# Patient Record
Sex: Female | Born: 1935
Health system: Southern US, Community
[De-identification: ages and names within clinical notes are randomized; demographics above are authoritative.]

## PROBLEM LIST (undated history)

## (undated) DIAGNOSIS — C801 Malignant (primary) neoplasm, unspecified: Secondary | ICD-10-CM

## (undated) DIAGNOSIS — E079 Disorder of thyroid, unspecified: Secondary | ICD-10-CM

## (undated) DIAGNOSIS — C50919 Malignant neoplasm of unspecified site of unspecified female breast: Secondary | ICD-10-CM

## (undated) DIAGNOSIS — H9190 Unspecified hearing loss, unspecified ear: Secondary | ICD-10-CM

## (undated) DIAGNOSIS — IMO0001 Reserved for inherently not codable concepts without codable children: Secondary | ICD-10-CM

## (undated) DIAGNOSIS — H409 Unspecified glaucoma: Secondary | ICD-10-CM

## (undated) DIAGNOSIS — I10 Essential (primary) hypertension: Secondary | ICD-10-CM

## (undated) DIAGNOSIS — I4891 Unspecified atrial fibrillation: Secondary | ICD-10-CM

## (undated) DIAGNOSIS — M8580 Other specified disorders of bone density and structure, unspecified site: Secondary | ICD-10-CM

## (undated) HISTORY — DX: Other specified disorders of bone density and structure, unspecified site: M85.80

## (undated) HISTORY — PX: TONSILLECTOMY: SUR1361

## (undated) HISTORY — PX: BREAST LUMPECTOMY: SHX2

## (undated) HISTORY — DX: Essential (primary) hypertension: I10

## (undated) HISTORY — DX: Disorder of thyroid, unspecified: E07.9

## (undated) HISTORY — DX: Reserved for inherently not codable concepts without codable children: IMO0001

## (undated) HISTORY — DX: Unspecified glaucoma: H40.9

## (undated) HISTORY — DX: Unspecified hearing loss, unspecified ear: H91.90

## (undated) HISTORY — DX: Malignant (primary) neoplasm, unspecified: C80.1

## (undated) HISTORY — PX: BREAST EXCISIONAL BIOPSY: SUR124

## (undated) HISTORY — PX: OTHER SURGICAL HISTORY: SHX169

## (undated) HISTORY — DX: Unspecified atrial fibrillation: I48.91

## (undated) HISTORY — PX: THYROID SURGERY: SHX805

## (undated) HISTORY — PX: APPENDECTOMY: SHX54

---

## 1987-05-26 HISTORY — PX: BREAST SURGERY: SHX581

## 1998-01-30 ENCOUNTER — Other Ambulatory Visit: Admission: RE | Admit: 1998-01-30 | Discharge: 1998-01-30 | Payer: Self-pay | Admitting: Gynecology

## 1999-01-02 ENCOUNTER — Other Ambulatory Visit: Admission: RE | Admit: 1999-01-02 | Discharge: 1999-01-02 | Payer: Self-pay | Admitting: Gynecology

## 1999-07-25 ENCOUNTER — Encounter: Payer: Self-pay | Admitting: Geriatric Medicine

## 1999-07-25 ENCOUNTER — Encounter: Admission: RE | Admit: 1999-07-25 | Discharge: 1999-07-25 | Payer: Self-pay | Admitting: Geriatric Medicine

## 1999-10-06 ENCOUNTER — Encounter: Payer: Self-pay | Admitting: Geriatric Medicine

## 1999-10-06 ENCOUNTER — Encounter: Admission: RE | Admit: 1999-10-06 | Discharge: 1999-10-06 | Payer: Self-pay | Admitting: Geriatric Medicine

## 1999-12-31 ENCOUNTER — Other Ambulatory Visit: Admission: RE | Admit: 1999-12-31 | Discharge: 1999-12-31 | Payer: Self-pay | Admitting: Gynecology

## 2000-01-05 ENCOUNTER — Encounter: Admission: RE | Admit: 2000-01-05 | Discharge: 2000-01-05 | Payer: Self-pay | Admitting: Gynecology

## 2000-01-05 ENCOUNTER — Encounter: Payer: Self-pay | Admitting: Gynecology

## 2000-10-13 ENCOUNTER — Encounter: Payer: Self-pay | Admitting: Geriatric Medicine

## 2000-10-13 ENCOUNTER — Encounter: Admission: RE | Admit: 2000-10-13 | Discharge: 2000-10-13 | Payer: Self-pay | Admitting: Geriatric Medicine

## 2001-01-04 ENCOUNTER — Other Ambulatory Visit: Admission: RE | Admit: 2001-01-04 | Discharge: 2001-01-04 | Payer: Self-pay | Admitting: Gynecology

## 2001-09-28 ENCOUNTER — Encounter: Admission: RE | Admit: 2001-09-28 | Discharge: 2001-09-28 | Payer: Self-pay | Admitting: Geriatric Medicine

## 2001-09-28 ENCOUNTER — Encounter: Payer: Self-pay | Admitting: Geriatric Medicine

## 2002-02-08 ENCOUNTER — Other Ambulatory Visit: Admission: RE | Admit: 2002-02-08 | Discharge: 2002-02-08 | Payer: Self-pay | Admitting: Gynecology

## 2002-10-02 ENCOUNTER — Encounter: Payer: Self-pay | Admitting: Geriatric Medicine

## 2002-10-02 ENCOUNTER — Encounter: Admission: RE | Admit: 2002-10-02 | Discharge: 2002-10-02 | Payer: Self-pay | Admitting: Geriatric Medicine

## 2003-10-03 ENCOUNTER — Ambulatory Visit (HOSPITAL_COMMUNITY): Admission: RE | Admit: 2003-10-03 | Discharge: 2003-10-03 | Payer: Self-pay | Admitting: Geriatric Medicine

## 2004-02-25 ENCOUNTER — Other Ambulatory Visit: Admission: RE | Admit: 2004-02-25 | Discharge: 2004-02-25 | Payer: Self-pay | Admitting: Gynecology

## 2004-10-03 ENCOUNTER — Ambulatory Visit (HOSPITAL_COMMUNITY): Admission: RE | Admit: 2004-10-03 | Discharge: 2004-10-03 | Payer: Self-pay | Admitting: Gynecology

## 2005-10-07 ENCOUNTER — Ambulatory Visit (HOSPITAL_COMMUNITY): Admission: RE | Admit: 2005-10-07 | Discharge: 2005-10-07 | Payer: Self-pay | Admitting: Gynecology

## 2006-03-10 ENCOUNTER — Other Ambulatory Visit: Admission: RE | Admit: 2006-03-10 | Discharge: 2006-03-10 | Payer: Self-pay | Admitting: Gynecology

## 2006-10-11 ENCOUNTER — Ambulatory Visit (HOSPITAL_COMMUNITY): Admission: RE | Admit: 2006-10-11 | Discharge: 2006-10-11 | Payer: Self-pay | Admitting: Geriatric Medicine

## 2007-10-12 ENCOUNTER — Ambulatory Visit (HOSPITAL_COMMUNITY): Admission: RE | Admit: 2007-10-12 | Discharge: 2007-10-12 | Payer: Self-pay | Admitting: Geriatric Medicine

## 2008-10-12 ENCOUNTER — Ambulatory Visit (HOSPITAL_COMMUNITY): Admission: RE | Admit: 2008-10-12 | Discharge: 2008-10-12 | Payer: Self-pay | Admitting: Geriatric Medicine

## 2009-10-14 ENCOUNTER — Ambulatory Visit (HOSPITAL_COMMUNITY): Admission: RE | Admit: 2009-10-14 | Discharge: 2009-10-14 | Payer: Self-pay | Admitting: Gynecology

## 2010-09-09 ENCOUNTER — Other Ambulatory Visit (HOSPITAL_COMMUNITY): Payer: Self-pay | Admitting: Geriatric Medicine

## 2010-09-09 DIAGNOSIS — Z1231 Encounter for screening mammogram for malignant neoplasm of breast: Secondary | ICD-10-CM

## 2010-10-17 ENCOUNTER — Ambulatory Visit (HOSPITAL_COMMUNITY)
Admission: RE | Admit: 2010-10-17 | Discharge: 2010-10-17 | Disposition: A | Discharge status: Home / Self Care | Payer: Medicare Other | Source: Ambulatory Visit | Attending: Geriatric Medicine | Admitting: Geriatric Medicine

## 2010-10-17 DIAGNOSIS — Z1231 Encounter for screening mammogram for malignant neoplasm of breast: Secondary | ICD-10-CM | POA: Insufficient documentation

## 2011-07-08 DIAGNOSIS — Z79899 Other long term (current) drug therapy: Secondary | ICD-10-CM | POA: Diagnosis not present

## 2011-07-08 DIAGNOSIS — I10 Essential (primary) hypertension: Secondary | ICD-10-CM | POA: Diagnosis not present

## 2011-07-08 DIAGNOSIS — E039 Hypothyroidism, unspecified: Secondary | ICD-10-CM | POA: Diagnosis not present

## 2011-09-16 ENCOUNTER — Other Ambulatory Visit (HOSPITAL_COMMUNITY): Payer: Self-pay | Admitting: Geriatric Medicine

## 2011-09-16 DIAGNOSIS — Z1231 Encounter for screening mammogram for malignant neoplasm of breast: Secondary | ICD-10-CM

## 2011-10-21 ENCOUNTER — Ambulatory Visit (HOSPITAL_COMMUNITY)
Admission: RE | Admit: 2011-10-21 | Discharge: 2011-10-21 | Disposition: A | Discharge status: Home / Self Care | Payer: Medicare Other | Source: Ambulatory Visit | Attending: Geriatric Medicine | Admitting: Geriatric Medicine

## 2011-10-21 DIAGNOSIS — Z1231 Encounter for screening mammogram for malignant neoplasm of breast: Secondary | ICD-10-CM | POA: Insufficient documentation

## 2012-01-08 DIAGNOSIS — H903 Sensorineural hearing loss, bilateral: Secondary | ICD-10-CM | POA: Diagnosis not present

## 2012-01-13 DIAGNOSIS — Z1331 Encounter for screening for depression: Secondary | ICD-10-CM | POA: Diagnosis not present

## 2012-01-13 DIAGNOSIS — G479 Sleep disorder, unspecified: Secondary | ICD-10-CM | POA: Diagnosis not present

## 2012-01-13 DIAGNOSIS — E039 Hypothyroidism, unspecified: Secondary | ICD-10-CM | POA: Diagnosis not present

## 2012-01-13 DIAGNOSIS — Z0000 Encounter for general adult medical examination without abnormal findings: Secondary | ICD-10-CM | POA: Diagnosis not present

## 2012-01-13 DIAGNOSIS — M79609 Pain in unspecified limb: Secondary | ICD-10-CM | POA: Diagnosis not present

## 2012-01-13 DIAGNOSIS — Z79899 Other long term (current) drug therapy: Secondary | ICD-10-CM | POA: Diagnosis not present

## 2012-03-23 DIAGNOSIS — Z01419 Encounter for gynecological examination (general) (routine) without abnormal findings: Secondary | ICD-10-CM | POA: Diagnosis not present

## 2012-03-23 DIAGNOSIS — N8184 Pelvic muscle wasting: Secondary | ICD-10-CM | POA: Diagnosis not present

## 2012-03-23 DIAGNOSIS — Z23 Encounter for immunization: Secondary | ICD-10-CM | POA: Diagnosis not present

## 2012-07-13 DIAGNOSIS — L989 Disorder of the skin and subcutaneous tissue, unspecified: Secondary | ICD-10-CM | POA: Diagnosis not present

## 2012-07-13 DIAGNOSIS — I10 Essential (primary) hypertension: Secondary | ICD-10-CM | POA: Diagnosis not present

## 2012-07-13 DIAGNOSIS — Z79899 Other long term (current) drug therapy: Secondary | ICD-10-CM | POA: Diagnosis not present

## 2012-09-14 ENCOUNTER — Other Ambulatory Visit (HOSPITAL_COMMUNITY): Payer: Self-pay | Admitting: Geriatric Medicine

## 2012-09-14 DIAGNOSIS — Z1231 Encounter for screening mammogram for malignant neoplasm of breast: Secondary | ICD-10-CM

## 2012-10-21 ENCOUNTER — Ambulatory Visit (HOSPITAL_COMMUNITY)
Admission: RE | Admit: 2012-10-21 | Discharge: 2012-10-21 | Disposition: A | Discharge status: Home / Self Care | Payer: Medicare Other | Source: Ambulatory Visit | Attending: Geriatric Medicine | Admitting: Geriatric Medicine

## 2012-10-21 DIAGNOSIS — Z1231 Encounter for screening mammogram for malignant neoplasm of breast: Secondary | ICD-10-CM | POA: Diagnosis not present

## 2012-12-22 DIAGNOSIS — H3530 Unspecified macular degeneration: Secondary | ICD-10-CM | POA: Diagnosis not present

## 2013-01-13 DIAGNOSIS — Z0000 Encounter for general adult medical examination without abnormal findings: Secondary | ICD-10-CM | POA: Diagnosis not present

## 2013-01-13 DIAGNOSIS — I10 Essential (primary) hypertension: Secondary | ICD-10-CM | POA: Diagnosis not present

## 2013-01-13 DIAGNOSIS — Z1331 Encounter for screening for depression: Secondary | ICD-10-CM | POA: Diagnosis not present

## 2013-01-13 DIAGNOSIS — E039 Hypothyroidism, unspecified: Secondary | ICD-10-CM | POA: Diagnosis not present

## 2013-01-13 DIAGNOSIS — L821 Other seborrheic keratosis: Secondary | ICD-10-CM | POA: Diagnosis not present

## 2013-01-13 DIAGNOSIS — M6530 Trigger finger, unspecified finger: Secondary | ICD-10-CM | POA: Diagnosis not present

## 2013-01-13 DIAGNOSIS — Z79899 Other long term (current) drug therapy: Secondary | ICD-10-CM | POA: Diagnosis not present

## 2013-01-13 DIAGNOSIS — E781 Pure hyperglyceridemia: Secondary | ICD-10-CM | POA: Diagnosis not present

## 2013-03-24 ENCOUNTER — Telehealth: Payer: Self-pay | Admitting: *Deleted

## 2013-03-24 ENCOUNTER — Encounter: Payer: Self-pay | Admitting: Gynecology

## 2013-03-24 ENCOUNTER — Ambulatory Visit (INDEPENDENT_AMBULATORY_CARE_PROVIDER_SITE_OTHER): Payer: Medicare Other | Admitting: Gynecology

## 2013-03-24 VITALS — BP 124/78 | Ht 66.0 in | Wt 161.0 lb

## 2013-03-24 DIAGNOSIS — E039 Hypothyroidism, unspecified: Secondary | ICD-10-CM

## 2013-03-24 DIAGNOSIS — I10 Essential (primary) hypertension: Secondary | ICD-10-CM

## 2013-03-24 DIAGNOSIS — C50919 Malignant neoplasm of unspecified site of unspecified female breast: Secondary | ICD-10-CM | POA: Diagnosis not present

## 2013-03-24 DIAGNOSIS — N952 Postmenopausal atrophic vaginitis: Secondary | ICD-10-CM | POA: Diagnosis not present

## 2013-03-24 DIAGNOSIS — Z23 Encounter for immunization: Secondary | ICD-10-CM

## 2013-03-24 DIAGNOSIS — C50911 Malignant neoplasm of unspecified site of right female breast: Secondary | ICD-10-CM

## 2013-03-24 NOTE — Patient Instructions (Signed)
Follow up in one year for annual exam 

## 2013-03-24 NOTE — Telephone Encounter (Signed)
error 

## 2013-03-24 NOTE — Progress Notes (Signed)
Laura Lee 1931/07/13 [id number]        79 y.o.  [id number] new patient for followup exam.  Former patient of Dr. Nicholas Lose. Several issues noted below.  Past medical history,surgical history, problem list, medications, allergies, family history and social history were all reviewed and documented in the EPIC chart.  ROS:  Performed and pertinent positives and negatives are included in the history, assessment and plan .  Exam: Kim assistant Filed Vitals:   03/24/13 0959  BP: 124/78  Height: 5\' 6"  (1.676 m)  Weight: 161 lb (73.029 kg)   General appearance  Normal Skin grossly normal Head/Neck normal with no cervical or supraclavicular adenopathy thyroid normal Lungs  clear Cardiac RR, without RMG Abdominal  soft, nontender, without masses, organomegaly or hernia Breasts  examined lying and sitting without masses, retractions, discharge or axillary adenopathy. Pelvic  Ext/BUS/vagina  atrophic changes   Cervix  normal with atrophic changes  Uterus  anteverted, normal size, shape and contour, midline and mobile nontender   Adnexa  Without masses or tenderness    Anus and perineum  normal   Rectovaginal  normal sphincter tone without palpated masses or tenderness.    Assessment/Plan:  79 y.o. [id number] new patient for followup exam.   1. Postmenopausal/atrophic genital changes.  Had significant vaginal dryness and irritation. Was started on Vagifem several years ago by Dr. Nicholas Lose with good response. I reviewed her history of breast cancer status post right lumpectomy and radiation 1989. The issues of estrogen and breast cancer survivors and possible stimulation of metastatic disease or denovo new breast cancer with hormone use reviewed. The issues of vaginal estrogen with limited absorption also discussed.  The women's health initiative with risks of stroke heart attack DVT and breast cancer discussed. Disclaimer that I cannot guarantee it she continues on Vagifem this will not lead to a higher  risk of recurrent or new breast cancer reviewed. Patient notes it is a quality of life issue and she feels much better on the Vagifem and wants to continue. She accepts the above risks and I provided her with samples and she is going to call when they run out for a refill. 2. Mammography 09/2012. Continue with annual mammography. SBE monthly reviewed. 3. Pap smear reported 2013. No Pap smear done today. No history of abnormal Pap smears previously. Discussed current screening guidelines we both agree unstop screening. 4. DEXA reported several years ago. Was told that it was "normal". Will obtain a copy of this. Increase calcium vitamin D reviewed. 5. Colonoscopy 2012. Repeat at their recommended interval. 6. Health maintenance. No lab work done as it was all recently done through Dr. Laverle Hobby office. Followup one year, sooner as needed.  Note: This document was prepared with digital dictation and possible smart phrase technology. Any transcriptional errors that result from this process are unintentional.   Dara Lords MD, 10:52 AM 03/24/2013

## 2013-03-29 ENCOUNTER — Encounter: Payer: Self-pay | Admitting: Gynecology

## 2013-03-29 ENCOUNTER — Telehealth: Payer: Self-pay | Admitting: Gynecology

## 2013-03-29 DIAGNOSIS — M8580 Other specified disorders of bone density and structure, unspecified site: Secondary | ICD-10-CM

## 2013-03-29 NOTE — Telephone Encounter (Signed)
Pt informed with the below note, order placed, front desk will schedule.

## 2013-03-29 NOTE — Telephone Encounter (Signed)
Tell patient I received a copy of her bone density from Dr. Nicholas Lose office. It does show osteopenia. Was done 2011. Recommend repeat a DEXA now at our office and schedule this for her.

## 2013-05-25 DIAGNOSIS — M8580 Other specified disorders of bone density and structure, unspecified site: Secondary | ICD-10-CM

## 2013-05-25 HISTORY — DX: Other specified disorders of bone density and structure, unspecified site: M85.80

## 2013-06-06 ENCOUNTER — Ambulatory Visit (INDEPENDENT_AMBULATORY_CARE_PROVIDER_SITE_OTHER): Payer: Medicare Other

## 2013-06-06 DIAGNOSIS — M8580 Other specified disorders of bone density and structure, unspecified site: Secondary | ICD-10-CM

## 2013-06-06 DIAGNOSIS — M899 Disorder of bone, unspecified: Secondary | ICD-10-CM | POA: Diagnosis not present

## 2013-06-06 DIAGNOSIS — M949 Disorder of cartilage, unspecified: Secondary | ICD-10-CM

## 2013-06-07 ENCOUNTER — Telehealth: Payer: Self-pay | Admitting: Gynecology

## 2013-06-07 ENCOUNTER — Encounter: Payer: Self-pay | Admitting: Gynecology

## 2013-06-07 NOTE — Telephone Encounter (Signed)
Tell patient that her bone density appears to be stable from prior study but when we do the fracture risk assessment calculation she does appear to be at a higher risk of fracture at the hip that would warrant consideration for treatment. Recommend office visit to discuss treatment options.

## 2013-06-07 NOTE — Telephone Encounter (Signed)
Pt informed with the below note, transferred to front desk to schedule. 

## 2013-06-08 ENCOUNTER — Encounter: Payer: Self-pay | Admitting: Gynecology

## 2013-06-08 ENCOUNTER — Ambulatory Visit (INDEPENDENT_AMBULATORY_CARE_PROVIDER_SITE_OTHER): Payer: Medicare Other | Admitting: Gynecology

## 2013-06-08 DIAGNOSIS — M949 Disorder of cartilage, unspecified: Secondary | ICD-10-CM

## 2013-06-08 DIAGNOSIS — M899 Disorder of bone, unspecified: Secondary | ICD-10-CM | POA: Diagnosis not present

## 2013-06-08 DIAGNOSIS — M8580 Other specified disorders of bone density and structure, unspecified site: Secondary | ICD-10-CM

## 2013-06-08 NOTE — Patient Instructions (Signed)
Followup for a vitamin D level, otherwise annually

## 2013-06-08 NOTE — Progress Notes (Signed)
Patient presents to discuss her most recent DEXA results. T score of -1.9 FRAX 15%/4.3%. Reviewed increased risk of fracture at the hip exceeding 3% threshold for treatment. Options for treatment reviewed to include bisphosphonates with issues discussed to include GERD, osteonecrosis of the jaw and atypical fractures. How to take the medication and side effect profile reviewed. Risks of fracture with devastating sequelae discussed. After a lengthy discussion the patient declines treatment understanding the issues and risks and prefers to monitor with repeat DEXA at 2 years. Modifiable factors to include increasing weightbearing exercise reviewed. We'll go ahead and check baseline vitamin D level today also as supplementation may be warranted. Followup if she has any further questions otherwise when she is due for her annual exam the end of this coming year. I did give her a copy of her DEXA report.

## 2013-06-09 LAB — VITAMIN D 25 HYDROXY (VIT D DEFICIENCY, FRACTURES): Vit D, 25-Hydroxy: 76 ng/mL (ref 30–89)

## 2013-06-13 ENCOUNTER — Telehealth: Payer: Self-pay | Admitting: *Deleted

## 2013-06-13 MED ORDER — ALENDRONATE SODIUM 70 MG PO TABS: 70.0000 mg | ORAL_TABLET | ORAL | Status: DC

## 2013-06-13 NOTE — Telephone Encounter (Signed)
Pt informed with the below note, rx sent. 

## 2013-06-13 NOTE — Telephone Encounter (Signed)
Pt said at [facility] on 06/08/13 you spoke with her about starting Fosamax and she declined, pt had decided she would like to start medication now. Please advise

## 2013-06-13 NOTE — Telephone Encounter (Signed)
Fosamax 70 mg weekly #4 refill x11

## 2013-07-25 DIAGNOSIS — M949 Disorder of cartilage, unspecified: Secondary | ICD-10-CM | POA: Diagnosis not present

## 2013-07-25 DIAGNOSIS — L659 Nonscarring hair loss, unspecified: Secondary | ICD-10-CM | POA: Diagnosis not present

## 2013-07-25 DIAGNOSIS — Z79899 Other long term (current) drug therapy: Secondary | ICD-10-CM | POA: Diagnosis not present

## 2013-07-25 DIAGNOSIS — M899 Disorder of bone, unspecified: Secondary | ICD-10-CM | POA: Diagnosis not present

## 2013-07-25 DIAGNOSIS — I10 Essential (primary) hypertension: Secondary | ICD-10-CM | POA: Diagnosis not present

## 2013-09-25 ENCOUNTER — Other Ambulatory Visit (HOSPITAL_COMMUNITY): Payer: Self-pay | Admitting: Geriatric Medicine

## 2013-09-25 DIAGNOSIS — Z1231 Encounter for screening mammogram for malignant neoplasm of breast: Secondary | ICD-10-CM

## 2013-10-23 ENCOUNTER — Ambulatory Visit (HOSPITAL_COMMUNITY)
Admission: RE | Admit: 2013-10-23 | Discharge: 2013-10-23 | Disposition: A | Discharge status: Home / Self Care | Payer: Medicare Other | Source: Ambulatory Visit | Attending: Geriatric Medicine | Admitting: Geriatric Medicine

## 2013-10-23 DIAGNOSIS — Z1231 Encounter for screening mammogram for malignant neoplasm of breast: Secondary | ICD-10-CM | POA: Insufficient documentation

## 2013-11-01 DIAGNOSIS — E039 Hypothyroidism, unspecified: Secondary | ICD-10-CM | POA: Diagnosis not present

## 2013-11-01 DIAGNOSIS — I10 Essential (primary) hypertension: Secondary | ICD-10-CM | POA: Diagnosis not present

## 2013-12-28 DIAGNOSIS — H3530 Unspecified macular degeneration: Secondary | ICD-10-CM | POA: Diagnosis not present

## 2014-01-10 DIAGNOSIS — H409 Unspecified glaucoma: Secondary | ICD-10-CM | POA: Diagnosis not present

## 2014-01-17 DIAGNOSIS — Z79899 Other long term (current) drug therapy: Secondary | ICD-10-CM | POA: Diagnosis not present

## 2014-01-17 DIAGNOSIS — E039 Hypothyroidism, unspecified: Secondary | ICD-10-CM | POA: Diagnosis not present

## 2014-01-17 DIAGNOSIS — I10 Essential (primary) hypertension: Secondary | ICD-10-CM | POA: Diagnosis not present

## 2014-01-17 DIAGNOSIS — L989 Disorder of the skin and subcutaneous tissue, unspecified: Secondary | ICD-10-CM | POA: Diagnosis not present

## 2014-01-17 DIAGNOSIS — I8390 Asymptomatic varicose veins of unspecified lower extremity: Secondary | ICD-10-CM | POA: Diagnosis not present

## 2014-01-17 DIAGNOSIS — Z0000 Encounter for general adult medical examination without abnormal findings: Secondary | ICD-10-CM | POA: Diagnosis not present

## 2014-01-17 DIAGNOSIS — Z1331 Encounter for screening for depression: Secondary | ICD-10-CM | POA: Diagnosis not present

## 2014-01-25 DIAGNOSIS — L820 Inflamed seborrheic keratosis: Secondary | ICD-10-CM | POA: Diagnosis not present

## 2014-01-25 DIAGNOSIS — L821 Other seborrheic keratosis: Secondary | ICD-10-CM | POA: Diagnosis not present

## 2014-01-31 DIAGNOSIS — H401290 Low-tension glaucoma, unspecified eye, stage unspecified: Secondary | ICD-10-CM | POA: Diagnosis not present

## 2014-02-28 DIAGNOSIS — H401224 Low-tension glaucoma, left eye, indeterminate stage: Secondary | ICD-10-CM | POA: Diagnosis not present

## 2014-03-26 ENCOUNTER — Encounter: Payer: Self-pay | Admitting: Gynecology

## 2014-03-27 ENCOUNTER — Ambulatory Visit (INDEPENDENT_AMBULATORY_CARE_PROVIDER_SITE_OTHER): Payer: Medicare Other | Admitting: Gynecology

## 2014-03-27 ENCOUNTER — Encounter: Payer: Self-pay | Admitting: Gynecology

## 2014-03-27 VITALS — BP 130/80 | Ht 66.0 in | Wt 158.0 lb

## 2014-03-27 DIAGNOSIS — M8580 Other specified disorders of bone density and structure, unspecified site: Secondary | ICD-10-CM

## 2014-03-27 DIAGNOSIS — R8290 Unspecified abnormal findings in urine: Secondary | ICD-10-CM | POA: Diagnosis not present

## 2014-03-27 DIAGNOSIS — N952 Postmenopausal atrophic vaginitis: Secondary | ICD-10-CM

## 2014-03-27 DIAGNOSIS — N898 Other specified noninflammatory disorders of vagina: Secondary | ICD-10-CM | POA: Diagnosis not present

## 2014-03-27 NOTE — Patient Instructions (Signed)
Follow up for vaginal cyst biopsy as scheduled.  You may obtain a copy of any labs that were done today by logging onto MyChart as outlined in the instructions provided with your AVS (after visit summary). The office will not call with normal lab results but certainly if there are any significant abnormalities then we will contact you.   Health Maintenance, Female A healthy lifestyle and preventative care can promote health and wellness.  Maintain regular health, dental, and eye exams.  Eat a healthy diet. Foods like vegetables, fruits, whole grains, low-fat dairy products, and lean protein foods contain the nutrients you need without too many calories. Decrease your intake of foods high in solid fats, added sugars, and salt. Get information about a proper diet from your caregiver, if necessary.  Regular physical exercise is one of the most important things you can do for your health. Most adults should get at least 150 minutes of moderate-intensity exercise (any activity that increases your heart rate and causes you to sweat) each week. In addition, most adults need muscle-strengthening exercises on 2 or more days a week.   Maintain a healthy weight. The body mass index (BMI) is a screening tool to identify possible weight problems. It provides an estimate of body fat based on height and weight. Your caregiver can help determine your BMI, and can help you achieve or maintain a healthy weight. For adults 20 years and older:  A BMI below 18.5 is considered underweight.  A BMI of 18.5 to 24.9 is normal.  A BMI of 25 to 29.9 is considered overweight.  A BMI of 30 and above is considered obese.  Maintain normal blood lipids and cholesterol by exercising and minimizing your intake of saturated fat. Eat a balanced diet with plenty of fruits and vegetables. Blood tests for lipids and cholesterol should begin at age 4 and be repeated every 5 years. If your lipid or cholesterol levels are high, you  are over 50, or you are a high risk for heart disease, you may need your cholesterol levels checked more frequently.Ongoing high lipid and cholesterol levels should be treated with medicines if diet and exercise are not effective.  If you smoke, find out from your caregiver how to quit. If you do not use tobacco, do not start.  Lung cancer screening is recommended for adults aged 62 80 years who are at high risk for developing lung cancer because of a history of smoking. Yearly low-dose computed tomography (CT) is recommended for people who have at least a 30-pack-year history of smoking and are a current smoker or have quit within the past 15 years. A pack year of smoking is smoking an average of 1 pack of cigarettes a day for 1 year (for example: 1 pack a day for 30 years or 2 packs a day for 15 years). Yearly screening should continue until the smoker has stopped smoking for at least 15 years. Yearly screening should also be stopped for people who develop a health problem that would prevent them from having lung cancer treatment.  If you are pregnant, do not drink alcohol. If you are breastfeeding, be very cautious about drinking alcohol. If you are not pregnant and choose to drink alcohol, do not exceed 1 drink per day. One drink is considered to be 12 ounces (355 mL) of beer, 5 ounces (148 mL) of wine, or 1.5 ounces (44 mL) of liquor.  Avoid use of street drugs. Do not share needles with anyone. Ask for  help if you need support or instructions about stopping the use of drugs.  High blood pressure causes heart disease and increases the risk of stroke. Blood pressure should be checked at least every 1 to 2 years. Ongoing high blood pressure should be treated with medicines, if weight loss and exercise are not effective.  If you are 94 to 78 years old, ask your caregiver if you should take aspirin to prevent strokes.  Diabetes screening involves taking a blood sample to check your fasting blood  sugar level. This should be done once every 3 years, after age 56, if you are within normal weight and without risk factors for diabetes. Testing should be considered at a younger age or be carried out more frequently if you are overweight and have at least 1 risk factor for diabetes.  Breast cancer screening is essential preventative care for women. You should practice "breast self-awareness." This means understanding the normal appearance and feel of your breasts and may include breast self-examination. Any changes detected, no matter how small, should be reported to a caregiver. Women in their 40s and 30s should have a clinical breast exam (CBE) by a caregiver as part of a regular health exam every 1 to 3 years. After age 59, women should have a CBE every year. Starting at age 18, women should consider having a mammogram (breast X-ray) every year. Women who have a family history of breast cancer should talk to their caregiver about genetic screening. Women at a high risk of breast cancer should talk to their caregiver about having an MRI and a mammogram every year.  Breast cancer gene (BRCA)-related cancer risk assessment is recommended for women who have family members with BRCA-related cancers. BRCA-related cancers include breast, ovarian, tubal, and peritoneal cancers. Having family members with these cancers may be associated with an increased risk for harmful changes (mutations) in the breast cancer genes BRCA1 and BRCA2. Results of the assessment will determine the need for genetic counseling and BRCA1 and BRCA2 testing.  The Pap test is a screening test for cervical cancer. Women should have a Pap test starting at age 33. Between ages 59 and 29, Pap tests should be repeated every 2 years. Beginning at age 11, you should have a Pap test every 3 years as long as the past 3 Pap tests have been normal. If you had a hysterectomy for a problem that was not cancer or a condition that could lead to cancer,  then you no longer need Pap tests. If you are between ages 67 and 28, and you have had normal Pap tests going back 10 years, you no longer need Pap tests. If you have had past treatment for cervical cancer or a condition that could lead to cancer, you need Pap tests and screening for cancer for at least 20 years after your treatment. If Pap tests have been discontinued, risk factors (such as a new sexual partner) need to be reassessed to determine if screening should be resumed. Some women have medical problems that increase the chance of getting cervical cancer. In these cases, your caregiver may recommend more frequent screening and Pap tests.  The human papillomavirus (HPV) test is an additional test that may be used for cervical cancer screening. The HPV test looks for the virus that can cause the cell changes on the cervix. The cells collected during the Pap test can be tested for HPV. The HPV test could be used to screen women aged 17 years and older,  and should be used in women of any age who have unclear Pap test results. After the age of 71, women should have HPV testing at the same frequency as a Pap test.  Colorectal cancer can be detected and often prevented. Most routine colorectal cancer screening begins at the age of 45 and continues through age 32. However, your caregiver may recommend screening at an earlier age if you have risk factors for colon cancer. On a yearly basis, your caregiver may provide home test kits to check for hidden blood in the stool. Use of a small camera at the end of a tube, to directly examine the colon (sigmoidoscopy or colonoscopy), can detect the earliest forms of colorectal cancer. Talk to your caregiver about this at age 13, when routine screening begins. Direct examination of the colon should be repeated every 5 to 10 years through age 30, unless early forms of pre-cancerous polyps or small growths are found.  Hepatitis C blood testing is recommended for all people  born from [birth year] through 1965 and any individual with known risks for hepatitis C.  Practice safe sex. Use condoms and avoid high-risk sexual practices to reduce the spread of sexually transmitted infections (STIs). Sexually active women aged 17 and younger should be checked for Chlamydia, which is a common sexually transmitted infection. Older women with new or multiple partners should also be tested for Chlamydia. Testing for other STIs is recommended if you are sexually active and at increased risk.  Osteoporosis is a disease in which the bones lose minerals and strength with aging. This can result in serious bone fractures. The risk of osteoporosis can be identified using a bone density scan. Women ages 19 and over and women at risk for fractures or osteoporosis should discuss screening with their caregivers. Ask your caregiver whether you should be taking a calcium supplement or vitamin D to reduce the rate of osteoporosis.  Menopause can be associated with physical symptoms and risks. Hormone replacement therapy is available to decrease symptoms and risks. You should talk to your caregiver about whether hormone replacement therapy is right for you.  Use sunscreen. Apply sunscreen liberally and repeatedly throughout the day. You should seek shade when your shadow is shorter than you. Protect yourself by wearing long sleeves, pants, a wide-brimmed hat, and sunglasses year round, whenever you are outdoors.  Notify your caregiver of new moles or changes in moles, especially if there is a change in shape or color. Also notify your caregiver if a mole is larger than the size of a pencil eraser.  Stay current with your immunizations. Document Released: 11/24/2010 Document Revised: 09/05/2012 Document Reviewed: 11/24/2010 [facility] Patient Information 2014 [place].

## 2014-03-27 NOTE — Progress Notes (Signed)
Laura Lee 1932/02/24 [id number]        80 y.o.  [id number] for follow up exam. Several issues noted below  Past medical history,surgical history, problem list, medications, allergies, family history and social history were all reviewed and documented as reviewed in the EPIC chart.  ROS:  12 system ROS performed with pertinent positives and negatives included in the history, assessment and plan.   Additional significant findings :  none   Exam: Kim Counsellor Vitals:   03/27/14 0858  BP: 130/80  Height: 5\' 6"  (1.676 m)  Weight: 158 lb (71.668 kg)   General appearance:  Normal affect, orientation and appearance. Skin: Grossly normal HEENT: Without gross lesions.  No cervical or supraclavicular adenopathy. Thyroid normal.  Lungs:  Clear without wheezing, rales or rhonchi Cardiac: RR, without RMG Abdominal:  Soft, nontender, without masses, guarding, rebound, organomegaly or hernia Breasts:  Examined lying and sitting without masses, retractions, discharge or axillary adenopathy.  Well-healed right lumpectomy scar Pelvic:  Ext/BUS/vagina with generalized atrophic changes. Cyst like mass generous 1 cm size, 6:00 position at the junction of vagina and cervix. Difficult to see if it is pedunculated. Physical Exam  Genitourinary:       Cervix atrophic in appearance  Uterus anteverted, normal size, shape and contour, midline and mobile nontender   Adnexa  Without masses or tenderness    Anus and perineum  Normal   Rectovaginal  Normal sphincter tone without palpated masses or tenderness.    Assessment/Plan:  80 y.o. [id number] female for follow up exam.   1. Vaginal/cervical cyst as described above. New finding on exam. Recommend colposcopy appointment with biopsy/excision. Patient agrees to schedule. 2. Postmenopausal/atrophic genital changes. Patient without significant symptoms of hot flashes, night sweats or any vaginal bleeding. Has used Vagifem for vaginal dryness. History  of breast cancer. We've reviewed the issues of absorption and stimulation of metastatic disease potential and the potential controversial issues of using estrogen in any form in a patient who is a breast cancer survivor. She clearly understands the issues and has always been comfortable using. No prescription provided at this time. Will call if she needs more. 3. Osteopenia.  DEXA 05/2013 T score -1.9 FRAX 15%/4.3%. Reviewed with patient 06/08/2013 office note and she declined treatment at that time.  Check vitamin D level today. Calcium/vitamin D recommendations reviewed. 4. Pap smear 2013. No Pap smear done today. No history of abnormal Pap smears previously.  Reviewed options to stop screening altogether and she is over the age of 55 versus less frequent screening intervals. Will readdress on an annual basis. 5. History of breast cancer status post lumpectomy. Exam an NED. Mammography 10/2013. Continue with annual mammography. SBE monthly reviewed. 6. Colonoscopy 2012. Repeat at their recommended interval. 7. Health maintenance. No routine blood work done that she has this done at Dr. Carlyle Lipa office. Follow up for her vaginal cyst biopsy/colposcopy appointment.     Anastasio Auerbach MD, 9:21 AM 03/27/2014

## 2014-03-28 LAB — URINALYSIS W MICROSCOPIC + REFLEX CULTURE
Bilirubin Urine: NEGATIVE
Casts: NONE SEEN
Crystals: NONE SEEN
Glucose, UA: NEGATIVE mg/dL
Hgb urine dipstick: NEGATIVE
Ketones, ur: NEGATIVE mg/dL
Nitrite: NEGATIVE
Protein, ur: NEGATIVE mg/dL
Specific Gravity, Urine: 1.008 (ref 1.005–1.030)
Squamous Epithelial / LPF: NONE SEEN
Urobilinogen, UA: 0.2 mg/dL (ref 0.0–1.0)
pH: 8 (ref 5.0–8.0)

## 2014-03-28 LAB — VITAMIN D 25 HYDROXY (VIT D DEFICIENCY, FRACTURES): Vit D, 25-Hydroxy: 79 ng/mL (ref 30–89)

## 2014-03-29 DIAGNOSIS — Z23 Encounter for immunization: Secondary | ICD-10-CM | POA: Diagnosis not present

## 2014-03-29 LAB — URINE CULTURE: Colony Count: >=100000

## 2014-06-01 ENCOUNTER — Encounter: Payer: Self-pay | Admitting: Gynecology

## 2014-06-01 ENCOUNTER — Ambulatory Visit (INDEPENDENT_AMBULATORY_CARE_PROVIDER_SITE_OTHER): Payer: Medicare Other | Admitting: Gynecology

## 2014-06-01 DIAGNOSIS — N898 Other specified noninflammatory disorders of vagina: Secondary | ICD-10-CM | POA: Diagnosis not present

## 2014-06-01 NOTE — Progress Notes (Signed)
Laura Lee Apr 10, 1932 [id number]        81 y.o.  [id number] Resents for colposcopy with recent exam showing a cervical/vaginal cystic mass.  Patient is asymptomatic without bleeding or pain. Never noticed before.  Past medical history,surgical history, problem list, medications, allergies, family history and social history were all reviewed and documented in the EPIC chart.  Directed ROS with pertinent positives and negatives documented in the history of present illness/assessment and plan.  Exam: Kim assistant General appearance:  Normal External BUS vagina with atrophic changes. Cystic mass upper left cervicovaginal junction. Uterus grossly normal size midline mobile nontender. Adnexa without masses  Colposcopy after acetic acid cleanse is inadequate. Cervical os is stenotic without transformation zone visualized. No abnormalities seen. Cystic 1 cm mass upper left vagina at the junction with the cervix. Mucosal covering smooth.  Procedure: The skin overlying the dome of the mass was infiltrated with 1% lidocaine and 2 biopsies were taken which subsequently revealed the cyst to be filled with mucoid material. The cyst totally deflated with no further drainage. Silver nitrate hemostasis applied to the edge of the biopsy site. Both biopsy specimens sent to pathology.  Assessment/Plan:  81 y.o. [id number] with mucus vaginal cyst upper left vagina biopsied and deflated. Patient will follow up for biopsy results. Assuming benign then follow expectantly.     Anastasio Auerbach MD, 9:41 AM 06/01/2014

## 2014-06-01 NOTE — Patient Instructions (Signed)
Office will call you with biopsy results 

## 2014-07-18 DIAGNOSIS — I10 Essential (primary) hypertension: Secondary | ICD-10-CM | POA: Diagnosis not present

## 2014-08-29 DIAGNOSIS — H401224 Low-tension glaucoma, left eye, indeterminate stage: Secondary | ICD-10-CM | POA: Diagnosis not present

## 2014-09-18 ENCOUNTER — Other Ambulatory Visit (HOSPITAL_COMMUNITY): Payer: Self-pay | Admitting: Geriatric Medicine

## 2014-09-18 DIAGNOSIS — Z1231 Encounter for screening mammogram for malignant neoplasm of breast: Secondary | ICD-10-CM

## 2014-09-26 DIAGNOSIS — H401224 Low-tension glaucoma, left eye, indeterminate stage: Secondary | ICD-10-CM | POA: Diagnosis not present

## 2014-10-25 ENCOUNTER — Ambulatory Visit (HOSPITAL_COMMUNITY)
Admission: RE | Admit: 2014-10-25 | Discharge: 2014-10-25 | Disposition: A | Discharge status: Home / Self Care | Payer: Medicare Other | Source: Ambulatory Visit | Attending: Geriatric Medicine | Admitting: Geriatric Medicine

## 2014-10-25 DIAGNOSIS — Z1231 Encounter for screening mammogram for malignant neoplasm of breast: Secondary | ICD-10-CM | POA: Diagnosis not present

## 2015-01-04 DIAGNOSIS — H3532 Exudative age-related macular degeneration: Secondary | ICD-10-CM | POA: Diagnosis not present

## 2015-01-21 DIAGNOSIS — Z1389 Encounter for screening for other disorder: Secondary | ICD-10-CM | POA: Diagnosis not present

## 2015-01-21 DIAGNOSIS — I10 Essential (primary) hypertension: Secondary | ICD-10-CM | POA: Diagnosis not present

## 2015-01-21 DIAGNOSIS — E039 Hypothyroidism, unspecified: Secondary | ICD-10-CM | POA: Diagnosis not present

## 2015-01-21 DIAGNOSIS — M1711 Unilateral primary osteoarthritis, right knee: Secondary | ICD-10-CM | POA: Diagnosis not present

## 2015-01-21 DIAGNOSIS — Z79899 Other long term (current) drug therapy: Secondary | ICD-10-CM | POA: Diagnosis not present

## 2015-01-21 DIAGNOSIS — R079 Chest pain, unspecified: Secondary | ICD-10-CM | POA: Diagnosis not present

## 2015-01-21 DIAGNOSIS — M25561 Pain in right knee: Secondary | ICD-10-CM | POA: Diagnosis not present

## 2015-01-21 DIAGNOSIS — Z0000 Encounter for general adult medical examination without abnormal findings: Secondary | ICD-10-CM | POA: Diagnosis not present

## 2015-01-25 ENCOUNTER — Other Ambulatory Visit (HOSPITAL_COMMUNITY): Payer: Self-pay | Admitting: Geriatric Medicine

## 2015-01-25 DIAGNOSIS — R079 Chest pain, unspecified: Secondary | ICD-10-CM

## 2015-01-29 ENCOUNTER — Telehealth (HOSPITAL_COMMUNITY): Payer: Self-pay

## 2015-01-29 NOTE — Telephone Encounter (Signed)
Patient given detailed instructions per Myocardial Perfusion Study Information Sheet for test on 01-31-2015 at 1145. Patient notified to arrive 15 minutes early and that it is imperative to arrive on time for appointment to keep from having the test rescheduled.  If you need to cancel or reschedule your appointment, please call the office within 24 hours of your appointment. Failure to do so may result in a cancellation of your appointment, and a $50 no show fee. Patient verbalized understanding. Oletta Lamas, Daziah Hesler A

## 2015-01-31 ENCOUNTER — Ambulatory Visit (HOSPITAL_COMMUNITY): Payer: Medicare Other | Attending: Cardiovascular Disease

## 2015-01-31 DIAGNOSIS — R079 Chest pain, unspecified: Secondary | ICD-10-CM | POA: Insufficient documentation

## 2015-01-31 DIAGNOSIS — I10 Essential (primary) hypertension: Secondary | ICD-10-CM | POA: Diagnosis not present

## 2015-01-31 DIAGNOSIS — R002 Palpitations: Secondary | ICD-10-CM | POA: Insufficient documentation

## 2015-01-31 LAB — MYOCARDIAL PERFUSION IMAGING
LV dias vol: 96 mL
LV sys vol: 38 mL
Peak HR: 114 {beats}/min
RATE: 0.31
Rest HR: 79 {beats}/min
SDS: 1
SRS: 6
SSS: 5
TID: 1.01

## 2015-01-31 MED ORDER — TECHNETIUM TC 99M SESTAMIBI GENERIC - CARDIOLITE
33.0000 | Freq: Once | INTRAVENOUS | Status: AC | PRN
  Administered 2015-01-31: 33 via INTRAVENOUS

## 2015-01-31 MED ORDER — TECHNETIUM TC 99M SESTAMIBI GENERIC - CARDIOLITE
10.8000 | Freq: Once | INTRAVENOUS | Status: AC | PRN
  Administered 2015-01-31: 11 via INTRAVENOUS

## 2015-01-31 MED ORDER — REGADENOSON 0.4 MG/5ML IV SOLN
0.4000 mg | Freq: Once | INTRAVENOUS | Status: AC
  Administered 2015-01-31: 0.4 mg via INTRAVENOUS

## 2015-02-01 ENCOUNTER — Encounter (HOSPITAL_COMMUNITY): Payer: Self-pay | Admitting: *Deleted

## 2015-02-18 DIAGNOSIS — M1711 Unilateral primary osteoarthritis, right knee: Secondary | ICD-10-CM | POA: Diagnosis not present

## 2015-03-25 DIAGNOSIS — Z23 Encounter for immunization: Secondary | ICD-10-CM | POA: Diagnosis not present

## 2015-03-27 DIAGNOSIS — H401224 Low-tension glaucoma, left eye, indeterminate stage: Secondary | ICD-10-CM | POA: Diagnosis not present

## 2015-04-01 DIAGNOSIS — M1711 Unilateral primary osteoarthritis, right knee: Secondary | ICD-10-CM | POA: Diagnosis not present

## 2015-04-23 ENCOUNTER — Ambulatory Visit (INDEPENDENT_AMBULATORY_CARE_PROVIDER_SITE_OTHER): Payer: Medicare Other | Admitting: Gynecology

## 2015-04-23 ENCOUNTER — Encounter: Payer: Self-pay | Admitting: Gynecology

## 2015-04-23 VITALS — BP 132/78 | Ht 66.0 in | Wt 156.0 lb

## 2015-04-23 DIAGNOSIS — N952 Postmenopausal atrophic vaginitis: Secondary | ICD-10-CM | POA: Diagnosis not present

## 2015-04-23 DIAGNOSIS — M8580 Other specified disorders of bone density and structure, unspecified site: Secondary | ICD-10-CM

## 2015-04-23 DIAGNOSIS — Z01419 Encounter for gynecological examination (general) (routine) without abnormal findings: Secondary | ICD-10-CM

## 2015-04-23 NOTE — Patient Instructions (Signed)

## 2015-04-23 NOTE — Progress Notes (Signed)
Laura Lee 06-28-1931 [id number]        81 y.o.  [id number]  For breast and pelvic exam.  Past medical history,surgical history, problem list, medications, allergies, family history and social history were all reviewed and documented as reviewed in the EPIC chart.  ROS:  Performed with pertinent positives and negatives included in the history, assessment and plan.   Additional significant findings :  none   Exam: Laura Lee Vitals:   04/23/15 1410  BP: 132/78  Height: 5\' 6"  (1.676 m)  Weight: 156 lb (70.761 kg)   General appearance:  Normal affect, orientation and appearance. Skin: Grossly normal HEENT: Without gross lesions.  No cervical or supraclavicular adenopathy. Thyroid normal.  Lungs:  Clear without wheezing, rales or rhonchi Cardiac: RR, without RMG Abdominal:  Soft, nontender, without masses, guarding, rebound, organomegaly or hernia Breasts:  Examined lying and sitting without masses, retractions, discharge or axillary adenopathy. Pelvic:  Ext/BUS/vagina with atrophic changes. No evidence of vaginal cyst as before  Cervix with atrophic changes  Uterus anteverted, normal size, shape and contour, midline and mobile nontender   Adnexa  Without masses or tenderness    Anus and perineum  Normal   Rectovaginal  Normal sphincter tone without palpated masses or tenderness.    Assessment/Plan:  81 y.o. [id number] female for breast and pelvic exam  1. Postmenopausal. Doing well without significant hot flushes, night sweats, vaginal dryness or any vaginal bleeding. Continue to monitor report any issues or vaginal bleeding. 2. Osteopenia. DEXA 2015 T score -1.9 FRAX 15%/4%. I discussed treatment options given increased FRAX and patient declined treatment. Plan repeat DEXA next year a two-year interval. 3. Pap smear 2013. No Pap smear done today. No history of abnormal Pap smears.  We both agreed to stop screening per current screening guidelines based on  age. 4. Colonoscopy 2012. Repeat at their recommended interval. 5. Mammography 2016. Repeat with annual mammography when due. SBE monthly reviewed. 6. Health maintenance. No routine lab work done as patient reports is done at her primary physician's office. Follow up in one year, sooner as needed.   Laura Auerbach MD, 2:38 PM 04/23/2015

## 2015-07-23 DIAGNOSIS — I10 Essential (primary) hypertension: Secondary | ICD-10-CM | POA: Diagnosis not present

## 2015-07-23 DIAGNOSIS — L821 Other seborrheic keratosis: Secondary | ICD-10-CM | POA: Diagnosis not present

## 2015-07-23 DIAGNOSIS — M85861 Other specified disorders of bone density and structure, right lower leg: Secondary | ICD-10-CM | POA: Diagnosis not present

## 2015-07-23 DIAGNOSIS — Z79899 Other long term (current) drug therapy: Secondary | ICD-10-CM | POA: Diagnosis not present

## 2015-08-15 DIAGNOSIS — M859 Disorder of bone density and structure, unspecified: Secondary | ICD-10-CM | POA: Diagnosis not present

## 2015-08-15 DIAGNOSIS — M8589 Other specified disorders of bone density and structure, multiple sites: Secondary | ICD-10-CM | POA: Diagnosis not present

## 2015-09-17 ENCOUNTER — Other Ambulatory Visit: Payer: Self-pay

## 2015-09-17 DIAGNOSIS — Z1231 Encounter for screening mammogram for malignant neoplasm of breast: Secondary | ICD-10-CM

## 2015-10-29 ENCOUNTER — Ambulatory Visit
Admission: RE | Admit: 2015-10-29 | Discharge: 2015-10-29 | Disposition: A | Discharge status: Home / Self Care | Payer: Medicare Other | Source: Ambulatory Visit

## 2015-10-29 DIAGNOSIS — Z1231 Encounter for screening mammogram for malignant neoplasm of breast: Secondary | ICD-10-CM | POA: Diagnosis not present

## 2015-11-01 DIAGNOSIS — H401211 Low-tension glaucoma, right eye, mild stage: Secondary | ICD-10-CM | POA: Diagnosis not present

## 2015-11-01 DIAGNOSIS — H25813 Combined forms of age-related cataract, bilateral: Secondary | ICD-10-CM | POA: Diagnosis not present

## 2015-11-01 DIAGNOSIS — H401223 Low-tension glaucoma, left eye, severe stage: Secondary | ICD-10-CM | POA: Diagnosis not present

## 2015-12-06 DIAGNOSIS — I499 Cardiac arrhythmia, unspecified: Secondary | ICD-10-CM | POA: Diagnosis not present

## 2015-12-25 DIAGNOSIS — H903 Sensorineural hearing loss, bilateral: Secondary | ICD-10-CM | POA: Diagnosis not present

## 2016-01-21 ENCOUNTER — Other Ambulatory Visit: Payer: Self-pay | Admitting: Geriatric Medicine

## 2016-01-21 DIAGNOSIS — I498 Other specified cardiac arrhythmias: Secondary | ICD-10-CM | POA: Diagnosis not present

## 2016-01-21 DIAGNOSIS — I480 Paroxysmal atrial fibrillation: Secondary | ICD-10-CM

## 2016-01-21 DIAGNOSIS — E039 Hypothyroidism, unspecified: Secondary | ICD-10-CM | POA: Diagnosis not present

## 2016-01-21 DIAGNOSIS — Z7901 Long term (current) use of anticoagulants: Secondary | ICD-10-CM | POA: Diagnosis not present

## 2016-01-21 DIAGNOSIS — Z0000 Encounter for general adult medical examination without abnormal findings: Secondary | ICD-10-CM | POA: Diagnosis not present

## 2016-01-21 DIAGNOSIS — Z1389 Encounter for screening for other disorder: Secondary | ICD-10-CM | POA: Diagnosis not present

## 2016-01-21 DIAGNOSIS — I10 Essential (primary) hypertension: Secondary | ICD-10-CM | POA: Diagnosis not present

## 2016-01-21 DIAGNOSIS — Z79899 Other long term (current) drug therapy: Secondary | ICD-10-CM | POA: Diagnosis not present

## 2016-01-28 DIAGNOSIS — I480 Paroxysmal atrial fibrillation: Secondary | ICD-10-CM | POA: Diagnosis not present

## 2016-01-28 DIAGNOSIS — F5101 Primary insomnia: Secondary | ICD-10-CM | POA: Diagnosis not present

## 2016-01-28 DIAGNOSIS — I10 Essential (primary) hypertension: Secondary | ICD-10-CM | POA: Diagnosis not present

## 2016-01-30 ENCOUNTER — Encounter: Payer: Self-pay | Admitting: Cardiology

## 2016-02-03 ENCOUNTER — Ambulatory Visit (HOSPITAL_COMMUNITY): Payer: Medicare Other | Attending: Internal Medicine

## 2016-02-03 DIAGNOSIS — I340 Nonrheumatic mitral (valve) insufficiency: Secondary | ICD-10-CM | POA: Insufficient documentation

## 2016-02-03 DIAGNOSIS — I480 Paroxysmal atrial fibrillation: Secondary | ICD-10-CM | POA: Diagnosis not present

## 2016-02-03 DIAGNOSIS — I4891 Unspecified atrial fibrillation: Secondary | ICD-10-CM | POA: Diagnosis present

## 2016-02-03 DIAGNOSIS — R600 Localized edema: Secondary | ICD-10-CM | POA: Insufficient documentation

## 2016-02-03 DIAGNOSIS — R0789 Other chest pain: Secondary | ICD-10-CM | POA: Diagnosis not present

## 2016-02-04 ENCOUNTER — Encounter: Payer: Self-pay | Admitting: Cardiology

## 2016-02-04 ENCOUNTER — Encounter (INDEPENDENT_AMBULATORY_CARE_PROVIDER_SITE_OTHER): Payer: Self-pay

## 2016-02-04 ENCOUNTER — Ambulatory Visit (INDEPENDENT_AMBULATORY_CARE_PROVIDER_SITE_OTHER): Payer: Medicare Other | Admitting: Cardiology

## 2016-02-04 VITALS — BP 126/84 | HR 114 | Ht 65.5 in | Wt 150.4 lb

## 2016-02-04 DIAGNOSIS — I5020 Unspecified systolic (congestive) heart failure: Secondary | ICD-10-CM

## 2016-02-04 DIAGNOSIS — I509 Heart failure, unspecified: Secondary | ICD-10-CM | POA: Insufficient documentation

## 2016-02-04 DIAGNOSIS — I481 Persistent atrial fibrillation: Secondary | ICD-10-CM | POA: Diagnosis not present

## 2016-02-04 DIAGNOSIS — I4819 Other persistent atrial fibrillation: Secondary | ICD-10-CM

## 2016-02-04 DIAGNOSIS — Z01812 Encounter for preprocedural laboratory examination: Secondary | ICD-10-CM

## 2016-02-04 DIAGNOSIS — I480 Paroxysmal atrial fibrillation: Secondary | ICD-10-CM | POA: Diagnosis not present

## 2016-02-04 MED ORDER — LISINOPRIL 20 MG PO TABS: 20.0000 mg | ORAL_TABLET | Freq: Every day | ORAL | 3 refills | Status: DC

## 2016-02-04 MED ORDER — AMIODARONE HCL 200 MG PO TABS: 200.0000 mg | ORAL_TABLET | Freq: Every day | ORAL | 3 refills | Status: DC

## 2016-02-04 MED ORDER — AMIODARONE HCL 200 MG PO TABS: 400.0000 mg | ORAL_TABLET | Freq: Two times a day (BID) | ORAL | 0 refills | Status: DC

## 2016-02-04 MED ORDER — METOPROLOL SUCCINATE ER 50 MG PO TB24: 50.0000 mg | ORAL_TABLET | Freq: Every day | ORAL | 3 refills | Status: DC

## 2016-02-04 NOTE — Patient Instructions (Addendum)
Medication Instructions:   Your physician has recommended you make the following change in your medication:   1) STOP Dilitazem  2) START Toprol 50 mg daily  3) STOP Maxzide  4) START Lisinopril 20 mg daily  5) START Amiodarone   - take 400 mg twice daily for 7 days, then take 200 mg once daily  --- If you need a refill on your cardiac medications before your next appointment, please call your pharmacy. ---  Labwork:  None ordered  Testing/Procedures: Your physician has recommended that you have a Cardioversion (DCCV). Electrical Cardioversion uses a jolt of electricity to your heart either through paddles or wired patches attached to your chest. This is a controlled, usually prescheduled, procedure. Defibrillation is done under light anesthesia in the hospital, and you usually go home the day of the procedure. This is done to get your heart back into a normal rhythm. You are not awake for the procedure.   Trinidad Curet, RN will call you to arrange this procedure for next week  Follow-Up:  Your physician recommends that you schedule a follow-up appointment in: 3 months with Dr. Curt Bears.  Thank you for choosing [facility]!!   Trinidad Curet, RN [phone]   Any Other Special Instructions Will Be Listed Below (If Applicable).  Metoprolol extended-release tablets What is this medicine? METOPROLOL (me TOE proe lole) is a beta-blocker. Beta-blockers reduce the workload on the heart and help it to beat more regularly. This medicine is used to treat high blood pressure and to prevent chest pain. It is also used to after a heart attack and to prevent an additional heart attack from occurring. This medicine may be used for other purposes; ask your health care provider or pharmacist if you have questions. What should I tell my health care provider before I take this medicine? They need to know if you have any of these conditions: -diabetes -heart or vessel disease like slow heart  rate, worsening heart failure, heart block, sick sinus syndrome or Raynaud's disease -kidney disease -liver disease -lung or breathing disease, like asthma or emphysema -pheochromocytoma -thyroid disease -an unusual or allergic reaction to metoprolol, other beta-blockers, medicines, foods, dyes, or preservatives -pregnant or trying to get pregnant -breast-feeding How should I use this medicine? Take this medicine by mouth with a glass of water. Follow the directions on the prescription label. Do not crush or chew. Take this medicine with or immediately after meals. Take your doses at regular intervals. Do not take more medicine than directed. Do not stop taking this medicine suddenly. This could lead to serious heart-related effects. Talk to your pediatrician regarding the use of this medicine in children. While this drug may be prescribed for children as young as 6 years for selected conditions, precautions do apply. Overdosage: If you think you have taken too much of this medicine contact a poison control center or emergency room at once. NOTE: This medicine is only for you. Do not share this medicine with others. What if I miss a dose? If you miss a dose, take it as soon as you can. If it is almost time for your next dose, take only that dose. Do not take double or extra doses. What may interact with this medicine? This medicine may interact with the following medications: -certain medicines for blood pressure, heart disease, irregular heart beat -certain medicines for depression, like monoamine oxidase (MAO) inhibitors, fluoxetine, or paroxetine -clonidine -dobutamine -epinephrine -isoproterenol -reserpine This list may not describe all possible interactions.  Give your health care provider a list of all the medicines, herbs, non-prescription drugs, or dietary supplements you use. Also tell them if you smoke, drink alcohol, or use illegal drugs. Some items may interact with your  medicine. What should I watch for while using this medicine? Visit your doctor or health care professional for regular check ups. Contact your doctor right away if your symptoms worsen. Check your blood pressure and pulse rate regularly. Ask your health care professional what your blood pressure and pulse rate should be, and when you should contact them. You may get drowsy or dizzy. Do not drive, use machinery, or do anything that needs mental alertness until you know how this medicine affects you. Do not sit or stand up quickly, especially if you are an older patient. This reduces the risk of dizzy or fainting spells. Contact your doctor if these symptoms continue. Alcohol may interfere with the effect of this medicine. Avoid alcoholic drinks. What side effects may I notice from receiving this medicine? Side effects that you should report to your doctor or health care professional as soon as possible: -allergic reactions like skin rash, itching or hives -cold or numb hands or feet -depression -difficulty breathing -faint -fever with sore throat -irregular heartbeat, chest pain -rapid weight gain -swollen legs or ankles Side effects that usually do not require medical attention (report to your doctor or health care professional if they continue or are bothersome): -anxiety or nervousness -change in sex drive or performance -dry skin -headache -nightmares or trouble sleeping -short term memory loss -stomach upset or diarrhea -unusually tired This list may not describe all possible side effects. Call your doctor for medical advice about side effects. You may report side effects to FDA at 1-800-FDA-1088. Where should I keep my medicine? Keep out of the reach of children. Store at room temperature between 15 and 30 degrees C (59 and 86 degrees F). Throw away any unused medicine after the expiration date. NOTE: This sheet is a summary. It may not cover all possible information. If you have  questions about this medicine, talk to your doctor, pharmacist, or health care provider.    2016, Elsevier/Gold Standard. (2013-01-13 14:41:37)   Lisinopril tablets What is this medicine? LISINOPRIL (lyse IN oh pril) is an ACE inhibitor. This medicine is used to treat high blood pressure and heart failure. It is also used to protect the heart immediately after a heart attack. This medicine may be used for other purposes; ask your health care provider or pharmacist if you have questions. What should I tell my health care provider before I take this medicine? They need to know if you have any of these conditions: -diabetes -heart or blood vessel disease -kidney disease -low blood pressure -previous swelling of the tongue, face, or lips with difficulty breathing, difficulty swallowing, hoarseness, or tightening of the throat -an unusual or allergic reaction to lisinopril, other ACE inhibitors, insect venom, foods, dyes, or preservatives -pregnant or trying to get pregnant -breast-feeding How should I use this medicine? Take this medicine by mouth with a glass of water. Follow the directions on your prescription label. You may take this medicine with or without food. If it upsets your stomach, take it with food. Take your medicine at regular intervals. Do not take it more often than directed. Do not stop taking except on your doctor's advice. Talk to your pediatrician regarding the use of this medicine in children. Special care may be needed. While this drug may be prescribed  for children as young as 33 years of age for selected conditions, precautions do apply. Overdosage: If you think you have taken too much of this medicine contact a poison control center or emergency room at once. NOTE: This medicine is only for you. Do not share this medicine with others. What if I miss a dose? If you miss a dose, take it as soon as you can. If it is almost time for your next dose, take only that dose. Do  not take double or extra doses. What may interact with this medicine? Do not take this medicine with any of the following medications: -hymenoptera venomThis medicines may also interact with the following medications: -aliskiren -angiotensin receptor blockers, like losartan or valsartan -certain medicines for diabetes -diuretics -everolimus -gold compounds -lithium -NSAIDs, medicines for pain and inflammation, like ibuprofen or naproxen -potassium salts or supplements -salt substitutes -sirolimus -temsirolimus This list may not describe all possible interactions. Give your health care provider a list of all the medicines, herbs, non-prescription drugs, or dietary supplements you use. Also tell them if you smoke, drink alcohol, or use illegal drugs. Some items may interact with your medicine. What should I watch for while using this medicine? Visit your doctor or health care professional for regular check ups. Check your blood pressure as directed. Ask your doctor what your blood pressure should be, and when you should contact him or her. Do not treat yourself for coughs, colds, or pain while you are using this medicine without asking your doctor or health care professional for advice. Some ingredients may increase your blood pressure. Women should inform their doctor if they wish to become pregnant or think they might be pregnant. There is a potential for serious side effects to an unborn child. Talk to your health care professional or pharmacist for more information. Check with your doctor or health care professional if you get an attack of severe diarrhea, nausea and vomiting, or if you sweat a lot. The loss of too much body fluid can make it dangerous for you to take this medicine. You may get drowsy or dizzy. Do not drive, use machinery, or do anything that needs mental alertness until you know how this drug affects you. Do not stand or sit up quickly, especially if you are an older  patient. This reduces the risk of dizzy or fainting spells. Alcohol can make you more drowsy and dizzy. Avoid alcoholic drinks. Avoid salt substitutes unless you are told otherwise by your doctor or health care professional. What side effects may I notice from receiving this medicine? Side effects that you should report to your doctor or health care professional as soon as possible: -allergic reactions like skin rash, itching or hives, swelling of the hands, feet, face, lips, throat, or tongue -breathing problems -signs and symptoms of kidney injury like trouble passing urine or change in the amount of urine -signs and symptoms of increased potassium like muscle weakness; chest pain; or fast, irregular heartbeat -signs and symptoms of liver injury like dark yellow or brown urine; general ill feeling or flu-like symptoms; light-colored stools; loss of appetite; nausea; right upper belly pain; unusually weak or tired; yellowing of the eyes or skin -signs and symptoms of low blood pressure like dizziness; feeling faint or lightheaded, falls; unusually weak or tired -stomach pain with or without nausea and vomiting Side effects that usually do not require medical attention (report to your doctor or health care professional if they continue or are bothersome): -changes in taste -  cough -dizziness -fever -headache -sensitivity to light This list may not describe all possible side effects. Call your doctor for medical advice about side effects. You may report side effects to FDA at 1-800-FDA-1088. Where should I keep my medicine? Keep out of the reach of children. Store at room temperature between 15 and 30 degrees C (59 and 86 degrees F). Protect from moisture. Keep container tightly closed. Throw away any unused medicine after the expiration date. NOTE: This sheet is a summary. It may not cover all possible information. If you have questions about this medicine, talk to your doctor, pharmacist, or  health care provider.    2016, Elsevier/Gold Standard. (2015-01-03 20:38:20)   Amiodarone tablets What is this medicine? AMIODARONE (a MEE oh da rone) is an antiarrhythmic drug. It helps make your heart beat regularly. Because of the side effects caused by this medicine, it is only used when other medicines have not worked. It is usually used for heartbeat problems that may be life threatening. This medicine may be used for other purposes; ask your health care provider or pharmacist if you have questions. What should I tell my health care provider before I take this medicine? They need to know if you have any of these conditions: -liver disease -lung disease -other heart problems -thyroid disease -an unusual or allergic reaction to amiodarone, iodine, other medicines, foods, dyes, or preservatives -pregnant or trying to get pregnant -breast-feeding How should I use this medicine? Take this medicine by mouth with a glass of water. Follow the directions on the prescription label. You can take this medicine with or without food. However, you should always take it the same way each time. Take your doses at regular intervals. Do not take your medicine more often than directed. Do not stop taking except on the advice of your doctor or health care professional. A special MedGuide will be given to you by the pharmacist with each prescription and refill. Be sure to read this information carefully each time. Talk to your pediatrician regarding the use of this medicine in children. Special care may be needed. Overdosage: If you think you have taken too much of this medicine contact a poison control center or emergency room at once. NOTE: This medicine is only for you. Do not share this medicine with others. What if I miss a dose? If you miss a dose, take it as soon as you can. If it is almost time for your next dose, take only that dose. Do not take double or extra doses. What may interact with this  medicine? Do not take this medicine with any of the following medications: -abarelix -apomorphine -arsenic trioxide -certain antibiotics like erythromycin, gemifloxacin, levofloxacin, pentamidine -certain medicines for depression like amoxapine, tricyclic antidepressants -certain medicines for fungal infections like fluconazole, itraconazole, ketoconazole, posaconazole, voriconazole -certain medicines for irregular heart beat like disopyramide, dofetilide, dronedarone, ibutilide, propafenone, sotalol -certain medicines for malaria like chloroquine, halofantrine -cisapride -droperidol -haloperidol -hawthorn -maprotiline -methadone -phenothiazines like chlorpromazine, mesoridazine, thioridazine -pimozide -ranolazine -red yeast rice -vardenafil -ziprasidone This medicine may also interact with the following medications: -antiviral medicines for HIV or AIDS -certain medicines for blood pressure, heart disease, irregular heart beat -certain medicines for cholesterol like atorvastatin, cerivastatin, lovastatin, simvastatin -certain medicines for hepatitis C like sofosbuvir and ledipasvir; sofosbuvir -certain medicines for seizures like phenytoin -certain medicines for thyroid problems -certain medicines that treat or prevent blood clots like warfarin -cholestyramine -cimetidine -clopidogrel -cyclosporine -dextromethorphan -diuretics -fentanyl -general anesthetics -grapefruit juice -lidocaine -loratadine -methotrexate -other  medicines that prolong the QT interval (cause an abnormal heart rhythm) -procainamide -quinidine -rifabutin, rifampin, or rifapentine -St. John's Wort -trazodone This list may not describe all possible interactions. Give your health care provider a list of all the medicines, herbs, non-prescription drugs, or dietary supplements you use. Also tell them if you smoke, drink alcohol, or use illegal drugs. Some items may interact with your medicine. What  should I watch for while using this medicine? Your condition will be monitored closely when you first begin therapy. Often, this drug is first started in a hospital or other monitored health care setting. Once you are on maintenance therapy, visit your doctor or health care professional for regular checks on your progress. Because your condition and use of this medicine carry some risk, it is a good idea to carry an identification card, necklace or bracelet with details of your condition, medications, and doctor or health care professional. Dennis Bast may get drowsy or dizzy. Do not drive, use machinery, or do anything that needs mental alertness until you know how this medicine affects you. Do not stand or sit up quickly, especially if you are an older patient. This reduces the risk of dizzy or fainting spells. This medicine can make you more sensitive to the sun. Keep out of the sun. If you cannot avoid being in the sun, wear protective clothing and use sunscreen. Do not use sun lamps or tanning beds/booths. You should have regular eye exams before and during treatment. Call your doctor if you have blurred vision, see halos, or your eyes become sensitive to light. Your eyes may get dry. It may be helpful to use a lubricating eye solution or artificial tears solution. If you are going to have surgery or a procedure that requires contrast dyes, tell your doctor or health care professional that you are taking this medicine. What side effects may I notice from receiving this medicine? Side effects that you should report to your doctor or health care professional as soon as possible: -allergic reactions like skin rash, itching or hives, swelling of the face, lips, or tongue -blue-gray coloring of the skin -blurred vision, seeing blue green halos, increased sensitivity of the eyes to light -breathing problems -chest pain -dark urine -fast, irregular heartbeat -feeling faint or light-headed -intolerance to heat  or cold -nausea or vomiting -pain and swelling of the scrotum -pain, tingling, numbness in feet, hands -redness, blistering, peeling or loosening of the skin, including inside the mouth -spitting up blood -stomach pain -sweating -unusual or uncontrolled movements of body -unusually weak or tired -weight gain or loss -yellowing of the eyes or skin Side effects that usually do not require medical attention (report to your doctor or health care professional if they continue or are bothersome): -change in sex drive or performance -constipation -dizziness -headache -loss of appetite -trouble sleeping This list may not describe all possible side effects. Call your doctor for medical advice about side effects. You may report side effects to FDA at 1-800-FDA-1088. Where should I keep my medicine? Keep out of the reach of children. Store at room temperature between 20 and 25 degrees C (68 and 77 degrees F). Protect from light. Keep container tightly closed. Throw away any unused medicine after the expiration date. NOTE: This sheet is a summary. It may not cover all possible information. If you have questions about this medicine, talk to your doctor, pharmacist, or health care provider.    2016, Elsevier/Gold Standard. (2013-08-14 19:48:11)    Electrical Cardioversion Electrical  cardioversion is the delivery of a jolt of electricity to change the rhythm of the heart. Sticky patches or metal paddles are placed on the chest to deliver the electricity from a device. This is done to restore a normal rhythm. A rhythm that is too fast or not regular keeps the heart from pumping well. Electrical cardioversion is done in an emergency if:   There is low or no blood pressure as a result of the heart rhythm.   Normal rhythm must be restored as fast as possible to protect the brain and heart from further damage.   It may save a life. Cardioversion may be done for heart rhythms that are not  immediately life threatening, such as atrial fibrillation or flutter, in which:   The heart is beating too fast or is not regular.   Medicine to change the rhythm has not worked.   It is safe to wait in order to allow time for preparation.  Symptoms of the abnormal rhythm are bothersome.  The risk of stroke and other serious problems can be reduced. LET [facility] CARE PROVIDER KNOW ABOUT:   Any allergies you have.  All medicines you are taking, including vitamins, herbs, eye drops, creams, and over-the-counter medicines.  Previous problems you or members of your family have had with the use of anesthetics.   Any blood disorders you have.   Previous surgeries you have had.   Medical conditions you have. RISKS AND COMPLICATIONS  Generally, this is a safe procedure. However, problems can occur and include:   Breathing problems related to the anesthetic used.  A blood clot that breaks free and travels to other parts of your body. This could cause a stroke or other problems. The risk of this is lowered by use of blood-thinning medicine (anticoagulant) prior to the procedure.  Cardiac arrest (rare). BEFORE THE PROCEDURE   You may have tests to detect blood clots in your heart and to evaluate heart function.  You may start taking anticoagulants so your blood does not clot as easily.   Medicines may be given to help stabilize your heart rate and rhythm. PROCEDURE  You will be given medicine through an IV tube to reduce discomfort and make you sleepy (sedative).   An electrical shock will be delivered. AFTER THE PROCEDURE Your heart rhythm will be watched to make sure it does not change.    This information is not intended to replace advice given to you by your health care provider. Make sure you discuss any questions you have with your health care provider.   Document Released: 05/01/2002 Document Revised: 06/01/2014 Document Reviewed: 11/23/2012 Elsevier  Interactive Patient Education [employer].

## 2016-02-04 NOTE — Progress Notes (Signed)
Electrophysiology Office Note   Date:  02/04/2016   ID:  Laura Lee, DOB 1931-11-26, MRN [id number]  PCP:  Mathews Argyle, MD  Primary Electrophysiologist:  Nhia Heaphy Meredith Leeds, MD    Chief Complaint  Patient presents with  . Advice Only    Afib     History of Present Illness: Laura Lee is a 82 y.o. female who presents today for electrophysiology evaluation.   She was found to be in new onset atrial fibrillation with rapid ventricular response. She takes Eliquis for anticoagulation. She has symptoms of palpitations due to her atrial fibrillation. She says she has been having palpitations the last few months. She has not noted issues of shortness of breath or very much fatigue.   Today, she denies symptoms of palpitations, chest pain, shortness of breath, orthopnea, PND, lower extremity edema, claudication, dizziness, presyncope, syncope, bleeding, or neurologic sequela. The patient is tolerating medications without difficulties and is otherwise without complaint today.    Past Medical History:  Diagnosis Date  . Cancer (HCC)    Breast and thyroid cancer  . Glaucoma   . Hearing impaired    Hearing aids  . Hypertension   . Osteopenia 05/2013   T score -1.9 FRAX 15%/4.3%  . Thyroid disease    Cancer   Past Surgical History:  Procedure Laterality Date  . APPENDECTOMY    . BREAST SURGERY  1989   Lumpectomy right breast  . Mole excised     Benign  . THYROID SURGERY     Removed 1 lobe  . TONSILLECTOMY       Current Outpatient Prescriptions  Medication Sig Dispense Refill  . calcium citrate-vitamin D (CITRACAL+D) 315-200 MG-UNIT per tablet Take 1 tablet by mouth 2 (two) times daily.    . Cholecalciferol (VITAMIN D PO) Take by mouth.    Arne Cleveland 5 MG TABS tablet Take 5 mg by mouth 2 (two) times daily.    Marland Kitchen levothyroxine (SYNTHROID, LEVOTHROID) 88 MCG tablet Take 88 mcg by mouth daily before breakfast.    . LUMIGAN 0.01 % SOLN as needed.    Marland Kitchen amiodarone  (PACERONE) 200 MG tablet Take 2 tablets (400 mg total) by mouth 2 (two) times daily. For seven days 28 tablet 0  . amiodarone (PACERONE) 200 MG tablet Take 1 tablet (200 mg total) by mouth daily. 30 tablet 3  . lisinopril (PRINIVIL,ZESTRIL) 20 MG tablet Take 1 tablet (20 mg total) by mouth daily. 30 tablet 3  . metoprolol succinate (TOPROL-XL) 50 MG 24 hr tablet Take 1 tablet (50 mg total) by mouth daily. Take with or immediately following a meal. 30 tablet 3   No current facility-administered medications for this visit.     Allergies:   Combigan [brimonidine tartrate-timolol] and Pneumococcal vaccines   Social History:  The patient  reports that she has never smoked. She has never used smokeless tobacco. She reports that she drinks alcohol. She reports that she does not use drugs.   Family History:  The patient's family history includes Breast cancer (age of onset: 3) in her daughter; Breast cancer (age of onset: 63) in her mother; Cancer in her sister; Heart attack in her brother.    ROS:  Please see the history of present illness.   Otherwise, review of systems is positive for fatigue, chest pressure, palpitations, joint swelling.   All other systems are reviewed and negative.    PHYSICAL EXAM: VS:  BP 126/84   Pulse (!) 114  Ht 5' 5.5" (1.664 m)   Wt 150 lb 6.4 oz (68.2 kg)   BMI 24.65 kg/m  , BMI Body mass index is 24.65 kg/m. GEN: Well nourished, well developed, in no acute distress  HEENT: normal  Neck: no JVD, carotid bruits, or masses Cardiac: tachycardic, irregular; no murmurs, rubs, or gallops,no edema  Respiratory:  clear to auscultation bilaterally, normal work of breathing GI: soft, nontender, nondistended, + BS MS: no deformity or atrophy  Skin: warm and dry Neuro:  Strength and sensation are intact Psych: euthymic mood, full affect  EKG:  EKG is ordered today. Personal review of the ekg ordered shows atrial fibrillation, rate 114, LAFB  Recent Labs: No  results found for requested labs within last 8760 hours.    Lipid Panel  No results found for: CHOL, TRIG, HDL, CHOLHDL, VLDL, LDLCALC, LDLDIRECT   Wt Readings from Last 3 Encounters:  02/04/16 150 lb 6.4 oz (68.2 kg)  04/23/15 156 lb (70.8 kg)  01/31/15 158 lb (71.7 kg)      Other studies Reviewed: Additional studies/ records that were reviewed today include: TTE 02/03/16  Review of the above records today demonstrates:  - Left ventricle: LVEF is approximately 15 to 20% with diffuse   hypokinesis. The cavity size was normal. Wall thickness was   normal. - Aortic valve: AV appears to be bicuspid. - Mitral valve: There was mild regurgitation. - Left atrium: The atrium was mildly to moderately dilated. - Right ventricle: Systolic function was mildly to moderately   reduced.   ASSESSMENT AND PLAN:  1.  Persistent atrial fibrillation: currently on Eliquis. Her rate is not well controlled today, and with her cardiomyopathy, which could be due to uncontrolled rates in atrial fibrillation, she would benefit from a rhythm control strategy. Laura Lee therefore start her on amiodarone today with a plan for cardioversion in 1 week.hopefully this Laura Lee help to improve her ejection fraction.  This patients CHA2DS2-VASc Score and unadjusted Ischemic Stroke Rate (% per year) is equal to 7.2 % stroke rate/year from a score of 5  Above score calculated as 1 point each if present [CHF, HTN, DM, Vascular=MI/PAD/Aortic Plaque, Age if 65-74, or Female] Above score calculated as 2 points each if present [Age > 75, or Stroke/TIA/TE]   2. Likely nonischemic cardiomyopathy: possibly due to her atrial fibrillation with rapid rates, which have potentially been going on for the last few months.her left atrium is mild to moderately dilated, is a good sign for getting her back into normal rhythm. Due to her cardiomyopathy, Laura Lee plan to start her on lisinopril and Toprol-XL. We'll stop her diltiazem and  HCTZ.    Current medicines are reviewed at length with the patient today.   The patient does not have concerns regarding her medicines.  The following changes were made today:  Start amiodarone, toprol XL, lisinopril, stop diltiazem, HCTZ  Labs/ tests ordered today include:  Orders Placed This Encounter  Procedures  . Basic metabolic panel  . CBC w/Diff  . EKG 12-Lead     Disposition:   FU with Laura Lee 3 months  Signed, Laura Damron Meredith Leeds, MD  02/04/2016 4:47 PM     [place] [address] [phone] (office) [phone] (fax)

## 2016-02-07 ENCOUNTER — Encounter: Payer: Self-pay | Admitting: *Deleted

## 2016-02-07 ENCOUNTER — Telehealth: Payer: Self-pay | Admitting: *Deleted

## 2016-02-07 NOTE — Telephone Encounter (Signed)
DCCV instructions reviewed with the patient. Pre procedure labs on 9/19. Letter of instructions left at front desk for her to pick up. Patient verbalized understanding and agreeable to plan.

## 2016-02-08 ENCOUNTER — Other Ambulatory Visit: Payer: Self-pay | Admitting: Cardiology

## 2016-02-11 ENCOUNTER — Other Ambulatory Visit: Payer: Medicare Other | Admitting: *Deleted

## 2016-02-11 DIAGNOSIS — I481 Persistent atrial fibrillation: Secondary | ICD-10-CM | POA: Diagnosis not present

## 2016-02-11 DIAGNOSIS — I5020 Unspecified systolic (congestive) heart failure: Secondary | ICD-10-CM | POA: Diagnosis not present

## 2016-02-11 DIAGNOSIS — I4819 Other persistent atrial fibrillation: Secondary | ICD-10-CM

## 2016-02-11 LAB — CBC WITH DIFFERENTIAL/PLATELET
Basophils Absolute: 99 cells/uL (ref 0–200)
Basophils Relative: 1 %
Eosinophils Absolute: 99 cells/uL (ref 15–500)
Eosinophils Relative: 1 %
HCT: 42.3 % (ref 35.0–45.0)
Hemoglobin: 14.2 g/dL (ref 11.7–15.5)
Lymphocytes Relative: 18 %
Lymphs Abs: 1782 cells/uL (ref 850–3900)
MCH: 28.7 pg (ref 27.0–33.0)
MCHC: 33.6 g/dL (ref 32.0–36.0)
MCV: 85.6 fL (ref 80.0–100.0)
MPV: 10.3 fL (ref 7.5–12.5)
Monocytes Absolute: 1089 cells/uL — ABNORMAL HIGH (ref 200–950)
Monocytes Relative: 11 %
Neutro Abs: 6831 cells/uL (ref 1500–7800)
Neutrophils Relative %: 69 %
RBC: 4.94 MIL/uL (ref 3.80–5.10)
RDW: 14.2 % (ref 11.0–15.0)
WBC: 9.9 10*3/uL (ref 3.8–10.8)

## 2016-02-11 LAB — BASIC METABOLIC PANEL
BUN: 16 mg/dL (ref 7–25)
CO2: 21 mmol/L (ref 20–31)
Calcium: 9.2 mg/dL (ref 8.6–10.4)
Chloride: 102 mmol/L (ref 98–110)
Creat: 0.99 mg/dL — ABNORMAL HIGH (ref 0.60–0.88)
Glucose, Bld: 99 mg/dL (ref 65–99)
Potassium: 4.2 mmol/L (ref 3.5–5.3)
Sodium: 138 mmol/L (ref 135–146)

## 2016-02-11 NOTE — Addendum Note (Signed)
Addended by: Eulis Foster on: 02/11/2016 10:22 AM   Modules accepted: Orders

## 2016-02-11 NOTE — Progress Notes (Signed)
Bmp

## 2016-02-13 DIAGNOSIS — H35359 Cystoid macular degeneration, unspecified eye: Secondary | ICD-10-CM | POA: Diagnosis not present

## 2016-02-14 ENCOUNTER — Encounter (HOSPITAL_COMMUNITY)
Admission: RE | Disposition: A | Discharge status: Home / Self Care | Payer: Self-pay | Source: Ambulatory Visit | Attending: Cardiology

## 2016-02-14 ENCOUNTER — Encounter (HOSPITAL_COMMUNITY): Payer: Self-pay | Admitting: *Deleted

## 2016-02-14 ENCOUNTER — Ambulatory Visit (HOSPITAL_COMMUNITY)
Admission: RE | Admit: 2016-02-14 | Discharge: 2016-02-14 | Disposition: A | Discharge status: Home / Self Care | Payer: Medicare Other | Source: Ambulatory Visit | Attending: Cardiology | Admitting: Cardiology

## 2016-02-14 ENCOUNTER — Ambulatory Visit (HOSPITAL_COMMUNITY): Payer: Medicare Other | Admitting: Certified Registered Nurse Anesthetist

## 2016-02-14 DIAGNOSIS — I4819 Other persistent atrial fibrillation: Secondary | ICD-10-CM | POA: Diagnosis present

## 2016-02-14 DIAGNOSIS — I481 Persistent atrial fibrillation: Secondary | ICD-10-CM | POA: Insufficient documentation

## 2016-02-14 DIAGNOSIS — Z79899 Other long term (current) drug therapy: Secondary | ICD-10-CM | POA: Diagnosis not present

## 2016-02-14 DIAGNOSIS — Z85850 Personal history of malignant neoplasm of thyroid: Secondary | ICD-10-CM | POA: Insufficient documentation

## 2016-02-14 DIAGNOSIS — M8580 Other specified disorders of bone density and structure, unspecified site: Secondary | ICD-10-CM | POA: Diagnosis not present

## 2016-02-14 DIAGNOSIS — Z7901 Long term (current) use of anticoagulants: Secondary | ICD-10-CM | POA: Insufficient documentation

## 2016-02-14 DIAGNOSIS — Z853 Personal history of malignant neoplasm of breast: Secondary | ICD-10-CM | POA: Insufficient documentation

## 2016-02-14 DIAGNOSIS — I4891 Unspecified atrial fibrillation: Secondary | ICD-10-CM | POA: Diagnosis not present

## 2016-02-14 DIAGNOSIS — I10 Essential (primary) hypertension: Secondary | ICD-10-CM | POA: Insufficient documentation

## 2016-02-14 HISTORY — PX: CARDIOVERSION: SHX1299

## 2016-02-14 SURGERY — CARDIOVERSION
Anesthesia: Monitor Anesthesia Care

## 2016-02-14 MED ORDER — FENTANYL CITRATE (PF) 100 MCG/2ML IJ SOLN: 25.0000 ug | INTRAMUSCULAR | Status: DC | PRN

## 2016-02-14 MED ORDER — ONDANSETRON HCL 4 MG/2ML IJ SOLN: 4.0000 mg | Freq: Once | INTRAMUSCULAR | Status: DC | PRN

## 2016-02-14 MED ORDER — ETOMIDATE 2 MG/ML IV SOLN
INTRAVENOUS | Status: DC | PRN
  Administered 2016-02-14: 6 mg via INTRAVENOUS

## 2016-02-14 MED ORDER — LIDOCAINE HCL (CARDIAC) 20 MG/ML IV SOLN
INTRAVENOUS | Status: DC | PRN
  Administered 2016-02-14: 40 mg via INTRATRACHEAL

## 2016-02-14 NOTE — Interval H&P Note (Signed)
History and Physical Interval Note:  02/14/2016 10:14 AM  Laura Lee  has presented today for surgery, with the diagnosis of AFIB  The various methods of treatment have been discussed with the patient and family. After consideration of risks, benefits and other options for treatment, the patient has consented to  Procedure(s): CARDIOVERSION (N/A) as a surgical intervention .  The patient's history has been reviewed, patient examined, no change in status, stable for surgery.  I have reviewed the patient's chart and labs.  Questions were answered to the patient's satisfaction.     Fransico Him

## 2016-02-14 NOTE — Discharge Instructions (Signed)
Electrical Cardioversion, Care After °Refer to this sheet in the next few weeks. These instructions provide you with information on caring for yourself after your procedure. Your health care provider may also give you more specific instructions. Your treatment has been planned according to current medical practices, but problems sometimes occur. Call your health care provider if you have any problems or questions after your procedure. °WHAT TO EXPECT AFTER THE PROCEDURE °After your procedure, it is typical to have the following sensations: °· Some redness on the skin where the shocks were delivered. If this is tender, a sunburn lotion or hydrocortisone cream may help. °· Possible return of an abnormal heart rhythm within hours or days after the procedure. °HOME CARE INSTRUCTIONS °· Take medicines only as directed by your health care provider. Be sure you understand how and when to take your medicine. °· Learn how to feel your pulse and check it often. °· Limit your activity for 48 hours after the procedure or as directed by your health care provider. °· Avoid or minimize caffeine and other stimulants as directed by your health care provider. °SEEK MEDICAL CARE IF: °· You feel like your heart is beating too fast or your pulse is not regular. °· You have any questions about your medicines. °· You have bleeding that will not stop. °SEEK IMMEDIATE MEDICAL CARE IF: °· You are dizzy or feel faint. °· It is hard to breathe or you feel short of breath. °· There is a change in discomfort in your chest. °· Your speech is slurred or you have trouble moving an arm or leg on one side of your body. °· You get a serious muscle cramp that does not go away. °· Your fingers or toes turn cold or blue. °  °This information is not intended to replace advice given to you by your health care provider. Make sure you discuss any questions you have with your health care provider. °  °Document Released: 03/01/2013 Document Revised: 06/01/2014  Document Reviewed: 03/01/2013 °Elsevier Interactive Patient Education ©2016 [employer] ° °

## 2016-02-14 NOTE — Transfer of Care (Signed)
Immediate Anesthesia Transfer of Care Note  Patient: Laura Lee  Procedure(s) Performed: Procedure(s): CARDIOVERSION (N/A)  Patient Location: Endoscopy Unit  Anesthesia Type:General  Level of Consciousness: awake, alert  and oriented  Airway & Oxygen Therapy: Patient Spontanous Breathing  Post-op Assessment: Report given to RN and Post -op Vital signs reviewed and stable  Post vital signs: Reviewed and stable  Last Vitals:  Vitals:   02/14/16 0917  BP: (!) 140/104  Pulse: (!) 114  Resp: 18  Temp: 36.6 C    Last Pain:  Vitals:   02/14/16 0917  TempSrc: Oral         Complications: No apparent anesthesia complications

## 2016-02-14 NOTE — Anesthesia Preprocedure Evaluation (Addendum)
Anesthesia Evaluation  Patient identified by MRN, date of birth, ID band Patient awake    Reviewed: Allergy & Precautions, NPO status , Patient's Chart, lab work & pertinent test results, reviewed documented beta blocker date and time   History of Anesthesia Complications Negative for: history of anesthetic complications  Airway Mallampati: III  TM Distance: >3 FB Neck ROM: Full    Dental  (+) Dental Advisory Given, Poor Dentition   Pulmonary neg pulmonary ROS,    Pulmonary exam normal breath sounds clear to auscultation       Cardiovascular hypertension, Pt. on medications and Pt. on home beta blockers (-) angina+CHF (EF 15-20%)  (-) Past MI and (-) Cardiac Stents + dysrhythmias Atrial Fibrillation + Valvular Problems/Murmurs (bicuspid AV) MR  Rhythm:Irregular Rate:Normal  TTE 02/03/2016: Study Conclusions  - Left ventricle: LVEF is approximately 15 to 20% with diffuse   hypokinesis. The cavity size was normal. Wall thickness was   normal. - Aortic valve: AV appears to be bicuspid. - Mitral valve: There was mild regurgitation. - Left atrium: The atrium was mildly to moderately dilated. - Right ventricle: Systolic function was mildly to moderately   reduced.   Neuro/Psych negative neurological ROS     GI/Hepatic negative GI ROS, Neg liver ROS, neg GERD  ,  Endo/Other  neg diabetesHypothyroidism H/o thyroid cancer  Renal/GU negative Renal ROS     Musculoskeletal osteopenia   Abdominal   Peds  Hematology negative hematology ROS (+)   Anesthesia Other Findings H/o breast cancer, glaucoma  Reproductive/Obstetrics                            Anesthesia Physical Anesthesia Plan  ASA: IV  Anesthesia Plan: MAC   Post-op Pain Management:    Induction: Intravenous  Airway Management Planned: Mask  Additional Equipment:   Intra-op Plan:   Post-operative Plan:   Informed Consent: I  have reviewed the patients History and Physical, chart, labs and discussed the procedure including the risks, benefits and alternatives for the proposed anesthesia with the patient or authorized representative who has indicated his/her understanding and acceptance.   Dental advisory given  Plan Discussed with: CRNA  Anesthesia Plan Comments:        Anesthesia Quick Evaluation

## 2016-02-14 NOTE — CV Procedure (Signed)
   Electrical Cardioversion Procedure Note Laura Lee [id number] 1931/11/30  Procedure: Electrical Cardioversion Indications:  Atrial Fibrillation  Time Out: Verified patient identification, verified procedure,medications/allergies/relevent history reviewed, required imaging and test results available.  Performed  Procedure Details  The patient was NPO after midnight. Anesthesia was administered at the beside  by Dr.Allen with 6mg  of Etomidate.  Cardioversion was done with synchronized biphasic defibrillation with AP pads with 150watts.  The patient converted to normal sinus rhythm. The patient tolerated the procedure well   IMPRESSION:  Successful cardioversion of atrial fibrillation    Tiran Sauseda 02/14/2016, 10:15 AM

## 2016-02-14 NOTE — Anesthesia Postprocedure Evaluation (Signed)
Anesthesia Post Note  Patient: Laura Lee  Procedure(s) Performed: Procedure(s) (LRB): CARDIOVERSION (N/A)  Patient location during evaluation: PACU Anesthesia Type: MAC Level of consciousness: awake and alert Pain management: pain level controlled Vital Signs Assessment: post-procedure vital signs reviewed and stable Respiratory status: spontaneous breathing, nonlabored ventilation and respiratory function stable Cardiovascular status: stable and blood pressure returned to baseline Anesthetic complications: no    Last Vitals:  Vitals:   02/14/16 1031 02/14/16 1040  BP: 112/71 114/86  Pulse: (!) 53   Resp: 10   Temp:      Last Pain:  Vitals:   02/14/16 0917  TempSrc: Oral                 Nilda Simmer

## 2016-02-14 NOTE — H&P (View-Only) (Signed)
Electrophysiology Office Note   Date:  02/04/2016   ID:  Laura Lee, DOB May 08, 1932, MRN [id number]  PCP:  Laura Argyle, MD  Primary Electrophysiologist:  Laura Schweers Meredith Leeds, MD    Chief Complaint  Patient presents with  . Advice Only    Afib     History of Present Illness: Laura Lee is a 82 y.o. female who presents today for electrophysiology evaluation.   She was found to be in new onset atrial fibrillation with rapid ventricular response. She takes Eliquis for anticoagulation. She has symptoms of palpitations due to her atrial fibrillation. She says she has been having palpitations the last few months. She has not noted issues of shortness of breath or very much fatigue.   Today, she denies symptoms of palpitations, chest pain, shortness of breath, orthopnea, PND, lower extremity edema, claudication, dizziness, presyncope, syncope, bleeding, or neurologic sequela. The patient is tolerating medications without difficulties and is otherwise without complaint today.    Past Medical History:  Diagnosis Date  . Cancer (HCC)    Breast and thyroid cancer  . Glaucoma   . Hearing impaired    Hearing aids  . Hypertension   . Osteopenia 05/2013   T score -1.9 FRAX 15%/4.3%  . Thyroid disease    Cancer   Past Surgical History:  Procedure Laterality Date  . APPENDECTOMY    . BREAST SURGERY  1989   Lumpectomy right breast  . Mole excised     Benign  . THYROID SURGERY     Removed 1 lobe  . TONSILLECTOMY       Current Outpatient Prescriptions  Medication Sig Dispense Refill  . calcium citrate-vitamin D (CITRACAL+D) 315-200 MG-UNIT per tablet Take 1 tablet by mouth 2 (two) times daily.    . Cholecalciferol (VITAMIN D PO) Take by mouth.    Laura Lee 5 MG TABS tablet Take 5 mg by mouth 2 (two) times daily.    Marland Kitchen levothyroxine (SYNTHROID, LEVOTHROID) 88 MCG tablet Take 88 mcg by mouth daily before breakfast.    . LUMIGAN 0.01 % SOLN as needed.    Marland Kitchen amiodarone  (PACERONE) 200 MG tablet Take 2 tablets (400 mg total) by mouth 2 (two) times daily. For seven days 28 tablet 0  . amiodarone (PACERONE) 200 MG tablet Take 1 tablet (200 mg total) by mouth daily. 30 tablet 3  . lisinopril (PRINIVIL,ZESTRIL) 20 MG tablet Take 1 tablet (20 mg total) by mouth daily. 30 tablet 3  . metoprolol succinate (TOPROL-XL) 50 MG 24 hr tablet Take 1 tablet (50 mg total) by mouth daily. Take with or immediately following a meal. 30 tablet 3   No current facility-administered medications for this visit.     Allergies:   Combigan [brimonidine tartrate-timolol] and Pneumococcal vaccines   Social History:  The patient  reports that she has never smoked. She has never used smokeless tobacco. She reports that she drinks alcohol. She reports that she does not use drugs.   Family History:  The patient's family history includes Breast cancer (age of onset: 72) in her daughter; Breast cancer (age of onset: 72) in her mother; Cancer in her sister; Heart attack in her brother.    ROS:  Please see the history of present illness.   Otherwise, review of systems is positive for fatigue, chest pressure, palpitations, joint swelling.   All other systems are reviewed and negative.    PHYSICAL EXAM: VS:  BP 126/84   Pulse (!) 114  Ht 5' 5.5" (1.664 m)   Wt 150 lb 6.4 oz (68.2 kg)   BMI 24.65 kg/m  , BMI Body mass index is 24.65 kg/m. GEN: Well nourished, well developed, in no acute distress  HEENT: normal  Neck: no JVD, carotid bruits, or masses Cardiac: tachycardic, irregular; no murmurs, rubs, or gallops,no edema  Respiratory:  clear to auscultation bilaterally, normal work of breathing GI: soft, nontender, nondistended, + BS MS: no deformity or atrophy  Skin: warm and dry Neuro:  Strength and sensation are intact Psych: euthymic mood, full affect  EKG:  EKG is ordered today. Personal review of the ekg ordered shows atrial fibrillation, rate 114, LAFB  Recent Labs: No  results found for requested labs within last 8760 hours.    Lipid Panel  No results found for: CHOL, TRIG, HDL, CHOLHDL, VLDL, LDLCALC, LDLDIRECT   Wt Readings from Last 3 Encounters:  02/04/16 150 lb 6.4 oz (68.2 kg)  04/23/15 156 lb (70.8 kg)  01/31/15 158 lb (71.7 kg)      Other studies Reviewed: Additional studies/ records that were reviewed today include: TTE 02/03/16  Review of the above records today demonstrates:  - Left ventricle: LVEF is approximately 15 to 20% with diffuse   hypokinesis. The cavity size was normal. Wall thickness was   normal. - Aortic valve: AV appears to be bicuspid. - Mitral valve: There was mild regurgitation. - Left atrium: The atrium was mildly to moderately dilated. - Right ventricle: Systolic function was mildly to moderately   reduced.   ASSESSMENT AND PLAN:  1.  Persistent atrial fibrillation: currently on Eliquis. Her rate is not well controlled today, and with her cardiomyopathy, which could be due to uncontrolled rates in atrial fibrillation, she would benefit from a rhythm control strategy. Laura Lee therefore start her on amiodarone today with a plan for cardioversion in 1 week.hopefully this Laura Lee help to improve her ejection fraction.  This patients CHA2DS2-VASc Score and unadjusted Ischemic Stroke Rate (% per year) is equal to 7.2 % stroke rate/year from a score of 5  Above score calculated as 1 point each if present [CHF, HTN, DM, Vascular=MI/PAD/Aortic Plaque, Age if 65-74, or Female] Above score calculated as 2 points each if present [Age > 75, or Stroke/TIA/TE]   2. Likely nonischemic cardiomyopathy: possibly due to her atrial fibrillation with rapid rates, which have potentially been going on for the last few months.her left atrium is mild to moderately dilated, is a good sign for getting her back into normal rhythm. Due to her cardiomyopathy, Laura Lee plan to start her on lisinopril and Toprol-XL. We'll stop her diltiazem and  HCTZ.    Current medicines are reviewed at length with the patient today.   The patient does not have concerns regarding her medicines.  The following changes were made today:  Start amiodarone, toprol XL, lisinopril, stop diltiazem, HCTZ  Labs/ tests ordered today include:  Orders Placed This Encounter  Procedures  . Basic metabolic panel  . CBC w/Diff  . EKG 12-Lead     Disposition:   FU with Jessen Siegman 3 months  Signed, Kae Lauman Meredith Leeds, MD  02/04/2016 4:47 PM     [place] [address] [phone] (office) [phone] (fax)

## 2016-02-15 ENCOUNTER — Encounter (HOSPITAL_COMMUNITY): Payer: Self-pay | Admitting: Cardiology

## 2016-03-08 NOTE — Progress Notes (Signed)
Cardiology Office Note Date:  03/09/2016  Patient ID:  Laura, Lee Nov 30, 1931, MRN [id number] PCP:  Mathews Argyle, MD  Cardiologist/Eleectrophysiologist: Dr. Curt Bears    Chief Complaint: s/p DCCV  History of Present Illness: Laura Lee is a 82 y.o. female with history of new PAFib s/p DCCV, HTN, breast and throid cancer both treated, comes in to the office today to be seen for Dr. Curt Bears, last seen by him in September, at that time, planned for DCCV and meds adjusted to include BB/ACE for her CM.  CM is suspect to be secondary to AF/RVR.  She has been unaware of her heart beat since the CV, though felt like she has had significant trouble sleeping.  She falls asleep though has "crazy dreams" and wakes.  She denies SOB or symptoms of PND or orthopnea, wakes about an hour after going to sleep and drifts in/out of sleep the rest of the night, though in the last week starting to improve.  No CP, palpitations or SOB. She is accompanied by her son, they report her as very active she exercises daily with aerobic/light weight type exercises as well as 1/2 daily of TaiChi, and very active around the house and yard.  She denies exertional intolerances   AF history Dx September 2017 02/14/16 DCCV to SR Amiodarone   Past Medical History:  Diagnosis Date  . Cancer (HCC)    Breast and thyroid cancer  . Glaucoma   . Hearing impaired    Hearing aids  . Hypertension   . Osteopenia 05/2013   T score -1.9 FRAX 15%/4.3%  . Thyroid disease    Cancer    Past Surgical History:  Procedure Laterality Date  . APPENDECTOMY    . BREAST SURGERY  1989   Lumpectomy right breast  . CARDIOVERSION N/A 02/14/2016   Procedure: CARDIOVERSION;  Surgeon: Sueanne Margarita, MD;  Location: [facility];  Service: Cardiovascular;  Laterality: N/A;  . Mole excised     Benign  . THYROID SURGERY     Removed 1 lobe  . TONSILLECTOMY      Current Outpatient Prescriptions  Medication Sig Dispense  Refill  . amiodarone (PACERONE) 200 MG tablet Take 1 tablet (200 mg total) by mouth daily. 30 tablet 3  . calcium citrate-vitamin D (CITRACAL+D) 315-200 MG-UNIT per tablet Take 1 tablet by mouth 2 (two) times daily.    . Cholecalciferol (VITAMIN D PO) Take by mouth.    Arne Cleveland 5 MG TABS tablet Take 5 mg by mouth 2 (two) times daily.    Marland Kitchen levothyroxine (SYNTHROID, LEVOTHROID) 88 MCG tablet Take 88 mcg by mouth daily before breakfast.    . lisinopril (PRINIVIL,ZESTRIL) 20 MG tablet Take 1 tablet (20 mg total) by mouth daily. 30 tablet 3  . LUMIGAN 0.01 % SOLN as needed.    . metoprolol succinate (TOPROL-XL) 50 MG 24 hr tablet Take 1 tablet (50 mg total) by mouth daily. Take with or immediately following a meal. 30 tablet 3   No current facility-administered medications for this visit.     Allergies:   Combigan [brimonidine tartrate-timolol] and Pneumococcal vaccines   Social History:  The patient  reports that she has never smoked. She has never used smokeless tobacco. She reports that she drinks alcohol. She reports that she does not use drugs.   Family History:  The patient's family history includes Breast cancer (age of onset: 35) in her daughter; Breast cancer (age of onset: 66) in her mother;  Cancer in her sister; Heart attack in her brother.  ROS:  Please see the history of present illness.  All other systems are reviewed and otherwise negative.   PHYSICAL EXAM:  VS:  BP (!) 146/80   Pulse 60   Ht 5' 5.5" (1.664 m)   Wt 150 lb (68 kg)   BMI 24.58 kg/m  BMI: Body mass index is 24.58 kg/m. Well nourished, well developed, in no acute distress  HEENT: normocephalic, atraumatic  Neck: no JVD, carotid bruits or masses Cardiac:  RRR no significant murmurs, no rubs, or gallops Lungs:  clear to auscultation bilaterally, no wheezing, rhonchi or rales  Abd: soft, nontender MS: no deformity or atrophy Ext: no edema  Skin: warm and dry, no rash Neuro:  No gross deficits  appreciated Psych: euthymic mood, full affect   EKG:  Done today and reviewed by myself shows SR, PAC  TTE 02/03/16  Review of the above records today demonstrates:  - Left ventricle: LVEF is approximately 15 to 20% with diffuse hypokinesis. The cavity size was normal. Wall thickness was normal. - Aortic valve: AV appears to be bicuspid. - Mitral valve: There was mild regurgitation. - Left atrium: The atrium was mildly to moderately dilated. - Right ventricle: Systolic function was mildly to moderately reduced.  Recent Labs: 02/11/2016: BUN 16; Creat 0.99; Hemoglobin 14.2; Platelets SEE NOTE; Potassium 4.2; Sodium 138  No results found for requested labs within last 8760 hours.   CrCl cannot be calculated (Patient's most recent lab result is older than the maximum 21 days allowed.).   Wt Readings from Last 3 Encounters:  03/09/16 150 lb (68 kg)  02/14/16 150 lb (68 kg)  02/04/16 150 lb 6.4 oz (68.2 kg)     Other studies reviewed: Additional studies/records reviewed today include: summarized above  ASSESSMENT AND PLAN:  1. PAFib     CHA2DS2Vasc is at least 5, on Eliquis     amio labs today  2. CM     Felt secondary to AF     fliud status stable by weight, exam, lack of symptoms     Continue BB/ACE  3. HTN     Stable  4. Insomnia     Improving on the last week     Encouraged to f/u with her PMD if it does not continue to improve   Disposition: given new to a/c, will check H/H today and get LFTs/TSH for her amiodarone as well.  F/u with echo in 3 months, Dr. Curt Bears in 12mo, sooner if needed  Current medicines are reviewed at length with the patient today.  The patient did not have any concerns regarding medicines.  Haywood Lasso, PA-C 03/09/2016 11:32 AM     [facility] [address] [place] [address] [phone] (office)  [phone] (fax)

## 2016-03-09 ENCOUNTER — Ambulatory Visit (INDEPENDENT_AMBULATORY_CARE_PROVIDER_SITE_OTHER): Payer: Medicare Other | Admitting: Physician Assistant

## 2016-03-09 VITALS — BP 146/80 | HR 60 | Ht 65.5 in | Wt 150.0 lb

## 2016-03-09 DIAGNOSIS — I4819 Other persistent atrial fibrillation: Secondary | ICD-10-CM

## 2016-03-09 DIAGNOSIS — Z5181 Encounter for therapeutic drug level monitoring: Secondary | ICD-10-CM

## 2016-03-09 DIAGNOSIS — I481 Persistent atrial fibrillation: Secondary | ICD-10-CM | POA: Diagnosis not present

## 2016-03-09 DIAGNOSIS — I429 Cardiomyopathy, unspecified: Secondary | ICD-10-CM | POA: Diagnosis not present

## 2016-03-09 DIAGNOSIS — I10 Essential (primary) hypertension: Secondary | ICD-10-CM | POA: Diagnosis not present

## 2016-03-09 DIAGNOSIS — Z79899 Other long term (current) drug therapy: Secondary | ICD-10-CM | POA: Diagnosis not present

## 2016-03-09 LAB — HEMOGLOBIN AND HEMATOCRIT, BLOOD
HCT: 44.3 % (ref 35.0–45.0)
Hemoglobin: 14.5 g/dL (ref 11.7–15.5)

## 2016-03-09 LAB — HEPATIC FUNCTION PANEL
ALT: 16 U/L (ref 6–29)
AST: 22 U/L (ref 10–35)
Albumin: 3.8 g/dL (ref 3.6–5.1)
Alkaline Phosphatase: 59 U/L (ref 33–130)
Bilirubin, Direct: 0.1 mg/dL (ref <=0.2)
Indirect Bilirubin: 0.5 mg/dL (ref 0.2–1.2)
Total Bilirubin: 0.6 mg/dL (ref 0.2–1.2)
Total Protein: 7 g/dL (ref 6.1–8.1)

## 2016-03-09 LAB — TSH: TSH: 3.48 mIU/L

## 2016-03-09 NOTE — Patient Instructions (Addendum)
Medication Instructions:   Your physician recommends that you continue on your current medications as directed. Please refer to the Current Medication list given to you today.   If you need a refill on your cardiac medications before your next appointment, please call your pharmacy.  Labwork: TSH AND LFT AND H AND H    Testing/Procedures: IN 3 MONTHS Your physician has requested that you have an echocardiogram. Echocardiography is a painless test that uses sound waves to create images of your heart. It provides your doctor with information about the size and shape of your heart and how well your heart's chambers and valves are working. This procedure takes approximately one hour. There are no restrictions for this procedure.     Follow-Up: Your physician wants you to follow-up in:  [place] will receive a reminder letter in the mail two months in advance. If you don't receive a letter, please call our office to schedule the follow-up appointment.        Any Other Special Instructions Will Be Listed Below (If Applicable).

## 2016-03-10 ENCOUNTER — Telehealth: Payer: Self-pay | Admitting: *Deleted

## 2016-03-10 NOTE — Telephone Encounter (Signed)
SPOKE TO PT ABOUT RESULTS AND VERBALIZED UNDERSTANDING  

## 2016-03-10 NOTE — Telephone Encounter (Signed)
-----   Message from [facility], [place] sent at 03/10/2016  6:27 AM EDT ----- Please let the patient know her labs look good, no changes.  Thanks [employer]

## 2016-03-23 DIAGNOSIS — Z23 Encounter for immunization: Secondary | ICD-10-CM | POA: Diagnosis not present

## 2016-04-03 DIAGNOSIS — H401211 Low-tension glaucoma, right eye, mild stage: Secondary | ICD-10-CM | POA: Diagnosis not present

## 2016-04-03 DIAGNOSIS — H401223 Low-tension glaucoma, left eye, severe stage: Secondary | ICD-10-CM | POA: Diagnosis not present

## 2016-05-08 ENCOUNTER — Ambulatory Visit: Payer: Medicare Other | Admitting: Cardiology

## 2016-05-08 ENCOUNTER — Encounter: Payer: Self-pay | Admitting: Cardiology

## 2016-05-08 ENCOUNTER — Ambulatory Visit (INDEPENDENT_AMBULATORY_CARE_PROVIDER_SITE_OTHER): Payer: Medicare Other | Admitting: Cardiology

## 2016-05-08 VITALS — BP 152/94 | HR 70 | Ht 66.0 in | Wt 148.0 lb

## 2016-05-08 DIAGNOSIS — I509 Heart failure, unspecified: Secondary | ICD-10-CM

## 2016-05-08 DIAGNOSIS — I4819 Other persistent atrial fibrillation: Secondary | ICD-10-CM

## 2016-05-08 DIAGNOSIS — I481 Persistent atrial fibrillation: Secondary | ICD-10-CM | POA: Diagnosis not present

## 2016-05-08 MED ORDER — CARVEDILOL 6.25 MG PO TABS: 6.2500 mg | ORAL_TABLET | Freq: Two times a day (BID) | ORAL | 3 refills | Status: DC

## 2016-05-08 MED ORDER — SACUBITRIL-VALSARTAN 24-26 MG PO TABS: 1.0000 | ORAL_TABLET | Freq: Two times a day (BID) | ORAL | Status: DC

## 2016-05-08 NOTE — Patient Instructions (Addendum)
Medication Instructions: 1) Stop Lisinopril 2) Start Entersto 24-26 mg twice daily  - start this medication on the evening of 05/10/16 3) Stop Metoprolol 4) Start Coreg 6.25 mg twice daily  Labegwork: None Ordered   Procedures/Testing: Keep your currently scheduled Echocardiogram on 06/08/2016  Follow-Up: Your physician recommends that you schedule a follow-up appointment in: 2 weeks with pharmacist for Entresto follow up.  Your physician recommends that you schedule a follow-up appointment in: 3 months with Dr. Curt Bears.  If you need a refill on your cardiac medications before your next appointment, please call your pharmacy.   Thank you for choosing [facility]!!   Trinidad Curet, RN [phone]  Any Additional Special Instructions Will Be Listed Below (If Applicable).  Sacubitril; Valsartan oral tablet What is this medicine? SACUBITRIL; VALSARTAN (sak UE bi tril; val SAR tan) is a combination of 2 drugs used to reduce the risk of death and hospitalizations in people with long-lasting heart failure. It is usually used with other medicines to treat heart failure. COMMON BRAND NAME(S): Entresto What should I tell my health care provider before I take this medicine? They need to know if you have any of these conditions: -diabetes and take a medicine that contains aliskiren -kidney disease -liver disease -an unusual or allergic reaction to sacubitril; valsartan, drugs called angiotensin converting enzyme (ACE) inhibitors, angiotensin II receptor blockers (ARBs), other medicines, foods, dyes, or preservatives -pregnant or trying to get pregnant -breast-feeding How should I use this medicine? Take this medicine by mouth with a glass of water. Follow the directions on the prescription label. You can take it with or without food. If it upsets your stomach, take it with food. Take your medicine at regular intervals. Do not take it more often than directed. Do not stop taking except  on your doctor's advice. Do not take this medicine for at least 36 hours before or after you take an ACE inhibitor medicine. Talk to your health care provider if you are not sure if you take an ACE inhibitor. Talk to your pediatrician regarding the use of this medicine in children. Special care may be needed. What if I miss a dose? If you miss a dose, take it as soon as you can. If it is almost time for next dose, take only that dose. Do not take double or extra doses. What may interact with this medicine? Do not take this medicine with any of the following medicines: -aliskiren if you have diabetes -angiotensin-converting enzyme (ACE) inhibitors, like benazepril, captopril, enalapril, fosinopril, lisinopril, or ramipril This medicine may also interact with the following medicines: -angiotensin II receptor blockers (ARBs) like azilsartan, candesartan, eprosartan, irbesartan, losartan, olmesartan, telmisartan, or valsartan -lithium -NSAIDS, medicines for pain and inflammation, like ibuprofen or naproxen -potassium-sparing diuretics like amiloride, spironolactone, and triamterene -potassium supplements What should I watch for while using this medicine? Tell your doctor or healthcare professional if your symptoms do not start to get better or if they get worse. Do not become pregnant while taking this medicine. Women should inform their doctor if they wish to become pregnant or think they might be pregnant. There is a potential for serious side effects to an unborn child. Talk to your health care professional or pharmacist for more information. You may get dizzy. Do not drive, use machinery, or do anything that needs mental alertness until you know how this medicine affects you. Do not stand or sit up quickly, especially if you are an older patient. This reduces the risk  of dizzy or fainting spells. Avoid alcoholic drinks; they can make you more dizzy. What side effects may I notice from receiving  this medicine? Side effects that you should report to your doctor or health care professional as soon as possible: -allergic reactions like skin rash, itching or hives, swelling of the face, lips, or tongue -signs and symptoms of increased potassium like muscle weakness; chest pain; or fast, irregular heartbeat -signs and symptoms of kidney injury like trouble passing urine or change in the amount of urine -signs and symptoms of low blood pressure like feeling dizzy or lightheaded, or if you develop extreme fatigue Side effects that usually do not require medical attention (report to your doctor or health care professional if they continue or are bothersome): -cough Where should I keep my medicine? Keep out of the reach of children. Store at room temperature between 15 and 30 degrees C (59 and 86 degrees F). Throw away any unused medicine after the expiration date.  2017 Elsevier/Gold Standard (2015-06-26 13:54:19)  Carvedilol tablets What is this medicine? CARVEDILOL (KAR ve dil ol) is a beta-blocker. Beta-blockers reduce the workload on the heart and help it to beat more regularly. This medicine is used to treat high blood pressure and heart failure. This medicine may be used for other purposes; ask your health care provider or pharmacist if you have questions. COMMON BRAND NAME(S): Coreg What should I tell my health care provider before I take this medicine? They need to know if you have any of these conditions: -circulation problems -diabetes -history of heart attack or heart disease -liver disease -lung or breathing disease, like asthma or emphysema -pheochromocytoma -slow or irregular heartbeat -thyroid disease -an unusual or allergic reaction to carvedilol, other beta-blockers, medicines, foods, dyes, or preservatives -pregnant or trying to get pregnant -breast-feeding How should I use this medicine? Take this medicine by mouth with a glass of water. Follow the directions on  the prescription label. It is best to take the tablets with food. Take your doses at regular intervals. Do not take your medicine more often than directed. Do not stop taking except on the advice of your doctor or health care professional. Talk to your pediatrician regarding the use of this medicine in children. Special care may be needed. Overdosage: If you think you have taken too much of this medicine contact a poison control center or emergency room at once. NOTE: This medicine is only for you. Do not share this medicine with others. What if I miss a dose? If you miss a dose, take it as soon as you can. If it is almost time for your next dose, take only that dose. Do not take double or extra doses. What may interact with this medicine? This medicine may interact with the following medications: -certain medicines for blood pressure, heart disease, irregular heart beat -certain medicines for depression, like fluoxetine or paroxetine -certain medicines for diabetes, like glipizide or glyburide -cimetidine -clonidine -cyclosporine -digoxin -MAOIs like Carbex, Eldepryl, Marplan, Nardil, and Parnate -reserpine -rifampin This list may not describe all possible interactions. Give your health care provider a list of all the medicines, herbs, non-prescription drugs, or dietary supplements you use. Also tell them if you smoke, drink alcohol, or use illegal drugs. Some items may interact with your medicine. What should I watch for while using this medicine? Check your heart rate and blood pressure regularly while you are taking this medicine. Ask your doctor or health care professional what your heart rate and blood  pressure should be, and when you should contact him or her. Do not stop taking this medicine suddenly. This could lead to serious heart-related effects. Contact your doctor or health care professional if you have difficulty breathing while taking this drug. Check your weight daily. Ask your  doctor or health care professional when you should notify him/her of any weight gain. You may get drowsy or dizzy. Do not drive, use machinery, or do anything that requires mental alertness until you know how this medicine affects you. To reduce the risk of dizzy or fainting spells, do not sit or stand up quickly. Alcohol can make you more drowsy, and increase flushing and rapid heartbeats. Avoid alcoholic drinks. If you have diabetes, check your blood sugar as directed. Tell your doctor if you have changes in your blood sugar while you are taking this medicine. If you are going to have surgery, tell your doctor or health care professional that you are taking this medicine. What side effects may I notice from receiving this medicine? Side effects that you should report to your doctor or health care professional as soon as possible: -allergic reactions like skin rash, itching or hives, swelling of the face, lips, or tongue -breathing problems -dark urine -irregular heartbeat -swollen legs or ankles -vomiting -yellowing of the eyes or skin Side effects that usually do not require medical attention (report to your doctor or health care professional if they continue or are bothersome): -change in sex drive or performance -diarrhea -dry eyes (especially if wearing contact lenses) -dry, itching skin -headache -nausea -unusually tired This list may not describe all possible side effects. Call your doctor for medical advice about side effects. You may report side effects to FDA at 1-800-FDA-1088. Where should I keep my medicine? Keep out of the reach of children. Store at room temperature below 30 degrees C (86 degrees F). Protect from moisture. Keep container tightly closed. Throw away any unused medicine after the expiration date. NOTE: This sheet is a summary. It may not cover all possible information. If you have questions about this medicine, talk to your doctor, pharmacist, or health care  provider.  2017 Elsevier/Gold Standard (2013-01-15 14:12:02)

## 2016-05-08 NOTE — Progress Notes (Signed)
Electrophysiology Office Note   Date:  05/08/2016   ID:  Laura Lee, DOB [birth year], MRN [id number]  PCP:  Laura Argyle, MD  Primary Electrophysiologist:  Sally- Wamble Meredith Leeds, MD    Chief Complaint  Patient presents with  . Follow-up    Persistent AFib     History of Present Illness: Laura Lee is a 82 y.o. female who presents today for electrophysiology evaluation.   She was found to be in new onset atrial fibrillation with rapid ventricular response. She takes Eliquis for anticoagulation. Had cardioversion 02/14/16 and was started on amiodarone.   Today, she denies symptoms of palpitations, chest pain, shortness of breath, orthopnea, PND, lower extremity edema, claudication, dizziness, presyncope, syncope, bleeding, or neurologic sequela. The patient is tolerating medications without difficulties. She does say that she has been much more fatigued over the last few weeks. She says that she is not doing some of the daily activities the ordinarily would do. She does say that she is more fatigued than she was when she was in atrial fibrillation last summer.  Past Medical History:  Diagnosis Date  . Cancer (HCC)    Breast and thyroid cancer  . Glaucoma   . Hearing impaired    Hearing aids  . Hypertension   . Osteopenia 05/2013   T score -1.9 FRAX 15%/4.3%  . Thyroid disease    Cancer   Past Surgical History:  Procedure Laterality Date  . APPENDECTOMY    . BREAST SURGERY  1989   Lumpectomy right breast  . CARDIOVERSION N/A 02/14/2016   Procedure: CARDIOVERSION;  Surgeon: Laura Margarita, MD;  Location: [facility];  Service: Cardiovascular;  Laterality: N/A;  . Mole excised     Benign  . THYROID SURGERY     Removed 1 lobe  . TONSILLECTOMY       Current Outpatient Prescriptions  Medication Sig Dispense Refill  . amiodarone (PACERONE) 200 MG tablet Take 1 tablet (200 mg total) by mouth daily. 30 tablet 3  . calcium citrate-vitamin D (CITRACAL+D) 315-200  MG-UNIT per tablet Take 1 tablet by mouth 2 (two) times daily.    . Cholecalciferol (VITAMIN D PO) Take 1 tablet by mouth daily.     Marland Kitchen ELIQUIS 5 MG TABS tablet Take 5 mg by mouth 2 (two) times daily.    Marland Kitchen levothyroxine (SYNTHROID, LEVOTHROID) 88 MCG tablet Take 88 mcg by mouth daily before breakfast.    . lisinopril (PRINIVIL,ZESTRIL) 20 MG tablet Take 1 tablet (20 mg total) by mouth daily. 30 tablet 3  . LUMIGAN 0.01 % SOLN Place 1 drop into the left eye at bedtime.     . metoprolol succinate (TOPROL-XL) 50 MG 24 hr tablet Take 1 tablet (50 mg total) by mouth daily. Take with or immediately following a meal. 30 tablet 3   No current facility-administered medications for this visit.     Allergies:   Combigan [brimonidine tartrate-timolol] and Pneumococcal vaccines   Social History:  The patient  reports that she has never smoked. She has never used smokeless tobacco. She reports that she drinks alcohol. She reports that she does not use drugs.   Family History:  The patient's family history includes Breast cancer (age of onset: 46) in her daughter; Breast cancer (age of onset: 70) in her mother; Cancer in her sister; Heart attack in her brother.    ROS:  Please see the history of present illness.   Otherwise, review of systems is positive for fatigue, cough.  All other systems are reviewed and positive for fatigue, cough.    PHYSICAL EXAM: VS:  BP (!) 152/94   Pulse 70   Ht 5\' 6"  (1.676 m)   Wt 148 lb (67.1 kg)   BMI 23.89 kg/m  , BMI Body mass index is 23.89 kg/m. GEN: Well nourished, well developed, in no acute distress  HEENT: normal  Neck: no JVD, carotid bruits, or masses Cardiac: RRR; no murmurs, rubs, or gallops,no edema  Respiratory:  clear to auscultation bilaterally, normal work of breathing GI: soft, nontender, nondistended, + BS MS: no deformity or atrophy  Skin: warm and dry Neuro:  Strength and sensation are intact Psych: euthymic mood, full affect  EKG:  EKG is  not ordered today. Personal review of the ekg ordered shows sinus rhythm, septal infarct, rate [age]-year-old is a her  Recent Labs: 02/11/2016: BUN 16; Creat 0.99; Platelets SEE NOTE; Potassium 4.2; Sodium 138 03/09/2016: ALT 16; Hemoglobin 14.5; TSH 3.48    Lipid Panel  No results found for: CHOL, TRIG, HDL, CHOLHDL, VLDL, LDLCALC, LDLDIRECT   Wt Readings from Last 3 Encounters:  05/08/16 148 lb (67.1 kg)  03/09/16 150 lb (68 kg)  02/14/16 150 lb (68 kg)      Other studies Reviewed: Additional studies/ records that were reviewed today include: TTE 02/03/16  Review of the above records today demonstrates:  - Left ventricle: LVEF is approximately 15 to 20% with diffuse   hypokinesis. The cavity size was normal. Wall thickness was   normal. - Aortic valve: AV appears to be bicuspid. - Mitral valve: There was mild regurgitation. - Left atrium: The atrium was mildly to moderately dilated. - Right ventricle: Systolic function was mildly to moderately   reduced.   ASSESSMENT AND PLAN:  1.  Persistent atrial fibrillation: currently on Eliquis. Recent cardioversion 02/14/16 and was started on amiodarone. Appears to be back in sinus rhythm today. We'll continue amiodarone.  This patients CHA2DS2-VASc Score and unadjusted Ischemic Stroke Rate (% per year) is equal to 7.2 % stroke rate/year from a score of 5  Above score calculated as 1 point each if present [CHF, HTN, DM, Vascular=MI/PAD/Aortic Plaque, Age if 65-74, or Female] Above score calculated as 2 points each if present [Age > 75, or Stroke/TIA/TE]   2. Likely nonischemic cardiomyopathy: repeat TTE scheduled for January. She is having symptoms of fatigue. I Laura Lee switch her from her metoprolol to carvedilol 6.25 mg. We'll also stop her lisinopril and start her on Entresto.    Current medicines are reviewed at length with the patient today.   The patient does not have concerns regarding her medicines.  The following changes  were made today:  Stop lisinopril,  Entresto; stop metoprolol, Start carvedilol  Labs/ tests ordered today include:  No orders of the defined types were placed in this encounter.    Disposition:   FU with Analyce Tavares 3 months  Signed, Cassanda Walmer Meredith Leeds, MD  05/08/2016 9:33 AM     [facility] [address] [address] [phone] (office) [phone] (fax)

## 2016-05-26 ENCOUNTER — Ambulatory Visit (INDEPENDENT_AMBULATORY_CARE_PROVIDER_SITE_OTHER): Payer: Medicare Other | Admitting: Pharmacist

## 2016-05-26 ENCOUNTER — Encounter: Payer: Self-pay | Admitting: Pharmacist

## 2016-05-26 DIAGNOSIS — I10 Essential (primary) hypertension: Secondary | ICD-10-CM | POA: Insufficient documentation

## 2016-05-26 MED ORDER — METOPROLOL SUCCINATE ER 25 MG PO TB24: 25.0000 mg | ORAL_TABLET | Freq: Every day | ORAL | 11 refills | Status: DC

## 2016-05-26 MED ORDER — SACUBITRIL-VALSARTAN 24-26 MG PO TABS
1.0000 | ORAL_TABLET | Freq: Two times a day (BID) | ORAL | 11 refills | Status: DC
Start: 2016-05-26 — End: 2016-12-22

## 2016-05-26 NOTE — Patient Instructions (Addendum)
Continue with Entresto twice a day.  Stop taking carvedilol. Resume taking metoprolol (Toprol) at a lower dose to see if this helps with your fatigue. You can cut your 50mg  tablets that you already have at home in half until you run out. Then, please pick up Toprol 25mg  tablets to start taking 1 tablet daily.  Follow up with your primary care doctor.  Follow up in 2 weeks for blood pressure check same day as your Echo.

## 2016-05-26 NOTE — Progress Notes (Signed)
Patient ID: LAWSYN [place]                 DOB: 1931-07-09                      MRN: [id number]     HPI: [place] is a 83 y.o. female referred by Dr. Curt Bears to HTN clinic for [facility] titration. PMH is significant for afib and HFrEF with LVEF 15-20% on 01/2016 Echo. Pt was switched from lisinopril to Entresto and from metoprolol to carvedilol 2 weeks ago. Pt presents today with her son for follow up.  Pt reports that she has felt horrible for the past few weeks. Her fatigue has been worse and she has noticed memory issues in addition to her hair falling out. She is also having problems with her nails. TSH checked 2 months ago and in range. She does not check her BP at home. Issue with [place] and rx was not sent to pharmacy. Pt did receive samples and has been compliant with these.  Current HTN meds: Entresto 24-26mg  BID, carvedilol 6.25mg  BID Previously tried: metoprolol - fatigue BP goal: <140/66mmHg   Family History: The patient's family history includes Breast cancer (age of onset: 44) in her daughter; Breast cancer (age of onset: 52) in her mother; Cancer in her sister; Heart attack in her brother.   Social History: The patient  reports that she has never smoked. She has never used smokeless tobacco. She reports that she drinks alcohol. She reports that she does not use drugs.   Wt Readings from Last 3 Encounters:  05/08/16 148 lb (67.1 kg)  03/09/16 150 lb (68 kg)  02/14/16 150 lb (68 kg)   BP Readings from Last 3 Encounters:  05/08/16 (!) 152/94  03/09/16 (!) 146/80  02/14/16 114/86   Pulse Readings from Last 3 Encounters:  05/08/16 70  03/09/16 60  02/14/16 (!) 53    Renal function: CrCl cannot be calculated (Patient's most recent lab result is older than the maximum 21 days allowed.).  Past Medical History:  Diagnosis Date  . Cancer (HCC)    Breast and thyroid cancer  . Glaucoma   . Hearing impaired    Hearing aids  . Hypertension   . Osteopenia 05/2013   T  score -1.9 FRAX 15%/4.3%  . Thyroid disease    Cancer    Current Outpatient Prescriptions on File Prior to Visit  Medication Sig Dispense Refill  . amiodarone (PACERONE) 200 MG tablet Take 1 tablet (200 mg total) by mouth daily. 30 tablet 3  . calcium citrate-vitamin D (CITRACAL+D) 315-200 MG-UNIT per tablet Take 1 tablet by mouth 2 (two) times daily.    . carvedilol (COREG) 6.25 MG tablet Take 1 tablet (6.25 mg total) by mouth 2 (two) times daily. 60 tablet 3  . Cholecalciferol (VITAMIN D PO) Take 1 tablet by mouth daily.     Marland Kitchen ELIQUIS 5 MG TABS tablet Take 5 mg by mouth 2 (two) times daily.    Marland Kitchen levothyroxine (SYNTHROID, LEVOTHROID) 88 MCG tablet Take 88 mcg by mouth daily before breakfast.    . LUMIGAN 0.01 % SOLN Place 1 drop into the left eye at bedtime.      Current Facility-Administered Medications on File Prior to Visit  Medication Dose Route Frequency Provider Last Rate Last Dose  . sacubitril-valsartan (ENTRESTO) 24-26 mg per tablet  1 tablet Oral BID Will Meredith Leeds, MD        Allergies  Allergen Reactions  . Combigan [Brimonidine Tartrate-Timolol]   . Pneumococcal Vaccines      Assessment/Plan:  1. Hypertension/HF med titration - BP at goal <140/24mmHg however pt with multiple complaints potentially related to medication side effects. Fatigue and hair loss may be related to switch in beta blocker therapy, will stop carvedilol and switch back to Toprol but at a lower dose to see if this helps with fatigue. Will continue Entresto for now. Checking BMET today and will f/u in 2 weeks when pt is scheduled for an echo. If she is still having side effects, will switch Entresto back to lisinopril. Advised pt to follow up with her PCP as well regarding nail and memory issues.   Anselm Aumiller E. Takota Cahalan, PharmD, CPP, [place] [id number] [place], [place] [address] Phone: [phone]; Fax: [phone] 05/26/2016 10:02 AM

## 2016-05-27 LAB — BASIC METABOLIC PANEL
BUN/Creatinine Ratio: 17 (ref 12–28)
BUN: 14 mg/dL (ref 8–27)
CO2: 27 mmol/L (ref 18–29)
Calcium: 9 mg/dL (ref 8.7–10.3)
Chloride: 102 mmol/L (ref 96–106)
Creatinine, Ser: 0.84 mg/dL (ref 0.57–1.00)
GFR calc Af Amer: 75 mL/min/{1.73_m2} (ref >59)
GFR calc non Af Amer: 65 mL/min/{1.73_m2} (ref >59)
Glucose: 93 mg/dL (ref 65–99)
Potassium: 4.2 mmol/L (ref 3.5–5.2)
Sodium: 143 mmol/L (ref 134–144)

## 2016-06-08 ENCOUNTER — Ambulatory Visit (HOSPITAL_COMMUNITY): Payer: Medicare Other | Attending: Cardiovascular Disease

## 2016-06-08 ENCOUNTER — Ambulatory Visit (INDEPENDENT_AMBULATORY_CARE_PROVIDER_SITE_OTHER): Payer: Medicare Other | Admitting: Pharmacist

## 2016-06-08 ENCOUNTER — Telehealth: Payer: Self-pay | Admitting: *Deleted

## 2016-06-08 ENCOUNTER — Other Ambulatory Visit: Payer: Self-pay

## 2016-06-08 VITALS — BP 134/76 | HR 66

## 2016-06-08 DIAGNOSIS — I351 Nonrheumatic aortic (valve) insufficiency: Secondary | ICD-10-CM | POA: Diagnosis not present

## 2016-06-08 DIAGNOSIS — R9439 Abnormal result of other cardiovascular function study: Secondary | ICD-10-CM | POA: Insufficient documentation

## 2016-06-08 DIAGNOSIS — I501 Left ventricular failure: Secondary | ICD-10-CM | POA: Diagnosis not present

## 2016-06-08 DIAGNOSIS — I340 Nonrheumatic mitral (valve) insufficiency: Secondary | ICD-10-CM | POA: Insufficient documentation

## 2016-06-08 DIAGNOSIS — I429 Cardiomyopathy, unspecified: Secondary | ICD-10-CM | POA: Diagnosis not present

## 2016-06-08 DIAGNOSIS — I10 Essential (primary) hypertension: Secondary | ICD-10-CM

## 2016-06-08 LAB — ECHOCARDIOGRAM COMPLETE

## 2016-06-08 MED ORDER — AMIODARONE HCL 200 MG PO TABS: 200.0000 mg | ORAL_TABLET | Freq: Every day | ORAL | 3 refills | Status: DC

## 2016-06-08 NOTE — Telephone Encounter (Signed)
Refills sent to pharmacy. 

## 2016-06-08 NOTE — Patient Instructions (Signed)
Continue your current medications, your blood pressure was excellent today!  Call clinic with any problems [phone].

## 2016-06-08 NOTE — Telephone Encounter (Signed)
-----   Message from Leeroy Bock, [facility] sent at 06/08/2016 11:08 AM EST ----- Ines Bloomer,  I saw mutual patient Ms Rusk in clinic today and she mentioned that she needed a refill on her amiodarone. I wanted to route the request to you since the original fill only had 3 refills and I wasn't sure if you guys needed any other follow up before refilling it.  Thanks! Jinny Blossom

## 2016-06-08 NOTE — Progress Notes (Signed)
Patient ID: [place]                 DOB: 1931-08-30                      MRN: [id number]     HPI: [place] is a 83 y.o. female referred by Dr. Curt Bears to HTN clinic for [facility] titration. PMH is significant for afib and HFrEF with LVEF 15-20% on 01/2016 Echo. Pt was switched from lisinopril to Entresto and from metoprolol to carvedilol 2 weeks ago. Pt presented for follow up 2 weeks ago with complaints of fatigue, hair loss, nail, and memory problems. Switched back from carvedilol to Toprol at lower dose to see if it would help with fatigue and hair loss. Pt presents today for follow up.  Pt reports that her memory and her fatigue have both improved. She feels better back on Toprol at a lower dose. She has been compliant with medications. Reports issues with her nail growth still. Recent TSH normal.  Current HTN meds: Entresto 24-26mg  BID, Toprol 25mg  daily Previously tried: Toprol 50mg  and carvedilol - fatigue BP goal: <140/51mmHg   Family History: The patient's family history includes Breast cancer (age of onset: 16) in her daughter; Breast cancer (age of onset: 19) in her mother; Cancer in her sister; Heart attack in her brother.  Social History: The patient reports that she has never smoked. She has never used smokeless tobacco. She reports that she drinks alcohol. She reports that she does not use drugs.  Wt Readings from Last 3 Encounters:  05/08/16 148 lb (67.1 kg)  03/09/16 150 lb (68 kg)  02/14/16 150 lb (68 kg)   BP Readings from Last 3 Encounters:  05/26/16 140/82  05/08/16 (!) 152/94  03/09/16 (!) 146/80   Pulse Readings from Last 3 Encounters:  05/26/16 (!) 59  05/08/16 70  03/09/16 60    Renal function: CrCl cannot be calculated (Unknown ideal weight.).  Past Medical History:  Diagnosis Date  . Cancer (HCC)    Breast and thyroid cancer  . Glaucoma   . Hearing impaired    Hearing aids  . Hypertension   . Osteopenia 05/2013   T score -1.9 FRAX  15%/4.3%  . Thyroid disease    Cancer    Current Outpatient Prescriptions on File Prior to Visit  Medication Sig Dispense Refill  . amiodarone (PACERONE) 200 MG tablet Take 1 tablet (200 mg total) by mouth daily. 30 tablet 3  . calcium citrate-vitamin D (CITRACAL+D) 315-200 MG-UNIT per tablet Take 1 tablet by mouth 2 (two) times daily.    . Cholecalciferol (VITAMIN D PO) Take 1 tablet by mouth daily.     Marland Kitchen ELIQUIS 5 MG TABS tablet Take 5 mg by mouth 2 (two) times daily.    Marland Kitchen levothyroxine (SYNTHROID, LEVOTHROID) 88 MCG tablet Take 88 mcg by mouth daily before breakfast.    . LUMIGAN 0.01 % SOLN Place 1 drop into the left eye at bedtime.     . metoprolol succinate (TOPROL XL) 25 MG 24 hr tablet Take 1 tablet (25 mg total) by mouth daily. 30 tablet 11  . sacubitril-valsartan (ENTRESTO) 24-26 MG Take 1 tablet by mouth 2 (two) times daily. 60 tablet 11   No current facility-administered medications on file prior to visit.     Allergies  Allergen Reactions  . Combigan [Brimonidine Tartrate-Timolol]   . Pneumococcal Vaccines      Assessment/Plan:  1. Hypertension -  BP at goal <140/69mmHg and fatigue has improved since switching pt back to Toprol at a lower dose. No medication changes today. Advised pt to call clinic with any problems.   Jillaine Waren E. Marcoantonio Legault, PharmD, CPP, [place] [id number] [place], [place] [address] Phone: [phone]; Fax: [phone] 06/08/2016 11:14 AM

## 2016-06-09 ENCOUNTER — Telehealth: Payer: Self-pay | Admitting: *Deleted

## 2016-06-09 NOTE — Telephone Encounter (Signed)
-----   Message from [place], [place] sent at 06/09/2016  3:02 PM EST ----- Please let the patient know that her echo looked much better, heart muscle strength is back to normal.  Thanks Joseph Art

## 2016-06-09 NOTE — Telephone Encounter (Signed)
PT NOTIFIED OF RESULTS

## 2016-06-15 ENCOUNTER — Encounter: Payer: Self-pay | Admitting: Gynecology

## 2016-06-15 ENCOUNTER — Ambulatory Visit (INDEPENDENT_AMBULATORY_CARE_PROVIDER_SITE_OTHER): Payer: Medicare Other | Admitting: Gynecology

## 2016-06-15 VITALS — BP 124/80 | Ht 66.0 in | Wt 150.0 lb

## 2016-06-15 DIAGNOSIS — Z01411 Encounter for gynecological examination (general) (routine) with abnormal findings: Secondary | ICD-10-CM

## 2016-06-15 DIAGNOSIS — N952 Postmenopausal atrophic vaginitis: Secondary | ICD-10-CM

## 2016-06-15 NOTE — Patient Instructions (Signed)

## 2016-06-15 NOTE — Progress Notes (Signed)
    [place] 1931-12-16 [id number]        83 y.o.  [id number] for breast and pelvic exam  Past medical history,surgical history, problem list, medications, allergies, family history and social history were all reviewed and documented as reviewed in the EPIC chart.  ROS:  Performed with pertinent positives and negatives included in the history, assessment and plan.   Additional significant findings :  None   Exam: Caryn Bee assistant Vitals:   06/15/16 1114  BP: 124/80  Weight: 150 lb (68 kg)  Height: 5\' 6"  (1.676 m)   Body mass index is 24.21 kg/m.  General appearance:  Normal affect, orientation and appearance. Skin: Grossly normal HEENT: Without gross lesions.  No cervical or supraclavicular adenopathy. Thyroid normal.  Lungs:  Clear without wheezing, rales or rhonchi Cardiac: RR, without RMG Abdominal:  Soft, nontender, without masses, guarding, rebound, organomegaly or hernia Breasts:  Examined lying and sitting without masses, retractions, discharge or axillary adenopathy. Pelvic:  Ext, BUS, Vagina with atrophic changes  Cervix with atrophic changes  Uterus anteverted, normal size, shape and contour, midline and mobile nontender   Adnexa without masses or tenderness    Anus and perineum normal   Rectovaginal normal sphincter tone without palpated masses or tenderness.    Assessment/Plan:  83 y.o. [id number] female for breast and pelvic exam.   1. Postmenopausal/atrophic genital changes. No significant hot flushes, night sweats, vaginal dryness or any vaginal bleeding. Continue to monitor and report any issues. 2. Osteopenia. DEXA 2015 T score -1.9. FRAX 15%/4%. Discussed repeating her DEXA and she relates that Dr. Felipa Eth has arranged for through his office. She'll follow up with them in reference to this. Increased calcium vitamin D. 3. Pap smear 2013. No Pap smear done today. No history of abnormal Pap smears. We both agree to stop screening per current screening  guidelines based on age. 4. Colonoscopy 2012. Repeat at their recommended interval. 5. Mammography 10/2015. Will continue with annual mammography when due. SBE monthly reviewed. 6. Health maintenance. No routine lab work done as this is done elsewhere. Follow up 1 year, sooner as needed.   Anastasio Auerbach MD, 11:36 AM 06/15/2016

## 2016-07-27 DIAGNOSIS — M25561 Pain in right knee: Secondary | ICD-10-CM | POA: Diagnosis not present

## 2016-07-27 DIAGNOSIS — I10 Essential (primary) hypertension: Secondary | ICD-10-CM | POA: Diagnosis not present

## 2016-07-27 DIAGNOSIS — E039 Hypothyroidism, unspecified: Secondary | ICD-10-CM | POA: Diagnosis not present

## 2016-07-27 DIAGNOSIS — I480 Paroxysmal atrial fibrillation: Secondary | ICD-10-CM | POA: Diagnosis not present

## 2016-07-29 DIAGNOSIS — M1711 Unilateral primary osteoarthritis, right knee: Secondary | ICD-10-CM | POA: Diagnosis not present

## 2016-07-29 DIAGNOSIS — M1712 Unilateral primary osteoarthritis, left knee: Secondary | ICD-10-CM | POA: Diagnosis not present

## 2016-07-31 DIAGNOSIS — H401223 Low-tension glaucoma, left eye, severe stage: Secondary | ICD-10-CM | POA: Diagnosis not present

## 2016-08-10 ENCOUNTER — Ambulatory Visit (INDEPENDENT_AMBULATORY_CARE_PROVIDER_SITE_OTHER): Payer: Medicare Other | Admitting: Cardiology

## 2016-08-10 ENCOUNTER — Encounter: Payer: Self-pay | Admitting: *Deleted

## 2016-08-10 ENCOUNTER — Telehealth: Payer: Self-pay | Admitting: Cardiology

## 2016-08-10 ENCOUNTER — Encounter: Payer: Self-pay | Admitting: Cardiology

## 2016-08-10 ENCOUNTER — Other Ambulatory Visit: Payer: Self-pay | Admitting: Cardiology

## 2016-08-10 VITALS — BP 142/96 | HR 58 | Ht 66.0 in | Wt 149.4 lb

## 2016-08-10 DIAGNOSIS — I481 Persistent atrial fibrillation: Secondary | ICD-10-CM

## 2016-08-10 DIAGNOSIS — Z79899 Other long term (current) drug therapy: Secondary | ICD-10-CM | POA: Diagnosis not present

## 2016-08-10 DIAGNOSIS — Z01812 Encounter for preprocedural laboratory examination: Secondary | ICD-10-CM

## 2016-08-10 DIAGNOSIS — I4892 Unspecified atrial flutter: Secondary | ICD-10-CM | POA: Diagnosis not present

## 2016-08-10 DIAGNOSIS — I4819 Other persistent atrial fibrillation: Secondary | ICD-10-CM

## 2016-08-10 MED ORDER — CARVEDILOL 3.125 MG PO TABS: 3.1250 mg | ORAL_TABLET | Freq: Two times a day (BID) | ORAL | 3 refills | Status: DC

## 2016-08-10 NOTE — Telephone Encounter (Signed)
Patient states she does not have a rx for Carvedilol. States she is not taking this medication but has taken Toprol before. Will discuss w/ Dr. Curt Bears and get back with her. She is agreeable to plan.

## 2016-08-10 NOTE — Progress Notes (Addendum)
Electrophysiology Office Note   Date:  08/10/2016   ID:  [place], DOB 10-07-1931, MRN [id number]  PCP:  Mathews Argyle, MD  Primary Electrophysiologist:  Casper Pagliuca Meredith Leeds, MD    Chief Complaint  Patient presents with  . Follow-up    Persistent Afib     History of Present Illness: [place] is a 83 y.o. female who presents today for electrophysiology evaluation.   She was found to be in new onset atrial fibrillation with rapid ventricular response. She takes Eliquis for anticoagulation. Had cardioversion 02/14/16 and was started on amiodarone.  He returns to clinic today complaining of visual disturbance and fatigue. She says that her fatigue began approximately a month ago. She has not been able to get up and do all the things he needs. She is also having some episodes of blurry vision.   Today, she denies symptoms of palpitations, chest pain, shortness of breath, orthopnea, PND, lower extremity edema, claudication, dizziness, presyncope, syncope, bleeding, or neurologic sequela. The patient is tolerating medications without difficulties.   Past Medical History:  Diagnosis Date  . Atrial fibrillation ([place])   . Cancer (HCC)    Breast and thyroid cancer  . Glaucoma   . Hearing impaired    Hearing aids  . Hypertension   . Osteopenia 05/2013   T score -1.9 FRAX 15%/4.3%  . Thyroid disease    Cancer   Past Surgical History:  Procedure Laterality Date  . APPENDECTOMY    . BREAST SURGERY  1989   Lumpectomy right breast  . CARDIOVERSION N/A 02/14/2016   Procedure: CARDIOVERSION;  Surgeon: Sueanne Margarita, MD;  Location: [facility];  Service: Cardiovascular;  Laterality: N/A;  . Mole excised     Benign  . THYROID SURGERY     Removed 1 lobe  . TONSILLECTOMY       Current Outpatient Prescriptions  Medication Sig Dispense Refill  . amiodarone (PACERONE) 200 MG tablet Take 1 tablet (200 mg total) by mouth daily. 30 tablet 3  . calcium citrate-vitamin D  (CITRACAL+D) 315-200 MG-UNIT per tablet Take 1 tablet by mouth 2 (two) times daily.    Marland Kitchen CARVEDILOL PO Take by mouth.    . Cholecalciferol (VITAMIN D PO) Take 1 tablet by mouth daily.     Marland Kitchen ELIQUIS 5 MG TABS tablet Take 5 mg by mouth 2 (two) times daily.    Marland Kitchen levothyroxine (SYNTHROID, LEVOTHROID) 88 MCG tablet Take 88 mcg by mouth daily before breakfast.    . LUMIGAN 0.01 % SOLN Place 1 drop into the left eye at bedtime.     . sacubitril-valsartan (ENTRESTO) 24-26 MG Take 1 tablet by mouth 2 (two) times daily. 60 tablet 11   No current facility-administered medications for this visit.     Allergies:   Combigan [brimonidine tartrate-timolol] and Pneumococcal vaccines   Social History:  The patient  reports that she has never smoked. She has never used smokeless tobacco. She reports that she drinks alcohol. She reports that she does not use drugs.   Family History:  The patient's family history includes Breast cancer (age of onset: 43) in her daughter; Breast cancer (age of onset: 44) in her mother; Cancer in her sister; Heart attack in her brother.    ROS:  Please see the history of present illness.   Otherwise, review of systems is positive for Fatigue, chest pressure, leg pain, visual disturbance weakness.   All other systems are reviewed and positive for fatigue, cough.  PHYSICAL EXAM: VS:  BP (!) 142/96   Pulse (!) 58   Ht 5' 6" (1.676 m)   Wt 149 lb 6.4 oz (67.8 kg)   BMI 24.11 kg/m  , BMI Body mass index is 24.11 kg/m. GEN: Well nourished, well developed, in no acute distress  HEENT: normal  Neck: no JVD, carotid bruits, or masses Cardiac: RRR; no murmurs, rubs, or gallops,no edema  Respiratory:  clear to auscultation bilaterally, normal work of breathing GI: soft, nontender, nondistended, + BS MS: no deformity or atrophy  Skin: warm and dry Neuro:  Strength and sensation are intact Psych: euthymic mood, full affect  EKG:  EKG is not ordered today. Personal review of  the ekg ordered shows atrial flutter, rate 58  Recent Labs: 02/11/2016: Platelets SEE NOTE 03/09/2016: ALT 16; Hemoglobin 14.5; TSH 3.48 05/26/2016: BUN 14; Creatinine, Ser 0.84; Potassium 4.2; Sodium 143    Lipid Panel  No results found for: CHOL, TRIG, HDL, CHOLHDL, VLDL, LDLCALC, LDLDIRECT   Wt Readings from Last 3 Encounters:  08/10/16 149 lb 6.4 oz (67.8 kg)  06/15/16 150 lb (68 kg)  05/08/16 148 lb (67.1 kg)      Other studies Reviewed: Additional studies/ records that were reviewed today include: TTE 02/03/16  Review of the above records today demonstrates:  - Left ventricle: LVEF is approximately 15 to 20% with diffuse   hypokinesis. The cavity size was normal. Wall thickness was   normal. - Aortic valve: AV appears to be bicuspid. - Mitral valve: There was mild regurgitation. - Left atrium: The atrium was mildly to moderately dilated. - Right ventricle: Systolic function was mildly to moderately   reduced.  TTE 06/08/16 - Left ventricle: Wall thickness was increased in a pattern of mild   LVH. The estimated ejection fraction was 55%. Doppler parameters   are consistent with both elevated ventricular end-diastolic   filling pressure and elevated left atrial filling pressure. - Aortic valve: There was mild regurgitation. - Mitral valve: There was mild regurgitation. - Left atrium: The atrium was moderately dilated. - Atrial septum: No defect or patent foramen ovale was identified.  ASSESSMENT AND PLAN:  1.  Persistent atrial fibrillation: currently on Eliquis. Recent cardioversion 02/14/16 and was started on amiodarone. Appears to be back in Atrial flutter today. We'll plan for cardioversion as she appears to be symptomatic with fatigue and weakness..   This patients CHA2DS2-VASc Score and unadjusted Ischemic Stroke Rate (% per year) is equal to 7.2 % stroke rate/year from a score of 5  Above score calculated as 1 point each if present [CHF, HTN, DM,  Vascular=MI/PAD/Aortic Plaque, Age if 65-74, or Female] Above score calculated as 2 points each if present [Age > 75, or Stroke/TIA/TE]   2. Likely nonischemic cardiomyopathy: Repeat echo shows improvement in her ejection fraction to 55%. We'll continue carvedilol and Entresto.    Current medicines are reviewed at length with the patient today.   The patient does not have concerns regarding her medicines.  The following changes were made today: none  Labs/ tests ordered today include: cardioversion Orders Placed This Encounter  Procedures  . Comp Met (CMET)  . TSH  . CBC w/Diff  . EKG 12-Lead     Disposition:   FU with Eve Rey 3 months  Signed, Bailie Christenbury Meredith Leeds, MD  08/10/2016 11:42 AM     [facility] [address] [place] [address] [phone] (office) [phone] (fax)

## 2016-08-10 NOTE — Patient Instructions (Addendum)
Medication Instructions:    Your physician recommends that you continue on your current medications as directed. Please refer to the Current Medication list given to you today.  --- If you need a refill on your cardiac medications before your next appointment, please call your pharmacy. ---  Labwork:  Medication surveillance labs today:  CMET, TSH & CBC w/ diff  Testing/Procedures: Your physician has recommended that you have a Cardioversion (DCCV). Electrical Cardioversion uses a jolt of electricity to your heart either through paddles or wired patches attached to your chest. This is a controlled, usually prescheduled, procedure. Defibrillation is done under light anesthesia in the hospital, and you usually go home the day of the procedure. This is done to get your heart back into a normal rhythm. You are not awake for the procedure. Please see the instruction sheet given to you today.  Follow-Up:  Your physician recommends that you schedule a follow-up appointment in: 6 weeks with Dr. Curt Bears.  Thank you for choosing [facility]!!   Trinidad Curet, RN [phone]

## 2016-08-10 NOTE — Telephone Encounter (Signed)
Informed pt doctor would like her to be on low dose Carvedilol (3.125mg  BID). Rx sent to [facility] She will call office if adverse effects begin after starting medication.

## 2016-08-10 NOTE — Telephone Encounter (Signed)
New Message    Pt calling about a prescription she is suppose to be on , but you guys dont have her on it?  Pt would not give anymore information than this

## 2016-08-11 LAB — CBC WITH DIFFERENTIAL/PLATELET
Basophils Absolute: 0 10*3/uL (ref 0.0–0.2)
Basos: 1 %
EOS (ABSOLUTE): 0.1 10*3/uL (ref 0.0–0.4)
Eos: 1 %
Hematocrit: 44.8 % (ref 34.0–46.6)
Hemoglobin: 14.8 g/dL (ref 11.1–15.9)
Immature Grans (Abs): 0 10*3/uL (ref 0.0–0.1)
Immature Granulocytes: 0 %
Lymphocytes Absolute: 1.5 10*3/uL (ref 0.7–3.1)
Lymphs: 20 %
MCH: 29.4 pg (ref 26.6–33.0)
MCHC: 33 g/dL (ref 31.5–35.7)
MCV: 89 fL (ref 79–97)
Monocytes Absolute: 0.6 10*3/uL (ref 0.1–0.9)
Monocytes: 9 %
Neutrophils Absolute: 5.1 10*3/uL (ref 1.4–7.0)
Neutrophils: 69 %
RBC: 5.04 x10E6/uL (ref 3.77–5.28)
RDW: 14.5 % (ref 12.3–15.4)
WBC: 7.3 10*3/uL (ref 3.4–10.8)

## 2016-08-11 LAB — COMPREHENSIVE METABOLIC PANEL
ALT: 17 IU/L (ref 0–32)
AST: 21 IU/L (ref 0–40)
Albumin/Globulin Ratio: 1.5 (ref 1.2–2.2)
Albumin: 4.1 g/dL (ref 3.5–4.7)
Alkaline Phosphatase: 62 IU/L (ref 39–117)
BUN/Creatinine Ratio: 17 (ref 12–28)
BUN: 15 mg/dL (ref 8–27)
Bilirubin Total: 0.5 mg/dL (ref 0.0–1.2)
CO2: 27 mmol/L (ref 18–29)
Calcium: 9.2 mg/dL (ref 8.7–10.3)
Chloride: 97 mmol/L (ref 96–106)
Creatinine, Ser: 0.88 mg/dL (ref 0.57–1.00)
GFR calc Af Amer: 71 mL/min/{1.73_m2} (ref >59)
GFR calc non Af Amer: 61 mL/min/{1.73_m2} (ref >59)
Globulin, Total: 2.7 g/dL (ref 1.5–4.5)
Glucose: 92 mg/dL (ref 65–99)
Potassium: 3.8 mmol/L (ref 3.5–5.2)
Sodium: 140 mmol/L (ref 134–144)
Total Protein: 6.8 g/dL (ref 6.0–8.5)

## 2016-08-11 LAB — TSH: TSH: 2.02 u[IU]/mL (ref 0.450–4.500)

## 2016-08-13 ENCOUNTER — Encounter (HOSPITAL_COMMUNITY): Payer: Self-pay | Admitting: Certified Registered Nurse Anesthetist

## 2016-08-13 ENCOUNTER — Encounter (HOSPITAL_COMMUNITY)
Admission: RE | Disposition: A | Discharge status: Home / Self Care | Payer: Self-pay | Source: Ambulatory Visit | Attending: Cardiovascular Disease

## 2016-08-13 ENCOUNTER — Ambulatory Visit (HOSPITAL_COMMUNITY)
Admission: RE | Admit: 2016-08-13 | Discharge: 2016-08-13 | Disposition: A | Discharge status: Home / Self Care | Payer: Medicare Other | Source: Ambulatory Visit | Attending: Cardiovascular Disease | Admitting: Cardiovascular Disease

## 2016-08-13 DIAGNOSIS — Z85850 Personal history of malignant neoplasm of thyroid: Secondary | ICD-10-CM | POA: Insufficient documentation

## 2016-08-13 DIAGNOSIS — Z7901 Long term (current) use of anticoagulants: Secondary | ICD-10-CM | POA: Diagnosis not present

## 2016-08-13 DIAGNOSIS — I481 Persistent atrial fibrillation: Secondary | ICD-10-CM | POA: Diagnosis not present

## 2016-08-13 DIAGNOSIS — Z79899 Other long term (current) drug therapy: Secondary | ICD-10-CM | POA: Diagnosis not present

## 2016-08-13 DIAGNOSIS — I10 Essential (primary) hypertension: Secondary | ICD-10-CM | POA: Insufficient documentation

## 2016-08-13 DIAGNOSIS — Z538 Procedure and treatment not carried out for other reasons: Secondary | ICD-10-CM | POA: Diagnosis not present

## 2016-08-13 SURGERY — CANCELLED PROCEDURE

## 2016-08-13 MED ORDER — SODIUM CHLORIDE 0.9 % IV SOLN: INTRAVENOUS | Status: DC

## 2016-08-13 NOTE — Interval H&P Note (Signed)
History and Physical Interval Note:  08/13/2016 10:51 AM  [place]  has presented today for surgery, with the diagnosis of AFLUTTER  The various methods of treatment have been discussed with the patient and family. After consideration of risks, benefits and other options for treatment, the patient has consented to  Procedure(s): CANCELLED PROCEDURE as a surgical intervention .  The patient's history has been reviewed, patient examined, no change in status, stable for surgery.  I have reviewed the patient's chart and labs.  Questions were answered to the patient's satisfaction.     Scarlettrose Costilow

## 2016-08-13 NOTE — H&P (View-Only) (Signed)
 Electrophysiology Office Note   Date:  08/10/2016   ID:  Laura Lee, DOB 02/12/1932, MRN [id number]  PCP:  STONEKING,HAL THOMAS, MD  Primary Electrophysiologist:  Shavonn Convey Martin Simcha Farrington, MD    Chief Complaint  Patient presents with  . Follow-up    Persistent Afib     History of Present Illness: Laura Lee is a 83 y.o. female who presents today for electrophysiology evaluation.   She was found to be in new onset atrial fibrillation with rapid ventricular response. She takes Eliquis for anticoagulation. Had cardioversion 02/14/16 and was started on amiodarone.  He returns to clinic today complaining of visual disturbance and fatigue. She says that her fatigue began approximately a month ago. She has not been able to get up and do all the things he needs. She is also having some episodes of blurry vision.   Today, she denies symptoms of palpitations, chest pain, shortness of breath, orthopnea, PND, lower extremity edema, claudication, dizziness, presyncope, syncope, bleeding, or neurologic sequela. The patient is tolerating medications without difficulties.   Past Medical History:  Diagnosis Date  . Atrial fibrillation (HCC)   . Cancer (HCC)    Breast and thyroid cancer  . Glaucoma   . Hearing impaired    Hearing aids  . Hypertension   . Osteopenia 05/2013   T score -1.9 FRAX 15%/4.3%  . Thyroid disease    Cancer   Past Surgical History:  Procedure Laterality Date  . APPENDECTOMY    . BREAST SURGERY  1989   Lumpectomy right breast  . CARDIOVERSION N/A 02/14/2016   Procedure: CARDIOVERSION;  Surgeon: Traci R Turner, MD;  Location: [facility];  Service: Cardiovascular;  Laterality: N/A;  . Mole excised     Benign  . THYROID SURGERY     Removed 1 lobe  . TONSILLECTOMY       Current Outpatient Prescriptions  Medication Sig Dispense Refill  . amiodarone (PACERONE) 200 MG tablet Take 1 tablet (200 mg total) by mouth daily. 30 tablet 3  . calcium citrate-vitamin D  (CITRACAL+D) 315-200 MG-UNIT per tablet Take 1 tablet by mouth 2 (two) times daily.    . CARVEDILOL PO Take by mouth.    . Cholecalciferol (VITAMIN D PO) Take 1 tablet by mouth daily.     . ELIQUIS 5 MG TABS tablet Take 5 mg by mouth 2 (two) times daily.    . levothyroxine (SYNTHROID, LEVOTHROID) 88 MCG tablet Take 88 mcg by mouth daily before breakfast.    . LUMIGAN 0.01 % SOLN Place 1 drop into the left eye at bedtime.     . sacubitril-valsartan (ENTRESTO) 24-26 MG Take 1 tablet by mouth 2 (two) times daily. 60 tablet 11   No current facility-administered medications for this visit.     Allergies:   Combigan [brimonidine tartrate-timolol] and Pneumococcal vaccines   Social History:  The patient  reports that she has never smoked. She has never used smokeless tobacco. She reports that she drinks alcohol. She reports that she does not use drugs.   Family History:  The patient's family history includes Breast cancer (age of onset: 53) in her daughter; Breast cancer (age of onset: 60) in her mother; Cancer in her sister; Heart attack in her brother.    ROS:  Please see the history of present illness.   Otherwise, review of systems is positive for Fatigue, chest pressure, leg pain, visual disturbance weakness.   All other systems are reviewed and positive for fatigue, cough.      PHYSICAL EXAM: VS:  BP (!) 142/96   Pulse (!) 58   Ht 5' 6" (1.676 m)   Wt 149 lb 6.4 oz (67.8 kg)   BMI 24.11 kg/m  , BMI Body mass index is 24.11 kg/m. GEN: Well nourished, well developed, in no acute distress  HEENT: normal  Neck: no JVD, carotid bruits, or masses Cardiac: RRR; no murmurs, rubs, or gallops,no edema  Respiratory:  clear to auscultation bilaterally, normal work of breathing GI: soft, nontender, nondistended, + BS MS: no deformity or atrophy  Skin: warm and dry Neuro:  Strength and sensation are intact Psych: euthymic mood, full affect  EKG:  EKG is not ordered today. Personal review of  the ekg ordered shows atrial flutter, rate 58  Recent Labs: 02/11/2016: Platelets SEE NOTE 03/09/2016: ALT 16; Hemoglobin 14.5; TSH 3.48 05/26/2016: BUN 14; Creatinine, Ser 0.84; Potassium 4.2; Sodium 143    Lipid Panel  No results found for: CHOL, TRIG, HDL, CHOLHDL, VLDL, LDLCALC, LDLDIRECT   Wt Readings from Last 3 Encounters:  08/10/16 149 lb 6.4 oz (67.8 kg)  06/15/16 150 lb (68 kg)  05/08/16 148 lb (67.1 kg)      Other studies Reviewed: Additional studies/ records that were reviewed today include: TTE 02/03/16  Review of the above records today demonstrates:  - Left ventricle: LVEF is approximately 15 to 20% with diffuse   hypokinesis. The cavity size was normal. Wall thickness was   normal. - Aortic valve: AV appears to be bicuspid. - Mitral valve: There was mild regurgitation. - Left atrium: The atrium was mildly to moderately dilated. - Right ventricle: Systolic function was mildly to moderately   reduced.  TTE 06/08/16 - Left ventricle: Wall thickness was increased in a pattern of mild   LVH. The estimated ejection fraction was 55%. Doppler parameters   are consistent with both elevated ventricular end-diastolic   filling pressure and elevated left atrial filling pressure. - Aortic valve: There was mild regurgitation. - Mitral valve: There was mild regurgitation. - Left atrium: The atrium was moderately dilated. - Atrial septum: No defect or patent foramen ovale was identified.  ASSESSMENT AND PLAN:  1.  Persistent atrial fibrillation: currently on Eliquis. Recent cardioversion 02/14/16 and was started on amiodarone. Appears to be back in Atrial flutter today. We'll plan for cardioversion as she appears to be symptomatic with fatigue and weakness..   This patients CHA2DS2-VASc Score and unadjusted Ischemic Stroke Rate (% per year) is equal to 7.2 % stroke rate/year from a score of 5  Above score calculated as 1 point each if present [CHF, HTN, DM,  Vascular=MI/PAD/Aortic Plaque, Age if 65-74, or Female] Above score calculated as 2 points each if present [Age > 75, or Stroke/TIA/TE]   2. Likely nonischemic cardiomyopathy: Repeat echo shows improvement in her ejection fraction to 55%. We'll continue carvedilol and Entresto.    Current medicines are reviewed at length with the patient today.   The patient does not have concerns regarding her medicines.  The following changes were made today: none  Labs/ tests ordered today include: cardioversion Orders Placed This Encounter  Procedures  . Comp Met (CMET)  . TSH  . CBC w/Diff  . EKG 12-Lead     Disposition:   FU with Rama Sorci 3 months  Signed, Agapito Hanway Meredith Leeds, MD  08/10/2016 11:42 AM     [facility] [address] [place] [address] [phone] (office) [phone] (fax)

## 2016-08-13 NOTE — Progress Notes (Signed)
Pt self converted, no longer needs to be cardioverted/brt,rn

## 2016-09-18 ENCOUNTER — Other Ambulatory Visit: Payer: Self-pay | Admitting: Geriatric Medicine

## 2016-09-18 DIAGNOSIS — Z1231 Encounter for screening mammogram for malignant neoplasm of breast: Secondary | ICD-10-CM

## 2016-09-21 ENCOUNTER — Ambulatory Visit (INDEPENDENT_AMBULATORY_CARE_PROVIDER_SITE_OTHER): Payer: Medicare Other | Admitting: Cardiology

## 2016-09-21 ENCOUNTER — Encounter: Payer: Self-pay | Admitting: Cardiology

## 2016-09-21 VITALS — BP 164/90 | HR 64 | Ht 66.0 in | Wt 151.4 lb

## 2016-09-21 DIAGNOSIS — I481 Persistent atrial fibrillation: Secondary | ICD-10-CM

## 2016-09-21 DIAGNOSIS — I4819 Other persistent atrial fibrillation: Secondary | ICD-10-CM

## 2016-09-21 NOTE — Progress Notes (Signed)
Electrophysiology Office Note   Date:  09/21/2016   ID:  Laura Lee, DOB [birth year], MRN [id number]  PCP:  Mathews Argyle, MD  Primary Electrophysiologist:  Mauriah Mcmillen Meredith Leeds, MD    No chief complaint on file.    History of Present Illness: Laura Lee is a 83 y.o. female who presents today for electrophysiology evaluation.   She was found to be in new onset atrial fibrillation with rapid ventricular response. She takes Eliquis for anticoagulation. Had cardioversion 02/14/16 and was started on amiodarone.  She continues to complain of fatigue. She feels like her symptoms are mainly related to her medications. She was feeling well prior to her diagnosis of atrial fibrillation and the start of all of her heart medications.   Today, she denies symptoms of palpitations, chest pain, shortness of breath, orthopnea, PND, lower extremity edema, claudication, dizziness, presyncope, syncope, bleeding, or neurologic sequela. The patient is tolerating medications without difficulties.   Past Medical History:  Diagnosis Date  . Atrial fibrillation ([place])   . Cancer (HCC)    Breast and thyroid cancer  . Glaucoma   . Hearing impaired    Hearing aids  . Hypertension   . Osteopenia 05/2013   T score -1.9 FRAX 15%/4.3%  . Thyroid disease    Cancer   Past Surgical History:  Procedure Laterality Date  . APPENDECTOMY    . BREAST SURGERY  1989   Lumpectomy right breast  . CARDIOVERSION N/A 02/14/2016   Procedure: CARDIOVERSION;  Surgeon: Sueanne Margarita, MD;  Location: [facility];  Service: Cardiovascular;  Laterality: N/A;  . Mole excised     Benign  . THYROID SURGERY     Removed 1 lobe  . TONSILLECTOMY       Current Outpatient Prescriptions  Medication Sig Dispense Refill  . amiodarone (PACERONE) 200 MG tablet Take 1 tablet (200 mg total) by mouth daily. 30 tablet 3  . calcium citrate-vitamin D (CITRACAL+D) 315-200 MG-UNIT per tablet Take 1 tablet by mouth 2 (two) times  daily.    . carvedilol (COREG) 3.125 MG tablet Take 1 tablet (3.125 mg total) by mouth 2 (two) times daily. 60 tablet 3  . Cholecalciferol (VITAMIN D PO) Take 1 tablet by mouth daily.     Marland Kitchen ELIQUIS 5 MG TABS tablet Take 5 mg by mouth 2 (two) times daily.    Marland Kitchen levothyroxine (SYNTHROID, LEVOTHROID) 88 MCG tablet Take 88 mcg by mouth daily before breakfast.    . LUMIGAN 0.01 % SOLN Place 1 drop into the left eye at bedtime.     . sacubitril-valsartan (ENTRESTO) 24-26 MG Take 1 tablet by mouth 2 (two) times daily. 60 tablet 11   No current facility-administered medications for this visit.     Allergies:   Combigan [brimonidine tartrate-timolol] and Pneumococcal vaccines   Social History:  The patient  reports that she has never smoked. She has never used smokeless tobacco. She reports that she drinks alcohol. She reports that she does not use drugs.   Family History:  The patient's family history includes Breast cancer (age of onset: 11) in her daughter; Breast cancer (age of onset: 73) in her mother; Cancer in her sister; Heart attack in her brother.    ROS:  Please see the history of present illness.   Otherwise, review of systems is positive for chest pressure.   All other systems are reviewed and negative.   PHYSICAL EXAM: VS:  There were no vitals taken for this visit. ,  BMI There is no height or weight on file to calculate BMI. GEN: Well nourished, well developed, in no acute distress  HEENT: normal  Neck: no JVD, carotid bruits, or masses Cardiac: RRR; no murmurs, rubs, or gallops,no edema  Respiratory:  clear to auscultation bilaterally, normal work of breathing GI: soft, nontender, nondistended, + BS MS: no deformity or atrophy  Skin: warm and dry Neuro:  Strength and sensation are intact Psych: euthymic mood, full affect   EKG:  EKG is ordered today. Personal review of the ekg ordered shows Sinus rhythm, left anterior fascicular block, LVH with 3 portal  abnormalities   Recent Labs: 03/09/2016: Hemoglobin 14.5 08/10/2016: ALT 17; BUN 15; Creatinine, Ser 0.88; Platelets CANCELED; Potassium 3.8; Sodium 140; TSH 2.020    Lipid Panel  No results found for: CHOL, TRIG, HDL, CHOLHDL, VLDL, LDLCALC, LDLDIRECT   Wt Readings from Last 3 Encounters:  08/10/16 149 lb 6.4 oz (67.8 kg)  06/15/16 150 lb (68 kg)  05/08/16 148 lb (67.1 kg)      Other studies Reviewed: Additional studies/ records that were reviewed today include: TTE 02/03/16  Review of the above records today demonstrates:  - Left ventricle: LVEF is approximately 15 to 20% with diffuse   hypokinesis. The cavity size was normal. Wall thickness was   normal. - Aortic valve: AV appears to be bicuspid. - Mitral valve: There was mild regurgitation. - Left atrium: The atrium was mildly to moderately dilated. - Right ventricle: Systolic function was mildly to moderately   reduced.  TTE 06/08/16 - Left ventricle: Wall thickness was increased in a pattern of mild   LVH. The estimated ejection fraction was 55%. Doppler parameters   are consistent with both elevated ventricular end-diastolic   filling pressure and elevated left atrial filling pressure. - Aortic valve: There was mild regurgitation. - Mitral valve: There was mild regurgitation. - Left atrium: The atrium was moderately dilated. - Atrial septum: No defect or patent foramen ovale was identified.  ASSESSMENT AND PLAN:  1.  Persistent atrial fibrillation: Currently on Eliquis. She is in sinus rhythm today. She would like to try coming off of her amiodarone as an she is in normal rhythm. She may require increase in her beta blockers if she does go into atrial fibrillation for rate control. We Maverik Foot potentially discuss further antiarrhythmics in the future as her amiodarone wears off.  This patients CHA2DS2-VASc Score and unadjusted Ischemic Stroke Rate (% per year) is equal to 7.2 % stroke rate/year from a score of 5  Above  score calculated as 1 point each if present [CHF, HTN, DM, Vascular=MI/PAD/Aortic Plaque, Age if 65-74, or Female] Above score calculated as 2 points each if present [Age > 75, or Stroke/TIA/TE]   2. Likely nonischemic cardiomyopathy: Echo has shown improvement in her ejection fraction. Continue Entresto and carvedilol.    Current medicines are reviewed at length with the patient today.   The patient does not have concerns regarding her medicines.  The following changes were made today: stop amiodarone  Labs/ tests ordered today include: cardioversion No orders of the defined types were placed in this encounter.    Disposition:   FU with Blayke Cordrey 3 months  Signed, Truman Aceituno Meredith Leeds, MD  09/21/2016 2:26 PM     [address] [place] [address] [phone] (office) [phone] (fax)

## 2016-09-21 NOTE — Patient Instructions (Signed)
Medication Instructions:   Your physician has recommended you make the following change in your medication:  1) STOP Amiodarone  Labwork:  None ordered  Testing/Procedures:  None ordered  Follow-Up:  Your physician recommends that you schedule a follow-up appointment in: 3 months with Dr. Curt Bears.  - If you need a refill on your cardiac medications before your next appointment, please call your pharmacy.   Thank you for choosing [facility]!!   Trinidad Curet, RN [phone]

## 2016-10-28 DIAGNOSIS — M170 Bilateral primary osteoarthritis of knee: Secondary | ICD-10-CM | POA: Diagnosis not present

## 2016-11-03 ENCOUNTER — Ambulatory Visit
Admission: RE | Admit: 2016-11-03 | Discharge: 2016-11-03 | Disposition: A | Discharge status: Home / Self Care | Payer: Medicare Other | Source: Ambulatory Visit | Attending: Geriatric Medicine | Admitting: Geriatric Medicine

## 2016-11-03 DIAGNOSIS — Z1231 Encounter for screening mammogram for malignant neoplasm of breast: Secondary | ICD-10-CM | POA: Diagnosis not present

## 2016-11-20 DIAGNOSIS — H401223 Low-tension glaucoma, left eye, severe stage: Secondary | ICD-10-CM | POA: Diagnosis not present

## 2016-11-20 DIAGNOSIS — H401211 Low-tension glaucoma, right eye, mild stage: Secondary | ICD-10-CM | POA: Diagnosis not present

## 2016-11-20 DIAGNOSIS — H25813 Combined forms of age-related cataract, bilateral: Secondary | ICD-10-CM | POA: Diagnosis not present

## 2016-11-20 DIAGNOSIS — H179 Unspecified corneal scar and opacity: Secondary | ICD-10-CM | POA: Diagnosis not present

## 2016-11-30 ENCOUNTER — Other Ambulatory Visit: Payer: Self-pay | Admitting: Cardiology

## 2016-12-07 ENCOUNTER — Encounter: Payer: Self-pay | Admitting: *Deleted

## 2016-12-22 ENCOUNTER — Ambulatory Visit (INDEPENDENT_AMBULATORY_CARE_PROVIDER_SITE_OTHER): Payer: Medicare Other | Admitting: Cardiology

## 2016-12-22 ENCOUNTER — Encounter: Payer: Self-pay | Admitting: Cardiology

## 2016-12-22 VITALS — BP 146/90 | HR 68 | Ht 66.0 in | Wt 147.0 lb

## 2016-12-22 DIAGNOSIS — I481 Persistent atrial fibrillation: Secondary | ICD-10-CM

## 2016-12-22 DIAGNOSIS — I428 Other cardiomyopathies: Secondary | ICD-10-CM

## 2016-12-22 DIAGNOSIS — I4819 Other persistent atrial fibrillation: Secondary | ICD-10-CM

## 2016-12-22 MED ORDER — METOPROLOL SUCCINATE ER 25 MG PO TB24: 25.0000 mg | ORAL_TABLET | Freq: Every day | ORAL | 1 refills | Status: DC

## 2016-12-22 MED ORDER — SACUBITRIL-VALSARTAN 24-26 MG PO TABS: 1.0000 | ORAL_TABLET | Freq: Two times a day (BID) | ORAL | 6 refills | Status: DC

## 2016-12-22 NOTE — Progress Notes (Signed)
Electrophysiology Office Note   Date:  12/22/2016   ID:  Laura Lee, DOB Dec 30, 1931, MRN [id number]  PCP:  [name], MD  Primary Electrophysiologist:  [name], MD    Chief Complaint  Patient presents with  . Follow-up    Persistent Afib  . Edema    both ankles  . Fatigue     History of Present Illness: Laura Lee is a 83 y.o. female who presents today for electrophysiology evaluation.   She was found to be in new onset atrial fibrillation with rapid ventricular response. She takes Eliquis for anticoagulation. Had cardioversion 02/14/16 and was started on amiodarone.  Amiodarone is since been stopped. She says that she has had more fatigue since starting her cardiac medications. She stopped her [name] a month ago on her own. She is able to do most of her daily activities, recently went on a trip and walk up to 10,000 steps a day.   Today, denies symptoms of palpitations, chest pain, shortness of breath, orthopnea, PND, lower extremity edema, claudication, dizziness, presyncope, syncope, bleeding, or neurologic sequela. The patient is tolerating medications without difficulties and is otherwise without complaint today.    Past Medical History:  Diagnosis Date  . Atrial fibrillation ([place])   . Cancer (HCC)    Breast and thyroid cancer  . Glaucoma   . Hearing impaired    Hearing aids  . Hypertension   . Osteopenia 05/2013   T score -1.9 FRAX 15%/4.3%  . Thyroid disease    Cancer   Past Surgical History:  Procedure Laterality Date  . APPENDECTOMY    . BREAST LUMPECTOMY     [id number] radiation  . BREAST SURGERY  1989   Lumpectomy right breast  . CARDIOVERSION N/A 02/14/2016   Procedure: CARDIOVERSION;  Surgeon: Sueanne Margarita, MD;  Location: [facility];  Service: Cardiovascular;  Laterality: N/A;  . Mole excised     Benign  . THYROID SURGERY     Removed 1 lobe  . TONSILLECTOMY       Current Outpatient Prescriptions  Medication Sig  Dispense Refill  . calcium citrate-vitamin D (CITRACAL+D) 315-200 MG-UNIT per tablet Take 1 tablet by mouth 2 (two) times daily.    . carvedilol (COREG) 3.125 MG tablet TAKE 1 TABLET BY MOUTH TWICE DAILY. 180 tablet 2  . Cholecalciferol (VITAMIN D PO) Take 1 tablet by mouth daily.     Marland Kitchen ELIQUIS 5 MG TABS tablet Take 5 mg by mouth 2 (two) times daily.    Marland Kitchen levothyroxine (SYNTHROID, LEVOTHROID) 88 MCG tablet Take 88 mcg by mouth daily before breakfast.    . LUMIGAN 0.01 % SOLN Place 1 drop into the left eye at bedtime.      No current facility-administered medications for this visit.     Allergies:   Combigan [brimonidine tartrate-timolol] and Pneumococcal vaccines   Social History:  The patient  reports that she has never smoked. She has never used smokeless tobacco. She reports that she drinks alcohol. She reports that she does not use drugs.   Family History:  The patient's family history includes Breast cancer (age of onset: 67) in her daughter; Breast cancer (age of onset: 15) in her mother; Cancer in her sister; Heart attack in her brother.    ROS:  Please see the history of present illness.   Otherwise, review of systems is positive for fatigue, leg pain.   All other systems are reviewed and negative.   PHYSICAL EXAM:  VS:  BP (!) 146/90   Pulse 68   Ht 5\' 6"  (1.676 m)   Wt 147 lb (66.7 kg)   BMI 23.73 kg/m  , BMI Body mass index is 23.73 kg/m. GEN: Well nourished, well developed, in no acute distress  HEENT: normal  Neck: no JVD, carotid bruits, or masses Cardiac: RRR; no murmurs, rubs, or gallops,no edema  Respiratory:  clear to auscultation bilaterally, normal work of breathing GI: soft, nontender, nondistended, + BS MS: no deformity or atrophy  Skin: warm and dry Neuro:  Strength and sensation are intact Psych: euthymic mood, full affect  EKG:  EKG is ordered today. Personal review of the ekg ordered shows sinus rhythm, LAFB, LVH by voltage, rate 68   Recent  Labs: 08/10/2016: ALT 17; BUN 15; Creatinine, Ser 0.88; Hemoglobin 14.8; Platelets CANCELED; Potassium 3.8; Sodium 140; TSH 2.020    Lipid Panel  No results found for: CHOL, TRIG, HDL, CHOLHDL, VLDL, LDLCALC, LDLDIRECT   Wt Readings from Last 3 Encounters:  12/22/16 147 lb (66.7 kg)  09/21/16 151 lb 6.4 oz (68.7 kg)  08/10/16 149 lb 6.4 oz (67.8 kg)      Other studies Reviewed: Additional studies/ records that were reviewed today include: TTE 02/03/16  Review of the above records today demonstrates:  - Left ventricle: LVEF is approximately 15 to 20% with diffuse   hypokinesis. The cavity size was normal. Wall thickness was   normal. - Aortic valve: AV appears to be bicuspid. - Mitral valve: There was mild regurgitation. - Left atrium: The atrium was mildly to moderately dilated. - Right ventricle: Systolic function was mildly to moderately   reduced.  TTE 06/08/16 - Left ventricle: Wall thickness was increased in a pattern of mild   LVH. The estimated ejection fraction was 55%. Doppler parameters   are consistent with both elevated ventricular end-diastolic   filling pressure and elevated left atrial filling pressure. - Aortic valve: There was mild regurgitation. - Mitral valve: There was mild regurgitation. - Left atrium: The atrium was moderately dilated. - Atrial septum: No defect or patent foramen ovale was identified.  ASSESSMENT AND PLAN:  1.  Persistent atrial fibrillation: Currently on Eliquis. Stop amiodarone at the last visit. Remains in sinus rhythm today. If she returns to atrial fibrillation, we'll likely need further rhythm control.  This patients CHA2DS2-VASc Score and unadjusted Ischemic Stroke Rate (% per year) is equal to 7.2 % stroke rate/year from a score of 5  Above score calculated as 1 point each if present [CHF, HTN, DM, Vascular=MI/PAD/Aortic Plaque, Age if 65-74, or Female] Above score calculated as 2 points each if present [Age > 75, or  Stroke/TIA/TE]   2. Likely nonischemic cardiomyopathy: Echo shows improvement of LV function. On Entresto and carvedilol. We'll plan to stop carvedilol and start Toprol-XL, as this may help with some of her fatigue issues.    Current medicines are reviewed at length with the patient today.   The patient does not have concerns regarding her medicines.  The following changes were made today:   Labs/ tests ordered today include: cardioversion No orders of the defined types were placed in this encounter.    Disposition:   FU with Dominyk Law 3 months  Signed, Janzen Sacks Meredith Leeds, MD  12/22/2016 2:40 PM     [address] [place] [address] [phone] (office) [phone] (fax)

## 2016-12-22 NOTE — Patient Instructions (Addendum)
Medication Instructions:  Your physician has recommended you make the following change in your medication:  1. STOP Carvedilol 2. START Toprol 25 mg once daily 3. RESTART Entresto 24/26 mg twice daily  If you need a refill on your cardiac medications before your next appointment, please call your pharmacy.   Labwork: None ordered  Testing/Procedures: None ordered  Follow-Up: Your physician recommends that you schedule a follow-up appointment in: 3 months with Dr. Curt Bears.  Thank you for choosing [facility]!!   Trinidad Curet, RN [phone]  Any Other Special Instructions Will Be Listed Below (If Applicable).

## 2017-01-22 DIAGNOSIS — E038 Other specified hypothyroidism: Secondary | ICD-10-CM | POA: Diagnosis not present

## 2017-01-22 DIAGNOSIS — I5020 Unspecified systolic (congestive) heart failure: Secondary | ICD-10-CM | POA: Diagnosis not present

## 2017-01-22 DIAGNOSIS — I480 Paroxysmal atrial fibrillation: Secondary | ICD-10-CM | POA: Diagnosis not present

## 2017-01-22 DIAGNOSIS — D696 Thrombocytopenia, unspecified: Secondary | ICD-10-CM | POA: Diagnosis not present

## 2017-01-22 DIAGNOSIS — I10 Essential (primary) hypertension: Secondary | ICD-10-CM | POA: Diagnosis not present

## 2017-01-22 DIAGNOSIS — Z79899 Other long term (current) drug therapy: Secondary | ICD-10-CM | POA: Diagnosis not present

## 2017-01-22 DIAGNOSIS — Z1389 Encounter for screening for other disorder: Secondary | ICD-10-CM | POA: Diagnosis not present

## 2017-01-22 DIAGNOSIS — Z0000 Encounter for general adult medical examination without abnormal findings: Secondary | ICD-10-CM | POA: Diagnosis not present

## 2017-01-22 DIAGNOSIS — Z7189 Other specified counseling: Secondary | ICD-10-CM | POA: Diagnosis not present

## 2017-01-22 DIAGNOSIS — E039 Hypothyroidism, unspecified: Secondary | ICD-10-CM | POA: Diagnosis not present

## 2017-02-01 DIAGNOSIS — M170 Bilateral primary osteoarthritis of knee: Secondary | ICD-10-CM | POA: Diagnosis not present

## 2017-02-18 DIAGNOSIS — H35359 Cystoid macular degeneration, unspecified eye: Secondary | ICD-10-CM | POA: Diagnosis not present

## 2017-02-19 DIAGNOSIS — H25813 Combined forms of age-related cataract, bilateral: Secondary | ICD-10-CM | POA: Diagnosis not present

## 2017-02-19 DIAGNOSIS — H401223 Low-tension glaucoma, left eye, severe stage: Secondary | ICD-10-CM | POA: Diagnosis not present

## 2017-03-19 DIAGNOSIS — Z23 Encounter for immunization: Secondary | ICD-10-CM | POA: Diagnosis not present

## 2017-03-25 ENCOUNTER — Encounter: Payer: Self-pay | Admitting: Cardiology

## 2017-03-25 ENCOUNTER — Ambulatory Visit (INDEPENDENT_AMBULATORY_CARE_PROVIDER_SITE_OTHER): Payer: Medicare Other | Admitting: Cardiology

## 2017-03-25 VITALS — BP 166/70 | HR 78 | Ht 66.0 in | Wt 143.0 lb

## 2017-03-25 DIAGNOSIS — I428 Other cardiomyopathies: Secondary | ICD-10-CM | POA: Diagnosis not present

## 2017-03-25 DIAGNOSIS — I4819 Other persistent atrial fibrillation: Secondary | ICD-10-CM

## 2017-03-25 DIAGNOSIS — I481 Persistent atrial fibrillation: Secondary | ICD-10-CM

## 2017-03-25 MED ORDER — DILTIAZEM HCL ER COATED BEADS 120 MG PO CP24: 120.0000 mg | ORAL_CAPSULE | Freq: Every day | ORAL | 6 refills | Status: DC

## 2017-03-25 NOTE — Patient Instructions (Addendum)
Medication Instructions:  Your physician has recommended you make the following change in your medication: 1. STOP METOPROLOL 2. START DILTIAZEM 120 mg once daily  -- If you need a refill on your cardiac medications before your next appointment, please call your pharmacy. --  Labwork: None ordered  Testing/Procedures: None ordered  Follow-Up: Your physician wants you to follow-up in: 6 months with Dr. Curt Bears.  You will receive a reminder letter in the mail two months in advance. If you don't receive a letter, please call our office to schedule the follow-up appointment.  Thank you for choosing [facility]!!   Trinidad Curet, RN [phone]  Any Other Special Instructions Will Be Listed Below (If Applicable).  Please call the office if you remain fatigued after several weeks

## 2017-03-25 NOTE — Progress Notes (Signed)
Electrophysiology Office Note   Date:  03/25/2017   ID:  Laura Lee, DOB [birth year], MRN [id number]  PCP:  [name], MD  Primary Electrophysiologist:  [name], MD    Chief Complaint  Patient presents with  . Follow-up    Persistent Afib/Nonischemic cardiomyopathy     History of Present Illness: Laura Lee is a 83 y.o. female who presents today for electrophysiology evaluation.   She was found to be in new onset atrial fibrillation with rapid ventricular response. She takes Eliquis for anticoagulation. Had cardioversion 02/14/16 and was started on amiodarone.  Amiodarone has since been stopped for fatigue.  She says that this did make her feel better, but she has continued to have significant amounts of fatigue throughout the day.  She does not have shortness of breath.  She has not had any chest pain.  Today, denies symptoms of palpitations, chest pain, shortness of breath, orthopnea, PND, lower extremity edema, claudication, dizziness, presyncope, syncope, bleeding, or neurologic sequela. The patient is tolerating medications without difficulties.     Past Medical History:  Diagnosis Date  . Atrial fibrillation ([place])   . Cancer (HCC)    Breast and thyroid cancer  . Glaucoma   . Hearing impaired    Hearing aids  . Hypertension   . Osteopenia 05/2013   T score -1.9 FRAX 15%/4.3%  . Thyroid disease    Cancer   Past Surgical History:  Procedure Laterality Date  . APPENDECTOMY    . BREAST LUMPECTOMY     [id number] radiation  . BREAST SURGERY  1989   Lumpectomy right breast  . CARDIOVERSION N/A 02/14/2016   Procedure: CARDIOVERSION;  Surgeon: Sueanne Margarita, MD;  Location: [facility];  Service: Cardiovascular;  Laterality: N/A;  . Mole excised     Benign  . THYROID SURGERY     Removed 1 lobe  . TONSILLECTOMY       Current Outpatient Prescriptions  Medication Sig Dispense Refill  . calcium citrate-vitamin D (CITRACAL+D) 315-200 MG-UNIT per  tablet Take 1 tablet by mouth 2 (two) times daily.    . Cholecalciferol (VITAMIN D PO) Take 1 tablet by mouth daily.     Marland Kitchen ELIQUIS 5 MG TABS tablet Take 5 mg by mouth 2 (two) times daily.    Marland Kitchen levothyroxine (SYNTHROID, LEVOTHROID) 88 MCG tablet Take 88 mcg by mouth daily before breakfast.    . LUMIGAN 0.01 % SOLN Place 1 drop into the left eye at bedtime.     . meloxicam (MOBIC) 15 MG tablet Take 15 mg by mouth as needed for pain.    . metoprolol succinate (TOPROL XL) 25 MG 24 hr tablet Take 1 tablet (25 mg total) by mouth daily. 90 tablet 1  . sacubitril-valsartan (ENTRESTO) 24-26 MG Take 1 tablet by mouth 2 (two) times daily. 60 tablet 6   No current facility-administered medications for this visit.     Allergies:   Combigan [brimonidine tartrate-timolol] and Pneumococcal vaccines   Social History:  The patient  reports that she has never smoked. She has never used smokeless tobacco. She reports that she drinks alcohol. She reports that she does not use drugs.   Family History:  The patient's family history includes Breast cancer (age of onset: 53) in her daughter; Breast cancer (age of onset: 63) in her mother; Cancer in her sister; Heart attack in her brother.    ROS:  Please see the history of present illness.   Otherwise, review  of systems is positive for fatigue.   All other systems are reviewed and negative.   PHYSICAL EXAM: VS:  BP (!) 166/70   Pulse 78   Ht 5\' 6"  (1.676 m)   Wt 143 lb (64.9 kg)   SpO2 97%   BMI 23.08 kg/m  , BMI Body mass index is 23.08 kg/m. GEN: Well nourished, well developed, in no acute distress  HEENT: normal  Neck: no JVD, carotid bruits, or masses Cardiac: RRR; no murmurs, rubs, or gallops,no edema  Respiratory:  clear to auscultation bilaterally, normal work of breathing GI: soft, nontender, nondistended, + BS MS: no deformity or atrophy  Skin: warm and dry Neuro:  Strength and sensation are intact Psych: euthymic mood, full affect  EKG:   EKG is not ordered today. Personal review of the ekg ordered 12/22/16 shows sinus rhythm, LAFB, LVH by voltage   Recent Labs: 08/10/2016: ALT 17; BUN 15; Creatinine, Ser 0.88; Hemoglobin 14.8; Platelets CANCELED; Potassium 3.8; Sodium 140; TSH 2.020    Lipid Panel  No results found for: CHOL, TRIG, HDL, CHOLHDL, VLDL, LDLCALC, LDLDIRECT   Wt Readings from Last 3 Encounters:  03/25/17 143 lb (64.9 kg)  12/22/16 147 lb (66.7 kg)  09/21/16 151 lb 6.4 oz (68.7 kg)      Other studies Reviewed: Additional studies/ records that were reviewed today include: TTE 06/08/16  Review of the above records today demonstrates:  - Left ventricle: Wall thickness was increased in a pattern of mild   LVH. The estimated ejection fraction was 55%. Doppler parameters   are consistent with both elevated ventricular end-diastolic   filling pressure and elevated left atrial filling pressure. - Aortic valve: There was mild regurgitation. - Mitral valve: There was mild regurgitation. - Left atrium: The atrium was moderately dilated. - Atrial septum: No defect or patent foramen ovale was identified.  ASSESSMENT AND PLAN:  1.  Persistent atrial fibrillation: Currently on Eliquis.  Is not on any antiarrhythmics.  Should she require antiarrhythmics, she would most likely benefit from T consent loading.  We Pasha Broad continue with current management.  This patients CHA2DS2-VASc Score and unadjusted Ischemic Stroke Rate (% per year) is equal to 7.2 % stroke rate/year from a score of 5  Above score calculated as 1 point each if present [CHF, HTN, DM, Vascular=MI/PAD/Aortic Plaque, Age if 65-74, or Female] Above score calculated as 2 points each if present [Age > 75, or Stroke/TIA/TE]  2. Likely nonischemic cardiomyopathy: She is fortunately had improvement of her LV systolic function.  She is on Entresto and Toprol-XL.  Due to her fatigue, [name] stop Toprol-XL and start her on 120 mg of diltiazem.    Current  medicines are reviewed at length with the patient today.   The patient does not have concerns regarding her medicines.  The following changes were made today: Stop Toprol-XL, start diltiazem  Labs/ tests ordered today include: cardioversion No orders of the defined types were placed in this encounter.    Disposition:   FU with Monque Haggar 6 months  Signed, Advait Buice Meredith Leeds, MD  03/25/2017 3:47 PM     [address] [place] [address] [phone] (office) [phone] (fax)

## 2017-04-02 DIAGNOSIS — H401223 Low-tension glaucoma, left eye, severe stage: Secondary | ICD-10-CM | POA: Diagnosis not present

## 2017-04-02 DIAGNOSIS — H25813 Combined forms of age-related cataract, bilateral: Secondary | ICD-10-CM | POA: Diagnosis not present

## 2017-06-04 DIAGNOSIS — E039 Hypothyroidism, unspecified: Secondary | ICD-10-CM | POA: Diagnosis not present

## 2017-06-04 DIAGNOSIS — I5020 Unspecified systolic (congestive) heart failure: Secondary | ICD-10-CM | POA: Diagnosis not present

## 2017-06-04 DIAGNOSIS — I480 Paroxysmal atrial fibrillation: Secondary | ICD-10-CM | POA: Diagnosis not present

## 2017-06-04 DIAGNOSIS — H409 Unspecified glaucoma: Secondary | ICD-10-CM | POA: Diagnosis not present

## 2017-06-04 DIAGNOSIS — M170 Bilateral primary osteoarthritis of knee: Secondary | ICD-10-CM | POA: Diagnosis not present

## 2017-06-04 DIAGNOSIS — I10 Essential (primary) hypertension: Secondary | ICD-10-CM | POA: Diagnosis not present

## 2017-07-19 DIAGNOSIS — Z79899 Other long term (current) drug therapy: Secondary | ICD-10-CM | POA: Diagnosis not present

## 2017-07-19 DIAGNOSIS — E039 Hypothyroidism, unspecified: Secondary | ICD-10-CM | POA: Diagnosis not present

## 2017-07-19 DIAGNOSIS — I480 Paroxysmal atrial fibrillation: Secondary | ICD-10-CM | POA: Diagnosis not present

## 2017-07-19 DIAGNOSIS — I10 Essential (primary) hypertension: Secondary | ICD-10-CM | POA: Diagnosis not present

## 2017-07-19 DIAGNOSIS — I5020 Unspecified systolic (congestive) heart failure: Secondary | ICD-10-CM | POA: Diagnosis not present

## 2017-07-19 DIAGNOSIS — D696 Thrombocytopenia, unspecified: Secondary | ICD-10-CM | POA: Diagnosis not present

## 2017-07-24 ENCOUNTER — Other Ambulatory Visit: Payer: Self-pay | Admitting: Cardiology

## 2017-07-30 DIAGNOSIS — H401223 Low-tension glaucoma, left eye, severe stage: Secondary | ICD-10-CM | POA: Diagnosis not present

## 2017-07-30 DIAGNOSIS — H401211 Low-tension glaucoma, right eye, mild stage: Secondary | ICD-10-CM | POA: Diagnosis not present

## 2017-07-30 DIAGNOSIS — H2513 Age-related nuclear cataract, bilateral: Secondary | ICD-10-CM | POA: Diagnosis not present

## 2017-07-30 DIAGNOSIS — H353130 Nonexudative age-related macular degeneration, bilateral, stage unspecified: Secondary | ICD-10-CM | POA: Diagnosis not present

## 2017-08-17 ENCOUNTER — Encounter: Payer: Self-pay | Admitting: Cardiology

## 2017-08-17 ENCOUNTER — Ambulatory Visit (INDEPENDENT_AMBULATORY_CARE_PROVIDER_SITE_OTHER): Payer: Medicare Other | Admitting: Cardiology

## 2017-08-17 VITALS — BP 136/86 | HR 62 | Ht 66.0 in | Wt 140.0 lb

## 2017-08-17 DIAGNOSIS — I428 Other cardiomyopathies: Secondary | ICD-10-CM | POA: Diagnosis not present

## 2017-08-17 DIAGNOSIS — I481 Persistent atrial fibrillation: Secondary | ICD-10-CM

## 2017-08-17 DIAGNOSIS — I4819 Other persistent atrial fibrillation: Secondary | ICD-10-CM

## 2017-08-17 NOTE — Progress Notes (Signed)
Electrophysiology Office Note   Date:  08/17/2017   ID:  ANAIZ QAZI, DOB 1932/01/26, MRN [id number]  PCP:  [name], MD  Primary Electrophysiologist:  [name], MD    Chief Complaint  Patient presents with  . Follow-up    Persistent Afib/Nonischemic cardiomyopathy     History of Present Illness: Laura Lee is a 84 y.o. female who presents today for electrophysiology evaluation.   She was found to be in new onset atrial fibrillation with rapid ventricular response. She takes Eliquis for anticoagulation. Had cardioversion 02/14/16 and was started on amiodarone.  Amiodarone has since been stopped for fatigue.  She says that this did make her feel better, but she has continued to have significant amounts of fatigue throughout the day.  She does not have shortness of breath.  She has not had any chest pain.  Today, denies symptoms of palpitations, chest pain, shortness of breath, orthopnea, PND, lower extremity edema, claudication, dizziness, presyncope, syncope, bleeding, or neurologic sequela. The patient is tolerating medications without difficulties.  She does continue to have fatigue.  She did not stop her metoprolol at the last visit when instructed.  She is planning to start diltiazem after this visit and stop metoprolol.   Past Medical History:  Diagnosis Date  . Atrial fibrillation ([place])   . Cancer (HCC)    Breast and thyroid cancer  . Glaucoma   . Hearing impaired    Hearing aids  . Hypertension   . Osteopenia 05/2013   T score -1.9 FRAX 15%/4.3%  . Thyroid disease    Cancer   Past Surgical History:  Procedure Laterality Date  . APPENDECTOMY    . BREAST LUMPECTOMY     [id number] radiation  . BREAST SURGERY  1989   Lumpectomy right breast  . CARDIOVERSION N/A 02/14/2016   Procedure: CARDIOVERSION;  Surgeon: Sueanne Margarita, MD;  Location: [facility];  Service: Cardiovascular;  Laterality: N/A;  . Mole excised     Benign  . THYROID SURGERY     Removed 1 lobe  . TONSILLECTOMY       Current Outpatient Medications  Medication Sig Dispense Refill  . calcium citrate-vitamin D (CITRACAL+D) 315-200 MG-UNIT per tablet Take 1 tablet by mouth 2 (two) times daily.    . Cholecalciferol (VITAMIN D PO) Take 1 tablet by mouth daily.     Marland Kitchen ELIQUIS 5 MG TABS tablet Take 5 mg by mouth 2 (two) times daily.    Marland Kitchen ENTRESTO 24-26 MG TAKE 1 TABLET BY MOUTH TWICE DAILY 180 tablet 2  . levothyroxine (SYNTHROID, LEVOTHROID) 88 MCG tablet Take 88 mcg by mouth daily before breakfast.    . LUMIGAN 0.01 % SOLN Place 1 drop into the left eye at bedtime.     . meloxicam (MOBIC) 15 MG tablet Take 15 mg by mouth as needed for pain.    Marland Kitchen diltiazem (CARDIZEM CD) 120 MG 24 hr capsule Take 1 capsule (120 mg total) by mouth daily. (Patient not taking: Reported on 08/17/2017) 30 capsule 6   No current facility-administered medications for this visit.     Allergies:   Combigan [brimonidine tartrate-timolol] and Pneumococcal vaccines   Social History:  The patient  reports that she has never smoked. She has never used smokeless tobacco. She reports that she drinks alcohol. She reports that she does not use drugs.   Family History:  The patient's family history includes Breast cancer (age of onset: 36) in her daughter; Breast cancer (  age of onset: 21) in her mother; Cancer in her sister; Heart attack in her brother.    ROS:  Please see the history of present illness.   Otherwise, review of systems is positive for palpitations.   All other systems are reviewed and negative.   PHYSICAL EXAM: VS:  BP 136/86   Pulse 62   Ht 5\' 6"  (1.676 m)   Wt 140 lb (63.5 kg)   BMI 22.60 kg/m  , BMI Body mass index is 22.6 kg/m. GEN: Well nourished, well developed, in no acute distress  HEENT: normal  Neck: no JVD, carotid bruits, or masses Cardiac: RRR; no murmurs, rubs, or gallops,no edema  Respiratory:  clear to auscultation bilaterally, normal work of breathing GI: soft,  nontender, nondistended, + BS MS: no deformity or atrophy  Skin: warm and dry Neuro:  Strength and sensation are intact Psych: euthymic mood, full affect  EKG:  EKG is ordered today. Personal review of the ekg ordered shows sinus rhythm, left anterior fascicular block, rate 62, poor R wave progression   Recent Labs: No results found for requested labs within last 8760 hours.    Lipid Panel  No results found for: CHOL, TRIG, HDL, CHOLHDL, VLDL, LDLCALC, LDLDIRECT   Wt Readings from Last 3 Encounters:  08/17/17 140 lb (63.5 kg)  03/25/17 143 lb (64.9 kg)  12/22/16 147 lb (66.7 kg)      Other studies Reviewed: Additional studies/ records that were reviewed today include: TTE 06/08/16  Review of the above records today demonstrates:  - Left ventricle: Wall thickness was increased in a pattern of mild   LVH. The estimated ejection fraction was 55%. Doppler parameters   are consistent with both elevated ventricular end-diastolic   filling pressure and elevated left atrial filling pressure. - Aortic valve: There was mild regurgitation. - Mitral valve: There was mild regurgitation. - Left atrium: The atrium was moderately dilated. - Atrial septum: No defect or patent foramen ovale was identified.  ASSESSMENT AND PLAN:  1.  Persistent atrial fibrillation: Currently on Eliquis.  She continues to have symptoms from atrial fibrillation when she does not take her rate control.  She is quite fatigued on metoprolol.  We Tung Pustejovsky plan to stop this and switch her to diltiazem.  This patients CHA2DS2-VASc Score and unadjusted Ischemic Stroke Rate (% per year) is equal to 7.2 % stroke rate/year from a score of 5  Above score calculated as 1 point each if present [CHF, HTN, DM, Vascular=MI/PAD/Aortic Plaque, Age if 65-74, or Female] Above score calculated as 2 points each if present [Age > 75, or Stroke/TIA/TE]  2. Likely nonischemic cardiomyopathy: She has had improvement in her LV function.   Currently on Entresto and metoprolol.  Due to her fatigue, Isebella Upshur stop metoprolol and start diltiazem.    Current medicines are reviewed at length with the patient today.   The patient does not have concerns regarding her medicines.  The following changes were made today: Stop metoprolol, start diltiazem  Labs/ tests ordered today include:  Orders Placed This Encounter  Procedures  . EKG 12-Lead     Disposition:   FU with Karlis Cregg 6 months  Signed, Artia Singley Meredith Leeds, MD  08/17/2017 4:13 PM     [place] [address] [phone] (office) [phone] (fax)

## 2017-08-17 NOTE — Patient Instructions (Addendum)
Medication Instructions:  Your physician recommends that you continue on your current medications as directed. Please refer to the Current Medication list given to you today.  Labwork: None ordered  Testing/Procedures: None ordered  Follow-Up: Your physician wants you to follow-up in: 6 months with Dr. Camnitz.  You will receive a reminder letter in the mail two months in advance. If you don't receive a letter, please call our office to schedule the follow-up appointment.  * If you need a refill on your cardiac medications before your next appointment, please call your pharmacy.   *Please note that any paperwork needing to be filled out by the provider will need to be addressed at the front desk prior to seeing the provider. Please note that any FMLA, disability or other documents regarding health condition is subject to a $25.00 charge that must be received prior to completion of paperwork in the form of a money order or check.  Thank you for choosing [facility]!!   Lotus Santillo, RN [phone]        

## 2017-09-17 DIAGNOSIS — E039 Hypothyroidism, unspecified: Secondary | ICD-10-CM | POA: Diagnosis not present

## 2017-09-17 DIAGNOSIS — M170 Bilateral primary osteoarthritis of knee: Secondary | ICD-10-CM | POA: Diagnosis not present

## 2017-09-17 DIAGNOSIS — I10 Essential (primary) hypertension: Secondary | ICD-10-CM | POA: Diagnosis not present

## 2017-09-17 DIAGNOSIS — I5020 Unspecified systolic (congestive) heart failure: Secondary | ICD-10-CM | POA: Diagnosis not present

## 2017-09-17 DIAGNOSIS — H409 Unspecified glaucoma: Secondary | ICD-10-CM | POA: Diagnosis not present

## 2017-09-24 DIAGNOSIS — H409 Unspecified glaucoma: Secondary | ICD-10-CM | POA: Diagnosis not present

## 2017-09-24 DIAGNOSIS — E039 Hypothyroidism, unspecified: Secondary | ICD-10-CM | POA: Diagnosis not present

## 2017-09-24 DIAGNOSIS — M170 Bilateral primary osteoarthritis of knee: Secondary | ICD-10-CM | POA: Diagnosis not present

## 2017-09-24 DIAGNOSIS — I5020 Unspecified systolic (congestive) heart failure: Secondary | ICD-10-CM | POA: Diagnosis not present

## 2017-09-24 DIAGNOSIS — I10 Essential (primary) hypertension: Secondary | ICD-10-CM | POA: Diagnosis not present

## 2017-09-24 DIAGNOSIS — I480 Paroxysmal atrial fibrillation: Secondary | ICD-10-CM | POA: Diagnosis not present

## 2017-09-27 ENCOUNTER — Other Ambulatory Visit: Payer: Self-pay | Admitting: Geriatric Medicine

## 2017-09-27 DIAGNOSIS — Z1231 Encounter for screening mammogram for malignant neoplasm of breast: Secondary | ICD-10-CM

## 2017-09-28 DIAGNOSIS — H35359 Cystoid macular degeneration, unspecified eye: Secondary | ICD-10-CM | POA: Diagnosis not present

## 2017-11-08 ENCOUNTER — Ambulatory Visit
Admission: RE | Admit: 2017-11-08 | Discharge: 2017-11-08 | Disposition: A | Discharge status: Home / Self Care | Payer: Medicare Other | Source: Ambulatory Visit | Attending: Geriatric Medicine | Admitting: Geriatric Medicine

## 2017-11-08 DIAGNOSIS — Z1231 Encounter for screening mammogram for malignant neoplasm of breast: Secondary | ICD-10-CM | POA: Diagnosis not present

## 2017-11-17 DIAGNOSIS — I480 Paroxysmal atrial fibrillation: Secondary | ICD-10-CM | POA: Diagnosis not present

## 2017-11-17 DIAGNOSIS — I10 Essential (primary) hypertension: Secondary | ICD-10-CM | POA: Diagnosis not present

## 2017-11-17 DIAGNOSIS — H409 Unspecified glaucoma: Secondary | ICD-10-CM | POA: Diagnosis not present

## 2017-11-17 DIAGNOSIS — M170 Bilateral primary osteoarthritis of knee: Secondary | ICD-10-CM | POA: Diagnosis not present

## 2017-11-17 DIAGNOSIS — E039 Hypothyroidism, unspecified: Secondary | ICD-10-CM | POA: Diagnosis not present

## 2017-11-17 DIAGNOSIS — I5020 Unspecified systolic (congestive) heart failure: Secondary | ICD-10-CM | POA: Diagnosis not present

## 2017-12-14 DIAGNOSIS — H401223 Low-tension glaucoma, left eye, severe stage: Secondary | ICD-10-CM | POA: Diagnosis not present

## 2017-12-15 DIAGNOSIS — M170 Bilateral primary osteoarthritis of knee: Secondary | ICD-10-CM | POA: Diagnosis not present

## 2017-12-15 DIAGNOSIS — E039 Hypothyroidism, unspecified: Secondary | ICD-10-CM | POA: Diagnosis not present

## 2017-12-15 DIAGNOSIS — I10 Essential (primary) hypertension: Secondary | ICD-10-CM | POA: Diagnosis not present

## 2017-12-15 DIAGNOSIS — H409 Unspecified glaucoma: Secondary | ICD-10-CM | POA: Diagnosis not present

## 2017-12-15 DIAGNOSIS — I5020 Unspecified systolic (congestive) heart failure: Secondary | ICD-10-CM | POA: Diagnosis not present

## 2017-12-15 DIAGNOSIS — I480 Paroxysmal atrial fibrillation: Secondary | ICD-10-CM | POA: Diagnosis not present

## 2018-01-25 DIAGNOSIS — Z79899 Other long term (current) drug therapy: Secondary | ICD-10-CM | POA: Diagnosis not present

## 2018-01-25 DIAGNOSIS — E039 Hypothyroidism, unspecified: Secondary | ICD-10-CM | POA: Diagnosis not present

## 2018-01-25 DIAGNOSIS — D696 Thrombocytopenia, unspecified: Secondary | ICD-10-CM | POA: Diagnosis not present

## 2018-01-25 DIAGNOSIS — Z1389 Encounter for screening for other disorder: Secondary | ICD-10-CM | POA: Diagnosis not present

## 2018-01-25 DIAGNOSIS — M170 Bilateral primary osteoarthritis of knee: Secondary | ICD-10-CM | POA: Diagnosis not present

## 2018-01-25 DIAGNOSIS — R609 Edema, unspecified: Secondary | ICD-10-CM | POA: Diagnosis not present

## 2018-01-25 DIAGNOSIS — I5020 Unspecified systolic (congestive) heart failure: Secondary | ICD-10-CM | POA: Diagnosis not present

## 2018-01-25 DIAGNOSIS — I480 Paroxysmal atrial fibrillation: Secondary | ICD-10-CM | POA: Diagnosis not present

## 2018-01-25 DIAGNOSIS — I10 Essential (primary) hypertension: Secondary | ICD-10-CM | POA: Diagnosis not present

## 2018-01-25 DIAGNOSIS — Z0000 Encounter for general adult medical examination without abnormal findings: Secondary | ICD-10-CM | POA: Diagnosis not present

## 2018-01-28 ENCOUNTER — Encounter: Payer: Self-pay | Admitting: Cardiology

## 2018-02-09 DIAGNOSIS — M170 Bilateral primary osteoarthritis of knee: Secondary | ICD-10-CM | POA: Diagnosis not present

## 2018-02-09 DIAGNOSIS — E039 Hypothyroidism, unspecified: Secondary | ICD-10-CM | POA: Diagnosis not present

## 2018-02-09 DIAGNOSIS — H409 Unspecified glaucoma: Secondary | ICD-10-CM | POA: Diagnosis not present

## 2018-02-09 DIAGNOSIS — I5020 Unspecified systolic (congestive) heart failure: Secondary | ICD-10-CM | POA: Diagnosis not present

## 2018-02-09 DIAGNOSIS — I480 Paroxysmal atrial fibrillation: Secondary | ICD-10-CM | POA: Diagnosis not present

## 2018-02-09 DIAGNOSIS — I10 Essential (primary) hypertension: Secondary | ICD-10-CM | POA: Diagnosis not present

## 2018-02-10 NOTE — Progress Notes (Signed)
Electrophysiology Office Note   Date:  02/10/2018   ID:  Laura Lee, DOB 1931/08/25, MRN [id number]  PCP:  [name], MD  Primary Electrophysiologist:  [name], MD    No chief complaint on file.    History of Present Illness: Laura Lee is a 84 y.o. female who presents today for electrophysiology evaluation.   She was found to be in new onset atrial fibrillation with rapid ventricular response. She takes Eliquis for anticoagulation. Had cardioversion 02/14/16 and was started on amiodarone.  Amiodarone has since been stopped for fatigue.  She says that this did make her feel better, but she has continued to have significant amounts of fatigue throughout the day.  She does not have shortness of breath.  She has not had any chest pain.  Today, denies symptoms of palpitations, chest pain, shortness of breath, orthopnea, PND, lower extremity edema, claudication, dizziness, presyncope, syncope, bleeding, or neurologic sequela. The patient is tolerating medications without difficulties.  Overall she is doing well.  She does continue to complain of fatigue and weakness.  This did not improve after stopping metoprolol and starting diltiazem.  She is eating better and trying to exercise which has helped somewhat.   Past Medical History:  Diagnosis Date  . Atrial fibrillation ([place])   . Cancer (HCC)    Breast and thyroid cancer  . Glaucoma   . Hearing impaired    Hearing aids  . Hypertension   . Osteopenia 05/2013   T score -1.9 FRAX 15%/4.3%  . Thyroid disease    Cancer   Past Surgical History:  Procedure Laterality Date  . APPENDECTOMY    . BREAST EXCISIONAL BIOPSY Right    benign  . BREAST LUMPECTOMY     [id number] radiation  . BREAST SURGERY  1989   Lumpectomy right breast  . CARDIOVERSION N/A 02/14/2016   Procedure: CARDIOVERSION;  Surgeon: Sueanne Margarita, MD;  Location: [facility];  Service: Cardiovascular;  Laterality: N/A;  . Mole excised     Benign    . THYROID SURGERY     Removed 1 lobe  . TONSILLECTOMY       Current Outpatient Medications  Medication Sig Dispense Refill  . calcium citrate-vitamin D (CITRACAL+D) 315-200 MG-UNIT per tablet Take 1 tablet by mouth 2 (two) times daily.    . Cholecalciferol (VITAMIN D PO) Take 1 tablet by mouth daily.     Marland Kitchen diltiazem (CARDIZEM CD) 120 MG 24 hr capsule Take 1 capsule (120 mg total) by mouth daily. (Patient not taking: Reported on 08/17/2017) 30 capsule 6  . ELIQUIS 5 MG TABS tablet Take 5 mg by mouth 2 (two) times daily.    Marland Kitchen ENTRESTO 24-26 MG TAKE 1 TABLET BY MOUTH TWICE DAILY 180 tablet 2  . levothyroxine (SYNTHROID, LEVOTHROID) 88 MCG tablet Take 88 mcg by mouth daily before breakfast.    . LUMIGAN 0.01 % SOLN Place 1 drop into the left eye at bedtime.     . meloxicam (MOBIC) 15 MG tablet Take 15 mg by mouth as needed for pain.     No current facility-administered medications for this visit.     Allergies:   Combigan [brimonidine tartrate-timolol] and Pneumococcal vaccines   Social History:  The patient  reports that she has never smoked. She has never used smokeless tobacco. She reports that she drinks alcohol. She reports that she does not use drugs.   Family History:  The patient's family history includes Breast cancer (age  of onset: 78) in her daughter; Breast cancer (age of onset: 70) in her mother; Cancer in her sister; Heart attack in her brother.    ROS:  Please see the history of present illness.   Otherwise, review of systems is positive for leg swelling.   All other systems are reviewed and negative.   PHYSICAL EXAM: VS:  There were no vitals taken for this visit. , BMI There is no height or weight on file to calculate BMI. GEN: Well nourished, well developed, in no acute distress  HEENT: normal  Neck: no JVD, carotid bruits, or masses Cardiac: RRR; no murmurs, rubs, or gallops,no edema  Respiratory:  clear to auscultation bilaterally, normal work of breathing GI:  soft, nontender, nondistended, + BS MS: no deformity or atrophy  Skin: warm and dry Neuro:  Strength and sensation are intact Psych: euthymic mood, full affect  EKG:  EKG is ordered today. Personal review of the ekg ordered shows rhythm, left anterior fascicular block, rate 78, possible anterolateral infarct   Recent Labs: No results found for requested labs within last 8760 hours.    Lipid Panel  No results found for: CHOL, TRIG, HDL, CHOLHDL, VLDL, LDLCALC, LDLDIRECT   Wt Readings from Last 3 Encounters:  08/17/17 140 lb (63.5 kg)  03/25/17 143 lb (64.9 kg)  12/22/16 147 lb (66.7 kg)      Other studies Reviewed: Additional studies/ records that were reviewed today include: TTE 06/08/16  Review of the above records today demonstrates:  - Left ventricle: Wall thickness was increased in a pattern of mild   LVH. The estimated ejection fraction was 55%. Doppler parameters   are consistent with both elevated ventricular end-diastolic   filling pressure and elevated left atrial filling pressure. - Aortic valve: There was mild regurgitation. - Mitral valve: There was mild regurgitation. - Left atrium: The atrium was moderately dilated. - Atrial septum: No defect or patent foramen ovale was identified.  ASSESSMENT AND PLAN:  1.  Persistent atrial fibrillation: Currently on Eliquis and diltiazem.  No symptoms of atrial fibrillation.  She is in sinus rhythm today.  No changes. This patients CHA2DS2-VASc Score and unadjusted Ischemic Stroke Rate (% per year) is equal to 7.2 % stroke rate/year from a score of 5  Above score calculated as 1 point each if present [CHF, HTN, DM, Vascular=MI/PAD/Aortic Plaque, Age if 65-74, or Female] Above score calculated as 2 points each if present [Age > 75, or Stroke/TIA/TE]   2. Likely nonischemic cardiomyopathy: Had improvement in LV function.  Currently on Entresto and diltiazem.  She is having some lower extremity swelling which could be due  to venous insufficiency.  I told her to wear support stockings.  At this point, it is unclear to me as to what is causing her weakness and fatigue.  I do not feel that this is due to her cardiomyopathy as this is improved and she is in sinus rhythm.   Current medicines are reviewed at length with the patient today.   The patient does not have concerns regarding her medicines.  The following changes were made today: None  Labs/ tests ordered today include:  No orders of the defined types were placed in this encounter.    Disposition:   FU with Yancy Knoble 6 months  Signed, Jamen Loiseau Meredith Leeds, MD  02/10/2018 2:46 PM     [facility] [address] [place] [address] [phone] (office) [phone] (fax)

## 2018-02-11 ENCOUNTER — Ambulatory Visit (INDEPENDENT_AMBULATORY_CARE_PROVIDER_SITE_OTHER): Payer: Medicare Other | Admitting: Cardiology

## 2018-02-11 ENCOUNTER — Encounter: Payer: Self-pay | Admitting: Cardiology

## 2018-02-11 VITALS — BP 136/66 | HR 78 | Ht 66.0 in | Wt 143.0 lb

## 2018-02-11 DIAGNOSIS — I4819 Other persistent atrial fibrillation: Secondary | ICD-10-CM

## 2018-02-11 DIAGNOSIS — I481 Persistent atrial fibrillation: Secondary | ICD-10-CM | POA: Diagnosis not present

## 2018-02-11 DIAGNOSIS — I428 Other cardiomyopathies: Secondary | ICD-10-CM

## 2018-02-11 NOTE — Patient Instructions (Signed)
Medication Instructions:  Your physician recommends that you continue on your current medications as directed. Please refer to the Current Medication list given to you today.  If you need a refill on your cardiac medications before your next appointment, please call your pharmacy.   Labwork: None ordered  Testing/Procedures: None ordered  Follow-Up: Your physician wants you to follow-up in: 6 months with Dr. Camnitz.  You will receive a reminder letter in the mail two months in advance. If you don't receive a letter, please call our office to schedule the follow-up appointment.  Thank you for choosing [facility]!!   Robby Bulkley, RN [phone]         

## 2018-02-17 DIAGNOSIS — Z23 Encounter for immunization: Secondary | ICD-10-CM | POA: Diagnosis not present

## 2018-03-05 ENCOUNTER — Other Ambulatory Visit: Payer: Self-pay | Admitting: Cardiology

## 2018-04-03 ENCOUNTER — Other Ambulatory Visit: Payer: Self-pay | Admitting: Cardiology

## 2018-04-18 ENCOUNTER — Encounter: Payer: Self-pay | Admitting: *Deleted

## 2018-04-19 ENCOUNTER — Ambulatory Visit (INDEPENDENT_AMBULATORY_CARE_PROVIDER_SITE_OTHER): Payer: Medicare Other | Admitting: Cardiology

## 2018-04-19 ENCOUNTER — Encounter: Payer: Self-pay | Admitting: Cardiology

## 2018-04-19 VITALS — BP 124/80 | HR 68 | Ht 66.0 in | Wt 143.0 lb

## 2018-04-19 DIAGNOSIS — I428 Other cardiomyopathies: Secondary | ICD-10-CM

## 2018-04-19 DIAGNOSIS — Z79899 Other long term (current) drug therapy: Secondary | ICD-10-CM

## 2018-04-19 DIAGNOSIS — R5383 Other fatigue: Secondary | ICD-10-CM

## 2018-04-19 DIAGNOSIS — I4819 Other persistent atrial fibrillation: Secondary | ICD-10-CM

## 2018-04-19 MED ORDER — DILTIAZEM HCL ER COATED BEADS 180 MG PO CP24: 180.0000 mg | ORAL_CAPSULE | Freq: Every day | ORAL | 3 refills | Status: DC

## 2018-04-19 NOTE — Progress Notes (Signed)
Electrophysiology Office Note   Date:  04/19/2018   ID:  Laura Lee, DOB [birth year], MRN [id number]  PCP:  [name], MD  Primary Electrophysiologist:  Dr Curt Bears  CC: Follow up for atrial fibrillation   History of Present Illness: Laura Lee is a 83 y.o. female who presents today for electrophysiology evaluation.   She was found to be in new onset atrial fibrillation with rapid ventricular response. Had cardioversion 02/14/16 and was started on amiodarone.  Amiodarone has since been stopped for fatigue.  She has also tried Coreg, Toprol, and now diltiazem to help with her symptoms of fatigue. She admits that she feels more fatigued with increased palpitations during times of stress (ie preparing meals for the holidays).  She does not have shortness of breath.  She has not had any chest pain. She states that she has had some nose bleeds lately but each episode resolved quickly.  Today, denies symptoms of chest pain, shortness of breath, orthopnea, PND, lower extremity edema, claudication, dizziness, presyncope, syncope, or neurologic sequela. The patient is tolerating medications without difficulties.   Past Medical History:  Diagnosis Date  . Atrial fibrillation ([place])   . Cancer (HCC)    Breast and thyroid cancer  . Glaucoma   . Hearing impaired    Hearing aids  . Hypertension   . Osteopenia 05/2013   T score -1.9 FRAX 15%/4.3%  . Thyroid disease    Cancer   Past Surgical History:  Procedure Laterality Date  . APPENDECTOMY    . BREAST EXCISIONAL BIOPSY Right    benign  . BREAST LUMPECTOMY     [id number] radiation  . BREAST SURGERY  1989   Lumpectomy right breast  . CARDIOVERSION N/A 02/14/2016   Procedure: CARDIOVERSION;  Surgeon: Sueanne Margarita, MD;  Location: [facility];  Service: Cardiovascular;  Laterality: N/A;  . Mole excised     Benign  . THYROID SURGERY     Removed 1 lobe  . TONSILLECTOMY       Current Outpatient Medications  Medication Sig  Dispense Refill  . calcium citrate-vitamin D (CITRACAL+D) 315-200 MG-UNIT per tablet Take 1 tablet by mouth 2 (two) times daily.    Marland Kitchen CARTIA XT 120 MG 24 hr capsule TAKE 1 CAPSULE BY MOUTH ONCE DAILY 90 capsule 3  . Cholecalciferol (VITAMIN D PO) Take 1 tablet by mouth daily.     Marland Kitchen ELIQUIS 5 MG TABS tablet Take 5 mg by mouth 2 (two) times daily.    Marland Kitchen ENTRESTO 24-26 MG TAKE 1 TABLET BY MOUTH TWICE DAILY 180 tablet 2  . levothyroxine (SYNTHROID, LEVOTHROID) 88 MCG tablet Take 88 mcg by mouth daily before breakfast.    . LUMIGAN 0.01 % SOLN Place 1 drop into the left eye at bedtime.     . meloxicam (MOBIC) 15 MG tablet Take 15 mg by mouth as needed for pain.     No current facility-administered medications for this visit.     Allergies:   Combigan [brimonidine tartrate-timolol] and Pneumococcal vaccines   Social History:  The patient  reports that she has never smoked. She has never used smokeless tobacco. She reports that she drinks alcohol. She reports that she does not use drugs.   Family History:  The patient's family history includes Breast cancer (age of onset: 12) in her daughter; Breast cancer (age of onset: 21) in her mother; Heart attack in her brother; Lung cancer in her sister.    ROS:  Please see  the history of present illness. All other systems are reviewed and negative.   PHYSICAL EXAM: VS:  BP 124/80   Pulse 68   Ht 5\' 6"  (1.676 m)   Wt 143 lb (64.9 kg)   BMI 23.08 kg/m  , BMI Body mass index is 23.08 kg/m. GEN: Well nourished, well developed, elderly female in no acute distress  HEENT: normal  Neck: no JVD, carotid bruits, or masses Cardiac: RRR; no murmurs, rubs, or gallops,no edema  Respiratory:  clear to auscultation bilaterally, normal work of breathing MS: no deformity or atrophy  Skin: warm and dry Neuro:  Strength and sensation are intact Psych: euthymic mood, full affect  EKG:  EKG is ordered today. Personal review of the ekg ordered shows sinus rhythm  HR 68    Wt Readings from Last 3 Encounters:  04/19/18 143 lb (64.9 kg)  02/11/18 143 lb (64.9 kg)  08/17/17 140 lb (63.5 kg)      Other studies Reviewed: Additional studies/ records that were reviewed today include: TTE 06/08/16  Review of the above records today demonstrates:  - Left ventricle: Wall thickness was increased in a pattern of mild   LVH. The estimated ejection fraction was 55%. Doppler parameters   are consistent with both elevated ventricular end-diastolic   filling pressure and elevated left atrial filling pressure. - Aortic valve: There was mild regurgitation. - Mitral valve: There was mild regurgitation. - Left atrium: The atrium was moderately dilated. - Atrial septum: No defect or patent foramen ovale was identified.  ASSESSMENT AND PLAN:  1.  Persistent atrial fibrillation: Rare symptoms of palpitations, usually during times of stress. She is in sinus rhythm today.   Continue Eliquis 5mg  BID Laura Lee increase diltiazem from 120 to 180mg  daily  This patients CHA2DS2-VASc Score and unadjusted Ischemic Stroke Rate (% per year) is equal to 7.2 % stroke rate/year from a score of 5  Above score calculated as 1 point each if present [CHF, HTN, DM, Vascular=MI/PAD/Aortic Plaque, Age if 65-74, or Female] Above score calculated as 2 points each if present [Age > 75, or Stroke/TIA/TE]   2. Likely nonischemic cardiomyopathy: Had improvement in LV function. Currently on Entresto. Her lower extremity swelling has improved since her last visit.  Continue present therapy. Continue support stockings and foot elevation. Had improvement in LV function.  Currently on Entresto and diltiazem.  She is having some lower extremity swelling which could be due to venous insufficiency.  I told her to wear support stockings.    3. Fatigue: Patient continues to complain of near constant fatigue. At this point, it is unclear what is causing her weakness and fatigue.  I do not feel that this  is due to her cardiomyopathy as this is improved and she is in sinus rhythm. Increase diltiazem as above. Check CBC   Current medicines are reviewed at length with the patient today.   The patient does not have concerns regarding her medicines.  The following changes were made today: increase diltiazem  Labs/ tests ordered today include:  Orders Placed This Encounter  Procedures  . CBC  . EKG 12-Lead     Disposition:   FU with Laura Lee in 3 months  Laura Lee, [place]  04/19/2018 4:23 PM     [facility] [address] [place] [address] [phone] (office) [phone] (fax)  I have seen and examined this patient with Laura Lee.  Agree with above, note added to reflect my findings.  On exam, RRR, no murmurs, lungs clear. Patient continues to be in sinus rhythm. Potentially went into AF over the past month. Was having stress with insurance which has been resolved and is now feeling better. Continues to have fatigue. Laura Lee check CBC to see if anemia is possibly a cause.    Laura Lee M. Lyell Clugston MD 04/20/2018 8:20 AM

## 2018-04-19 NOTE — Patient Instructions (Addendum)
Medication Instructions:  Your physician has recommended you make the following change in your medication:  1. INCREASE Diltiazem to 180 mg once daily  * If you need a refill on your cardiac medications before your next appointment, please call your pharmacy.   Labwork: CBC today *We will only notify you of abnormal results, otherwise continue current treatment plan.  Testing/Procedures: None ordered  Follow-Up: Your physician recommends that you schedule a follow-up appointment in: 3 months with Dr. Curt Bears.   Thank you for choosing [facility]!!   Trinidad Curet, RN [phone]

## 2018-04-20 ENCOUNTER — Encounter: Payer: Self-pay | Admitting: Cardiology

## 2018-04-20 LAB — CBC
Hematocrit: 43.2 % (ref 34.0–46.6)
Hemoglobin: 14.4 g/dL (ref 11.1–15.9)
MCH: 28.7 pg (ref 26.6–33.0)
MCHC: 33.3 g/dL (ref 31.5–35.7)
MCV: 86 fL (ref 79–97)
RBC: 5.01 x10E6/uL (ref 3.77–5.28)
RDW: 13.3 % (ref 12.3–15.4)
WBC: 7.9 10*3/uL (ref 3.4–10.8)

## 2018-04-27 DIAGNOSIS — E039 Hypothyroidism, unspecified: Secondary | ICD-10-CM | POA: Diagnosis not present

## 2018-04-27 DIAGNOSIS — I5020 Unspecified systolic (congestive) heart failure: Secondary | ICD-10-CM | POA: Diagnosis not present

## 2018-04-27 DIAGNOSIS — I480 Paroxysmal atrial fibrillation: Secondary | ICD-10-CM | POA: Diagnosis not present

## 2018-04-27 DIAGNOSIS — M170 Bilateral primary osteoarthritis of knee: Secondary | ICD-10-CM | POA: Diagnosis not present

## 2018-04-27 DIAGNOSIS — I10 Essential (primary) hypertension: Secondary | ICD-10-CM | POA: Diagnosis not present

## 2018-04-27 DIAGNOSIS — H409 Unspecified glaucoma: Secondary | ICD-10-CM | POA: Diagnosis not present

## 2018-06-29 ENCOUNTER — Other Ambulatory Visit (HOSPITAL_COMMUNITY): Payer: Self-pay | Admitting: Geriatric Medicine

## 2018-06-29 DIAGNOSIS — I4819 Other persistent atrial fibrillation: Secondary | ICD-10-CM | POA: Diagnosis not present

## 2018-06-29 DIAGNOSIS — I872 Venous insufficiency (chronic) (peripheral): Secondary | ICD-10-CM | POA: Diagnosis not present

## 2018-06-29 DIAGNOSIS — M7989 Other specified soft tissue disorders: Secondary | ICD-10-CM

## 2018-06-29 DIAGNOSIS — M79605 Pain in left leg: Secondary | ICD-10-CM

## 2018-06-29 DIAGNOSIS — I10 Essential (primary) hypertension: Secondary | ICD-10-CM | POA: Diagnosis not present

## 2018-06-30 ENCOUNTER — Ambulatory Visit (HOSPITAL_COMMUNITY)
Admission: RE | Admit: 2018-06-30 | Discharge: 2018-06-30 | Disposition: A | Discharge status: Home / Self Care | Payer: Medicare Other | Source: Ambulatory Visit | Attending: Geriatric Medicine | Admitting: Geriatric Medicine

## 2018-06-30 DIAGNOSIS — M7989 Other specified soft tissue disorders: Secondary | ICD-10-CM | POA: Insufficient documentation

## 2018-06-30 DIAGNOSIS — M79605 Pain in left leg: Secondary | ICD-10-CM | POA: Diagnosis not present

## 2018-06-30 NOTE — Progress Notes (Signed)
Left lower extremity venous duplex exam completed. Please see preliminary notes on CV PROC under chart review.  Result called ordering physician's office spoke with Ms. Marye Round.  Yoon Barca H Evelena Masci(RDMS RVT) 06/30/18 10:26 AM

## 2018-07-19 ENCOUNTER — Encounter: Payer: Self-pay | Admitting: Cardiology

## 2018-07-19 ENCOUNTER — Ambulatory Visit (INDEPENDENT_AMBULATORY_CARE_PROVIDER_SITE_OTHER): Payer: Medicare Other | Admitting: Cardiology

## 2018-07-19 VITALS — BP 112/60 | HR 130 | Ht 66.0 in | Wt 143.0 lb

## 2018-07-19 DIAGNOSIS — I4819 Other persistent atrial fibrillation: Secondary | ICD-10-CM

## 2018-07-19 MED ORDER — FLECAINIDE ACETATE 50 MG PO TABS: 50.0000 mg | ORAL_TABLET | Freq: Two times a day (BID) | ORAL | 3 refills | Status: DC

## 2018-07-19 NOTE — H&P (View-Only) (Signed)
Electrophysiology Office Note   Date:  07/20/2018   ID:  [name], [name] June 08, 1931, MRN [id number]  PCP:  [name], MD  Primary Electrophysiologist:  Dr Curt Bears  CC: Follow up for atrial fibrillation   History of Present Illness: Laura Lee is a 85 y.o. female who presents today for electrophysiology evaluation.   She was found to be in new onset atrial fibrillation with rapid ventricular response. Had cardioversion 02/14/16 and was started on amiodarone.  Amiodarone has since been stopped for fatigue.  She has also tried Coreg, Toprol, and now diltiazem to help with her symptoms of fatigue. She admits that she feels more fatigued with increased palpitations during times of stress (ie preparing meals for the holidays).  She does not have shortness of breath.  She has not had any chest pain. She states that she has had some nose bleeds lately but each episode resolved quickly.  Today, denies symptoms of palpitations, chest pain, shortness of breath, orthopnea, PND, lower extremity edema, claudication, dizziness, presyncope, syncope, bleeding, or neurologic sequela. The patient is tolerating medications without difficulties.  She feels quite fatigued today.  EKG shows atrial fibrillation with rates in the 130s.  She also has varicose veins that she is being treated for by her primary physician.  She has no chest pain or shortness of breath.   Past Medical History:  Diagnosis Date  . Atrial fibrillation ([place])   . Cancer (HCC)    Breast and thyroid cancer  . Glaucoma   . Hearing impaired    Hearing aids  . Hypertension   . Osteopenia 05/2013   T score -1.9 FRAX 15%/4.3%  . Thyroid disease    Cancer   Past Surgical History:  Procedure Laterality Date  . APPENDECTOMY    . BREAST EXCISIONAL BIOPSY Right    benign  . BREAST LUMPECTOMY     [id number] radiation  . BREAST SURGERY  1989   Lumpectomy right breast  . CARDIOVERSION N/A 02/14/2016   Procedure: CARDIOVERSION;   Surgeon: Sueanne Margarita, MD;  Location: [facility];  Service: Cardiovascular;  Laterality: N/A;  . Mole excised     Benign  . THYROID SURGERY     Removed 1 lobe  . TONSILLECTOMY       Current Outpatient Medications  Medication Sig Dispense Refill  . calcium citrate-vitamin D (CITRACAL+D) 315-200 MG-UNIT per tablet Take 1 tablet by mouth 2 (two) times daily.    . Cholecalciferol (VITAMIN D PO) Take 1 tablet by mouth daily.     Marland Kitchen ELIQUIS 5 MG TABS tablet Take 5 mg by mouth 2 (two) times daily.    Marland Kitchen ENTRESTO 24-26 MG TAKE 1 TABLET BY MOUTH TWICE DAILY 180 tablet 2  . levothyroxine (SYNTHROID, LEVOTHROID) 88 MCG tablet Take 88 mcg by mouth daily before breakfast.    . LUMIGAN 0.01 % SOLN Place 1 drop into the left eye at bedtime.     . meloxicam (MOBIC) 15 MG tablet Take 15 mg by mouth as needed for pain.    Marland Kitchen diltiazem (CARDIZEM CD) 180 MG 24 hr capsule Take 1 capsule (180 mg total) by mouth daily. 30 capsule 3  . flecainide (TAMBOCOR) 50 MG tablet Take 1 tablet (50 mg total) by mouth 2 (two) times daily. 60 tablet 3   No current facility-administered medications for this visit.     Allergies:   Combigan [brimonidine tartrate-timolol] and Pneumococcal vaccines   Social History:  The patient  reports that  she has never smoked. She has never used smokeless tobacco. She reports current alcohol use. She reports that she does not use drugs.   Family History:  The patient's family history includes Breast cancer (age of onset: 53) in her daughter; Breast cancer (age of onset: 53) in her mother; Heart attack in her brother; Lung cancer in her sister.    ROS:  Please see the history of present illness.   Otherwise, review of systems is positive for weakness, fatigue.   All other systems are reviewed and negative.   PHYSICAL EXAM: VS:  BP 112/60   Pulse (!) 130   Ht 5\' 6"  (1.676 m)   Wt 143 lb (64.9 kg)   BMI 23.08 kg/m  , BMI Body mass index is 23.08 kg/m. GEN: Well nourished, well  developed, in no acute distress  HEENT: normal  Neck: no JVD, carotid bruits, or masses Cardiac: Tachycardic, irregular; no murmurs, rubs, or gallops,no edema  Respiratory:  clear to auscultation bilaterally, normal work of breathing GI: soft, nontender, nondistended, + BS MS: no deformity or atrophy  Skin: warm and dry Neuro:  Strength and sensation are intact Psych: euthymic mood, full affect  EKG:  EKG is ordered today. Personal review of the ekg ordered shows atrial fibrillation, rate 130   Wt Readings from Last 3 Encounters:  07/19/18 143 lb (64.9 kg)  04/19/18 143 lb (64.9 kg)  02/11/18 143 lb (64.9 kg)      Other studies Reviewed: Additional studies/ records that were reviewed today include: TTE 06/08/16  Review of the above records today demonstrates:  - Left ventricle: Wall thickness was increased in a pattern of mild   LVH. The estimated ejection fraction was 55%. Doppler parameters   are consistent with both elevated ventricular end-diastolic   filling pressure and elevated left atrial filling pressure. - Aortic valve: There was mild regurgitation. - Mitral valve: There was mild regurgitation. - Left atrium: The atrium was moderately dilated. - Atrial septum: No defect or patent foramen ovale was identified.  ASSESSMENT AND PLAN:  1.  Persistent atrial fibrillation: Unfortunately she is in atrial fibrillation today.  She is currently on Eliquis and diltiazem.  I do feel that she needs a rhythm control strategy.  We Radford Pease thus start her on flecainide 50 mg.  We Choice Kleinsasser plan for her to come back in 1 week for an ECG.  If she remains in atrial fibrillation, Trinady Milewski plan for cardioversion.  We Jatasia Gundrum have her follow-up in A. fib clinic in 1 month.    This patients CHA2DS2-VASc Score and unadjusted Ischemic Stroke Rate (% per year) is equal to 7.2 % stroke rate/year from a score of 5  Above score calculated as 1 point each if present [CHF, HTN, DM, Vascular=MI/PAD/Aortic Plaque,  Age if 65-74, or Female] Above score calculated as 2 points each if present [Age > 75, or Stroke/TIA/TE]   2. Likely nonischemic cardiomyopathy: Fortunately has had improvement in LV function.  Currently on Entresto.  She did not tolerate beta-blockers.  Her ejection fraction has improved back to normal.venous insufficiency.  I told her to wear support stockings.    3. Fatigue: At this point, her fatigue does appear to be due to her atrial fibrillation.  Plan for rhythm control strategy.   Current medicines are reviewed at length with the patient today.   The patient does not have concerns regarding her medicines.  The following changes were made today: increase diltiazem  Labs/ tests ordered today include:  Orders Placed This Encounter  Procedures  . EKG 12-Lead     Disposition:   FU with Johnae Friley in 3 months   Taeja Debellis M. Doll Frazee MD 07/20/2018 10:36 AM

## 2018-07-19 NOTE — Patient Instructions (Signed)
Medication Instructions:  Your physician has recommended you make the following change in your medication:  1. START Flecainide 50 mg twice daily  * If you need a refill on your cardiac medications before your next appointment, please call your pharmacy.   Labwork: None ordered  Testing/Procedures: None ordered  Follow-Up: Your physician recommends that you schedule a follow-up appointment in: 1 week for nurse visit EKG (if you are still out of rhythm at this appointment we will arrange a cardioversion)  Your physician recommends that you schedule a follow-up appointment in: 1 month with Ricky in the AFib clinic.  Your physician recommends that you schedule a follow-up appointment in: 3 months with Dr. Curt Bears.    Thank you for choosing [facility]!!   Trinidad Curet, RN [phone]  Any Other Special Instructions Will Be Listed Below (If Applicable).

## 2018-07-19 NOTE — Progress Notes (Signed)
Electrophysiology Office Note   Date:  07/20/2018   ID:  Laura Lee 01-22-1932, MRN [id number]  PCP:  Laura Manes, MD  Primary Electrophysiologist:  Dr Curt Bears  CC: Follow up for atrial fibrillation   History of Present Illness: Laura Lee is a 86 y.o. female who presents today for electrophysiology evaluation.   She was found to be in new onset atrial fibrillation with rapid ventricular response. Had cardioversion 02/14/16 and was started on amiodarone.  Amiodarone has since been stopped for fatigue.  She has also tried Coreg, Toprol, and now diltiazem to help with her symptoms of fatigue. She admits that she feels more fatigued with increased palpitations during times of stress (ie preparing meals for the holidays).  She does not have shortness of breath.  She has not had any chest pain. She states that she has had some nose bleeds lately but each episode resolved quickly.  Today, denies symptoms of palpitations, chest pain, shortness of breath, orthopnea, PND, lower extremity edema, claudication, dizziness, presyncope, syncope, bleeding, or neurologic sequela. The patient is tolerating medications without difficulties.  She feels quite fatigued today.  EKG shows atrial fibrillation with rates in the 130s.  She also has varicose veins that she is being treated for by her primary physician.  She has no chest pain or shortness of breath.   Past Medical History:  Diagnosis Date  . Atrial fibrillation ([place])   . Cancer (HCC)    Breast and thyroid cancer  . Glaucoma   . Hearing impaired    Hearing aids  . Hypertension   . Osteopenia 05/2013   T score -1.9 FRAX 15%/4.3%  . Thyroid disease    Cancer   Past Surgical History:  Procedure Laterality Date  . APPENDECTOMY    . BREAST EXCISIONAL BIOPSY Right    benign  . BREAST LUMPECTOMY     [id number] radiation  . BREAST SURGERY  1989   Lumpectomy right breast  . CARDIOVERSION N/A 02/14/2016   Procedure: CARDIOVERSION;   Surgeon: Sueanne Margarita, MD;  Location: [facility];  Service: Cardiovascular;  Laterality: N/A;  . Mole excised     Benign  . THYROID SURGERY     Removed 1 lobe  . TONSILLECTOMY       Current Outpatient Medications  Medication Sig Dispense Refill  . calcium citrate-vitamin D (CITRACAL+D) 315-200 MG-UNIT per tablet Take 1 tablet by mouth 2 (two) times daily.    . Cholecalciferol (VITAMIN D PO) Take 1 tablet by mouth daily.     Marland Kitchen ELIQUIS 5 MG TABS tablet Take 5 mg by mouth 2 (two) times daily.    Marland Kitchen ENTRESTO 24-26 MG TAKE 1 TABLET BY MOUTH TWICE DAILY 180 tablet 2  . levothyroxine (SYNTHROID, LEVOTHROID) 88 MCG tablet Take 88 mcg by mouth daily before breakfast.    . LUMIGAN 0.01 % SOLN Place 1 drop into the left eye at bedtime.     . meloxicam (MOBIC) 15 MG tablet Take 15 mg by mouth as needed for pain.    Marland Kitchen diltiazem (CARDIZEM CD) 180 MG 24 hr capsule Take 1 capsule (180 mg total) by mouth daily. 30 capsule 3  . flecainide (TAMBOCOR) 50 MG tablet Take 1 tablet (50 mg total) by mouth 2 (two) times daily. 60 tablet 3   No current facility-administered medications for this visit.     Allergies:   Combigan [brimonidine tartrate-timolol] and Pneumococcal vaccines   Social History:  The patient  reports that  she has never smoked. She has never used smokeless tobacco. She reports current alcohol use. She reports that she does not use drugs.   Family History:  The patient's family history includes Breast cancer (age of onset: 63) in her daughter; Breast cancer (age of onset: 38) in her mother; Heart attack in her brother; Lung cancer in her sister.    ROS:  Please see the history of present illness.   Otherwise, review of systems is positive for weakness, fatigue.   All other systems are reviewed and negative.   PHYSICAL EXAM: VS:  BP 112/60   Pulse (!) 130   Ht 5\' 6"  (1.676 m)   Wt 143 lb (64.9 kg)   BMI 23.08 kg/m  , BMI Body mass index is 23.08 kg/m. GEN: Well nourished, well  developed, in no acute distress  HEENT: normal  Neck: no JVD, carotid bruits, or masses Cardiac: Tachycardic, irregular; no murmurs, rubs, or gallops,no edema  Respiratory:  clear to auscultation bilaterally, normal work of breathing GI: soft, nontender, nondistended, + BS MS: no deformity or atrophy  Skin: warm and dry Neuro:  Strength and sensation are intact Psych: euthymic mood, full affect  EKG:  EKG is ordered today. Personal review of the ekg ordered shows atrial fibrillation, rate 130   Wt Readings from Last 3 Encounters:  07/19/18 143 lb (64.9 kg)  04/19/18 143 lb (64.9 kg)  02/11/18 143 lb (64.9 kg)      Other studies Reviewed: Additional studies/ records that were reviewed today include: TTE 06/08/16  Review of the above records today demonstrates:  - Left ventricle: Wall thickness was increased in a pattern of mild   LVH. The estimated ejection fraction was 55%. Doppler parameters   are consistent with both elevated ventricular end-diastolic   filling pressure and elevated left atrial filling pressure. - Aortic valve: There was mild regurgitation. - Mitral valve: There was mild regurgitation. - Left atrium: The atrium was moderately dilated. - Atrial septum: No defect or patent foramen ovale was identified.  ASSESSMENT AND PLAN:  1.  Persistent atrial fibrillation: Unfortunately she is in atrial fibrillation today.  She is currently on Eliquis and diltiazem.  I do feel that she needs a rhythm control strategy.  We [name] thus start her on flecainide 50 mg.  We [name] plan for her to come back in 1 week for an ECG.  If she remains in atrial fibrillation, [name] plan for cardioversion.  We [name] have her follow-up in A. fib clinic in 1 month.    This patients CHA2DS2-VASc Score and unadjusted Ischemic Stroke Rate (% per year) is equal to 7.2 % stroke rate/year from a score of 5  Above score calculated as 1 point each if present [CHF, HTN, DM, Vascular=MI/PAD/Aortic Plaque,  Age if 65-74, or Female] Above score calculated as 2 points each if present [Age > 75, or Stroke/TIA/TE]   2. Likely nonischemic cardiomyopathy: Fortunately has had improvement in LV function.  Currently on Entresto.  She did not tolerate beta-blockers.  Her ejection fraction has improved back to normal.venous insufficiency.  I told her to wear support stockings.    3. Fatigue: At this point, her fatigue does appear to be due to her atrial fibrillation.  Plan for rhythm control strategy.   Current medicines are reviewed at length with the patient today.   The patient does not have concerns regarding her medicines.  The following changes were made today: increase diltiazem  Labs/ tests ordered today include:  Orders Placed This Encounter  Procedures  . EKG 12-Lead     Disposition:   FU with Laura Lee in 3 months   Miyana Mordecai M. Solyana Nonaka MD 07/20/2018 10:36 AM

## 2018-07-20 ENCOUNTER — Encounter: Payer: Self-pay | Admitting: Cardiology

## 2018-07-27 DIAGNOSIS — I480 Paroxysmal atrial fibrillation: Secondary | ICD-10-CM | POA: Diagnosis not present

## 2018-07-27 DIAGNOSIS — I10 Essential (primary) hypertension: Secondary | ICD-10-CM | POA: Diagnosis not present

## 2018-07-27 DIAGNOSIS — D696 Thrombocytopenia, unspecified: Secondary | ICD-10-CM | POA: Diagnosis not present

## 2018-07-27 DIAGNOSIS — Z79899 Other long term (current) drug therapy: Secondary | ICD-10-CM | POA: Diagnosis not present

## 2018-07-29 ENCOUNTER — Ambulatory Visit (INDEPENDENT_AMBULATORY_CARE_PROVIDER_SITE_OTHER): Payer: Medicare Other

## 2018-07-29 VITALS — HR 123 | Ht 66.0 in | Wt 148.0 lb

## 2018-07-29 DIAGNOSIS — Z01812 Encounter for preprocedural laboratory examination: Secondary | ICD-10-CM

## 2018-07-29 DIAGNOSIS — I4891 Unspecified atrial fibrillation: Secondary | ICD-10-CM

## 2018-07-29 NOTE — Patient Instructions (Signed)
Medication Instructions:  none If you need a refill on your cardiac medications before your next appointment, please call your pharmacy.   Lab work:TODAY CBC BMET If you have labs (blood work) drawn today and your tests are completely normal, you will receive your results only by: Marland Kitchen MyChart Message (if you have MyChart) OR . A paper copy in the mail If you have any lab test that is abnormal or we need to change your treatment, we will call you to review the results.  Testing/Procedures: Your physician has recommended that you have a Cardioversion (DCCV). Electrical Cardioversion uses a jolt of electricity to your heart either through paddles or wired patches attached to your chest. This is a controlled, usually prescheduled, procedure. Defibrillation is done under light anesthesia in the hospital, and you usually go home the day of the procedure. This is done to get your heart back into a normal rhythm. You are not awake for the procedure. Please see the instruction sheet given to you today.     Follow-Up:NONE Any Other Special Instructions Will Be Listed Below (If Applicable).   Dear  [facility] Nurse You are scheduled for a Cardioversion on 08/04/18 Thursday with Dr. Harrell Gave.  Please arrive at the [facility] (Main Entrance A) at [facility]: [address] [place], [place] [address] at 1:30 pm. (1 hour prior to procedure unless lab work is needed; if lab work is needed arrive 1.5 hours ahead)  DIET: Nothing to eat or drink after midnight except a sip of water with medications (see medication instructions below)  Medication Instructions: none   You must have a responsible person to drive you home and stay in the waiting area during your procedure. Failure to do so could result in cancellation.  Bring your insurance cards.  *Special Note: Every effort is made to have your procedure done on time. Occasionally there are emergencies that occur at the hospital that may cause  delays. Please be patient if a delay does occur.

## 2018-07-29 NOTE — Progress Notes (Signed)
1.) Reason for visit: A fib; Flecainide 50 mg Start 2/26:  EKG  2.) Name of MD requesting visit: Camnitz  3.) H&P: see chart  4.) ROS related to problem: A fib  5.) Assessment and plan per MD: EKG done, patient continues in Afib, Cardioversion scheduled 3/12.

## 2018-07-30 LAB — CBC
Hematocrit: 40.4 % (ref 34.0–46.6)
Hemoglobin: 13.4 g/dL (ref 11.1–15.9)
MCH: 29.3 pg (ref 26.6–33.0)
MCHC: 33.2 g/dL (ref 31.5–35.7)
MCV: 88 fL (ref 79–97)
RBC: 4.58 x10E6/uL (ref 3.77–5.28)
RDW: 13.5 % (ref 11.7–15.4)
WBC: 6.2 10*3/uL (ref 3.4–10.8)

## 2018-07-30 LAB — BASIC METABOLIC PANEL
BUN/Creatinine Ratio: 17 (ref 12–28)
BUN: 16 mg/dL (ref 8–27)
CO2: 23 mmol/L (ref 20–29)
Calcium: 9.3 mg/dL (ref 8.7–10.3)
Chloride: 102 mmol/L (ref 96–106)
Creatinine, Ser: 0.93 mg/dL (ref 0.57–1.00)
GFR calc Af Amer: 65 mL/min/{1.73_m2} (ref >59)
GFR calc non Af Amer: 57 mL/min/{1.73_m2} — ABNORMAL LOW (ref >59)
Glucose: 99 mg/dL (ref 65–99)
Potassium: 4 mmol/L (ref 3.5–5.2)
Sodium: 139 mmol/L (ref 134–144)

## 2018-08-01 ENCOUNTER — Telehealth: Payer: Self-pay | Admitting: Cardiology

## 2018-08-01 NOTE — Telephone Encounter (Signed)
Pt asking if she gets up and eats light breakfast by 5am would that be ok. Informed that she may eat light breakfast (pt states she will eat a yogurt) and nothing after 5:30 am. Pt agreeable to plan.

## 2018-08-01 NOTE — Telephone Encounter (Signed)
Patient is calling for prep for her cardioversion she is having done on 3/12.  She had dietary concerns.

## 2018-08-04 ENCOUNTER — Encounter (HOSPITAL_COMMUNITY): Payer: Self-pay | Admitting: Cardiology

## 2018-08-04 ENCOUNTER — Ambulatory Visit (HOSPITAL_COMMUNITY)
Admission: RE | Admit: 2018-08-04 | Discharge: 2018-08-04 | Disposition: A | Discharge status: Home / Self Care | Payer: Medicare Other | Attending: Cardiology | Admitting: Cardiology

## 2018-08-04 ENCOUNTER — Ambulatory Visit (HOSPITAL_COMMUNITY): Payer: Medicare Other | Admitting: Certified Registered"

## 2018-08-04 ENCOUNTER — Encounter (HOSPITAL_COMMUNITY)
Admission: RE | Disposition: A | Discharge status: Home / Self Care | Payer: Self-pay | Source: Home / Self Care | Attending: Cardiology

## 2018-08-04 ENCOUNTER — Other Ambulatory Visit: Payer: Self-pay

## 2018-08-04 DIAGNOSIS — E079 Disorder of thyroid, unspecified: Secondary | ICD-10-CM | POA: Diagnosis not present

## 2018-08-04 DIAGNOSIS — Z7901 Long term (current) use of anticoagulants: Secondary | ICD-10-CM | POA: Diagnosis not present

## 2018-08-04 DIAGNOSIS — Z85850 Personal history of malignant neoplasm of thyroid: Secondary | ICD-10-CM | POA: Diagnosis not present

## 2018-08-04 DIAGNOSIS — Z79890 Hormone replacement therapy (postmenopausal): Secondary | ICD-10-CM | POA: Diagnosis not present

## 2018-08-04 DIAGNOSIS — I509 Heart failure, unspecified: Secondary | ICD-10-CM | POA: Diagnosis not present

## 2018-08-04 DIAGNOSIS — M8580 Other specified disorders of bone density and structure, unspecified site: Secondary | ICD-10-CM | POA: Diagnosis not present

## 2018-08-04 DIAGNOSIS — Z79899 Other long term (current) drug therapy: Secondary | ICD-10-CM | POA: Insufficient documentation

## 2018-08-04 DIAGNOSIS — I10 Essential (primary) hypertension: Secondary | ICD-10-CM | POA: Insufficient documentation

## 2018-08-04 DIAGNOSIS — I4819 Other persistent atrial fibrillation: Secondary | ICD-10-CM | POA: Diagnosis not present

## 2018-08-04 DIAGNOSIS — I480 Paroxysmal atrial fibrillation: Secondary | ICD-10-CM

## 2018-08-04 DIAGNOSIS — Z853 Personal history of malignant neoplasm of breast: Secondary | ICD-10-CM | POA: Insufficient documentation

## 2018-08-04 DIAGNOSIS — I4891 Unspecified atrial fibrillation: Secondary | ICD-10-CM | POA: Diagnosis not present

## 2018-08-04 DIAGNOSIS — H409 Unspecified glaucoma: Secondary | ICD-10-CM | POA: Diagnosis not present

## 2018-08-04 DIAGNOSIS — I110 Hypertensive heart disease with heart failure: Secondary | ICD-10-CM | POA: Diagnosis not present

## 2018-08-04 HISTORY — PX: CARDIOVERSION: SHX1299

## 2018-08-04 SURGERY — CARDIOVERSION
Anesthesia: General

## 2018-08-04 MED ORDER — SODIUM CHLORIDE 0.9 % IV SOLN
INTRAVENOUS | Status: DC | PRN
  Administered 2018-08-04: 14:00:00 via INTRAVENOUS

## 2018-08-04 MED ORDER — LIDOCAINE 2% (20 MG/ML) 5 ML SYRINGE
INTRAMUSCULAR | Status: DC | PRN
  Administered 2018-08-04: 60 mg via INTRAVENOUS

## 2018-08-04 MED ORDER — PROPOFOL 10 MG/ML IV BOLUS
INTRAVENOUS | Status: DC | PRN
  Administered 2018-08-04: 50 mg via INTRAVENOUS

## 2018-08-04 NOTE — Anesthesia Preprocedure Evaluation (Signed)
Anesthesia Evaluation  Patient identified by MRN, date of birth, ID band Patient awake    Reviewed: Allergy & Precautions, H&P , NPO status , Patient's Chart, lab work & pertinent test results  Airway Mallampati: II   Neck ROM: full    Dental   Pulmonary neg pulmonary ROS,    breath sounds clear to auscultation       Cardiovascular hypertension, +CHF  + dysrhythmias Atrial Fibrillation  Rhythm:regular Rate:Normal     Neuro/Psych    GI/Hepatic   Endo/Other    Renal/GU      Musculoskeletal   Abdominal   Peds  Hematology   Anesthesia Other Findings   Reproductive/Obstetrics                             Anesthesia Physical Anesthesia Plan  ASA: III  Anesthesia Plan: General   Post-op Pain Management:    Induction: Intravenous  PONV Risk Score and Plan: 3 and Propofol infusion and Treatment may vary due to age or medical condition  Airway Management Planned: Mask  Additional Equipment:   Intra-op Plan:   Post-operative Plan:   Informed Consent: I have reviewed the patients History and Physical, chart, labs and discussed the procedure including the risks, benefits and alternatives for the proposed anesthesia with the patient or authorized representative who has indicated his/her understanding and acceptance.       Plan Discussed with: CRNA and Anesthesiologist  Anesthesia Plan Comments:         Anesthesia Quick Evaluation

## 2018-08-04 NOTE — CV Procedure (Signed)
Procedure:   DCCV  Indication:  Symptomatic atrial fibrillation  Procedure Note:  The patient signed informed consent.  She has had had therapeutic anticoagulation with apixaban greater than 3 weeks.  Anesthesia was administered by Dr. Marcie Bal.  Adequate airway was maintained throughout and vital followed per protocol.  She was cardioverted x 1 with 120J of biphasic synchronized energy.  She converted to NSR; unfortunately, post procedure she had >10 episodes of fluctuating between normal sinus rhythm in the 60s and atrial fibrillation in the 110s. There were no apparent complications.  The patient had normal neuro status and respiratory status post procedure with vitals stable as recorded elsewhere.    Follow up:  I will send a note to Dr. Curt Bears regarding her paroxysmal atrial fibrillation post procedure. I instructed her to continue all of her medications as ordered previously by Dr. Curt Bears.   Laura Dresser, MD PhD 08/04/2018 2:46 PM

## 2018-08-04 NOTE — Transfer of Care (Signed)
Immediate Anesthesia Transfer of Care Note  Patient: Laura Lee  Procedure(s) Performed: CARDIOVERSION (N/A )  Patient Location: Endoscopy Unit  Anesthesia Type:General  Level of Consciousness: drowsy  Airway & Oxygen Therapy: Patient Spontanous Breathing  Post-op Assessment: Report given to RN and Post -op Vital signs reviewed and stable  Post vital signs: Reviewed and stable  Last Vitals:  Vitals Value Taken Time  BP 96/70 08/04/2018  2:34 PM  Temp 36.4 C 08/04/2018  2:34 PM  Pulse 117 08/04/2018  2:34 PM  Resp 14 08/04/2018  2:34 PM  SpO2 95 % 08/04/2018  2:34 PM    Last Pain:  Vitals:   08/04/18 1434  TempSrc: Oral  PainSc:          Complications: No apparent anesthesia complications

## 2018-08-04 NOTE — Interval H&P Note (Signed)
History and Physical Interval Note:  08/04/2018 2:13 PM  Laura Lee  has presented today for surgery, with the diagnosis of a fib.  The various methods of treatment have been discussed with the patient and family. After consideration of risks, benefits and other options for treatment, the patient has consented to  Procedure(s): CARDIOVERSION (N/A) as a surgical intervention.  The patient's history has been reviewed, patient examined, no change in status, stable for surgery.  I have reviewed the patient's chart and labs.  Questions were answered to the patient's satisfaction.     Laura Lee

## 2018-08-04 NOTE — Discharge Instructions (Signed)
Electrical Cardioversion, Care After °This sheet gives you information about how to care for yourself after your procedure. Your health care provider may also give you more specific instructions. If you have problems or questions, contact your health care provider. °What can I expect after the procedure? °After the procedure, it is common to have: °· Some redness on the skin where the shocks were given. °Follow these instructions at home: ° °· Do not drive for 24 hours if you were given a medicine to help you relax (sedative). °· Take over-the-counter and prescription medicines only as told by your health care provider. °· Ask your health care provider how to check your pulse. Check it often. °· Rest for 48 hours after the procedure or as told by your health care provider. °· Avoid or limit your caffeine use as told by your health care provider. °Contact a health care provider if: °· You feel like your heart is beating too quickly or your pulse is not regular. °· You have a serious muscle cramp that does not go away. °Get help right away if: ° °· You have discomfort in your chest. °· You are dizzy or you feel faint. °· You have trouble breathing or you are short of breath. °· Your speech is slurred. °· You have trouble moving an arm or leg on one side of your body. °· Your fingers or toes turn cold or blue. °This information is not intended to replace advice given to you by your health care provider. Make sure you discuss any questions you have with your health care provider. °Document Released: 03/01/2013 Document Revised: 12/13/2015 Document Reviewed: 11/15/2015 °Elsevier Interactive Patient Education © 2019 [employer] ° °

## 2018-08-04 NOTE — Anesthesia Procedure Notes (Signed)
Procedure Name: General with mask airway Date/Time: 08/04/2018 2:34 PM Performed by: Imagene Riches, CRNA Pre-anesthesia Checklist: Patient identified, Emergency Drugs available, Suction available, Patient being monitored and Timeout performed Patient Re-evaluated:Patient Re-evaluated prior to induction Oxygen Delivery Method: Ambu bag Preoxygenation: Pre-oxygenation with 100% oxygen Induction Type: IV induction

## 2018-08-05 NOTE — Anesthesia Postprocedure Evaluation (Signed)
Anesthesia Post Note  Patient: Laura Lee  Procedure(s) Performed: CARDIOVERSION (N/A )     Patient location during evaluation: Endoscopy Anesthesia Type: General Level of consciousness: awake and alert Pain management: pain level controlled Vital Signs Assessment: post-procedure vital signs reviewed and stable Respiratory status: spontaneous breathing, nonlabored ventilation, respiratory function stable and patient connected to nasal cannula oxygen Cardiovascular status: blood pressure returned to baseline and stable Postop Assessment: no apparent nausea or vomiting Anesthetic complications: no    Last Vitals:  Vitals:   08/04/18 1450 08/04/18 1500  BP: (!) 96/56 (!) 117/92  Pulse: 76 (!) 114  Resp: 16 (!) 21  Temp:    SpO2: 96% 97%    Last Pain:  Vitals:   08/04/18 1500  TempSrc:   PainSc: 0-No pain                 Camdyn Beske S

## 2018-08-06 ENCOUNTER — Encounter (HOSPITAL_COMMUNITY): Payer: Self-pay | Admitting: Cardiology

## 2018-08-14 ENCOUNTER — Other Ambulatory Visit: Payer: Self-pay | Admitting: Cardiology

## 2018-08-16 ENCOUNTER — Telehealth (HOSPITAL_COMMUNITY): Payer: Self-pay | Admitting: *Deleted

## 2018-08-16 NOTE — Telephone Encounter (Signed)
Pt was hesitant but switched her follow up visit to a telephone business

## 2018-08-17 ENCOUNTER — Encounter (HOSPITAL_COMMUNITY): Payer: Self-pay

## 2018-08-17 ENCOUNTER — Other Ambulatory Visit (HOSPITAL_COMMUNITY): Payer: Self-pay | Admitting: *Deleted

## 2018-08-17 ENCOUNTER — Ambulatory Visit (HOSPITAL_COMMUNITY)
Admission: RE | Admit: 2018-08-17 | Discharge: 2018-08-17 | Disposition: A | Discharge status: Home / Self Care | Payer: Medicare Other | Source: Ambulatory Visit | Attending: Physician Assistant | Admitting: Physician Assistant

## 2018-08-17 ENCOUNTER — Other Ambulatory Visit: Payer: Self-pay

## 2018-08-17 VITALS — BP 127/76 | HR 76

## 2018-08-17 DIAGNOSIS — I4819 Other persistent atrial fibrillation: Secondary | ICD-10-CM | POA: Diagnosis not present

## 2018-08-17 MED ORDER — DILTIAZEM HCL ER COATED BEADS 120 MG PO CP24: 120.0000 mg | ORAL_CAPSULE | Freq: Every day | ORAL | 2 refills | Status: DC

## 2018-08-17 NOTE — Progress Notes (Signed)
Electrophysiology TeleHealth Note   Due to national recommendations of social distancing due to [place] 19, Audio telehealth visit is felt to be most appropriate for this patient at this time.  See consent below from today for patient consent regarding telehealth for the Atrial Fibrillation Clinic. Consent obtained verbally.   Date:  08/17/2018   ID:  Laura Lee, DOB 03-May-1932, MRN [id number]  Location: home  Provider location: [address] [place], [place] [address] Evaluation Performed: Follow up  PCP:  [name], MD  Primary Electrophysiologist: Dr Curt Bears   CC: Follow up for atrial fibrillation   History of Present Illness: Laura Lee is a 85 y.o. female who presents via audio conferencing for a telehealth visit today. She reports that since her DCCV her fatigue has improved. She also reports that her symptoms of heart racing and palpitations have resolved. Her main concern today is her varicose veins which are tender at times. Compression stockings have helped.  Today, she denies symptoms of palpitations, chest pain, shortness of breath, orthopnea, PND claudication, dizziness, presyncope, syncope, bleeding, or neurologic sequela. The patient is tolerating medications without difficulties and is otherwise without complaint today.   she denies symptoms of cough, fevers, chills, or new SOB worrisome for COVID 19.     Atrial Fibrillation Risk Factors:  she does not have symptoms or diagnosis of sleep apnea. she does not have a history of rheumatic fever. she does not have a history of alcohol use. The patient does not have a history of early familial atrial fibrillation or other arrhythmias.  she has a BMI of There is no height or weight on file to calculate BMI.. There were no vitals filed for this visit.  Past Medical History:  Diagnosis Date  . Atrial fibrillation ([place])   . Cancer (HCC)    Breast and thyroid cancer  . Glaucoma   . Hearing impaired    Hearing aids  . Hypertension   . Osteopenia 05/2013   T score -1.9 FRAX 15%/4.3%  . Thyroid disease    Cancer   Past Surgical History:  Procedure Laterality Date  . APPENDECTOMY    . BREAST EXCISIONAL BIOPSY Right    benign  . BREAST LUMPECTOMY     [id number] radiation  . BREAST SURGERY  1989   Lumpectomy right breast  . CARDIOVERSION N/A 02/14/2016   Procedure: CARDIOVERSION;  Surgeon: Sueanne Margarita, MD;  Location: [facility];  Service: Cardiovascular;  Laterality: N/A;  . CARDIOVERSION N/A 08/04/2018   Procedure: CARDIOVERSION;  Surgeon: Buford Dresser, MD;  Location: [facility] ENDOSCOPY;  Service: Cardiovascular;  Laterality: N/A;  . Mole excised     Benign  . THYROID SURGERY     Removed 1 lobe  . TONSILLECTOMY       Current Outpatient Medications  Medication Sig Dispense Refill  . Calcium Citrate-Vitamin D (CALCIUM CITRATE + D3 PO) Take 1 tablet by mouth 2 (two) times daily.    . carboxymethylcellulose (REFRESH PLUS) 0.5 % SOLN Place 1 drop into both eyes 3 (three) times daily as needed (dry/irritated eyes.).    Marland Kitchen CARTIA XT 180 MG 24 hr capsule Take 1 capsule by mouth once daily 30 capsule 10  . Cholecalciferol (VITAMIN D PO) Take 1 tablet by mouth daily.     Marland Kitchen ELIQUIS 5 MG TABS tablet Take 5 mg by mouth 2 (two) times daily.    Marland Kitchen ENTRESTO 24-26 MG TAKE 1 TABLET BY MOUTH TWICE DAILY (Patient taking differently:  Take 1 tablet by mouth 2 (two) times daily. ) 180 tablet 2  . flecainide (TAMBOCOR) 50 MG tablet Take 1 tablet (50 mg total) by mouth 2 (two) times daily. 60 tablet 3  . latanoprost (XALATAN) 0.005 % ophthalmic solution Place 1 drop into the left eye at bedtime.     Marland Kitchen levothyroxine (SYNTHROID, LEVOTHROID) 88 MCG tablet Take 88 mcg by mouth daily before breakfast.    . meloxicam (MOBIC) 7.5 MG tablet Take 7.5 mg by mouth daily as needed for pain.  1   No current facility-administered medications for this encounter.     Allergies:   Combigan [brimonidine  tartrate-timolol] and Pneumococcal vaccines   Social History:  The patient  reports that she has never smoked. She has never used smokeless tobacco. She reports current alcohol use. She reports that she does not use drugs.   Family History:  The patient's family history includes Breast cancer (age of onset: 18) in her daughter; Breast cancer (age of onset: 44) in her mother; Heart attack in her brother; Lung cancer in her sister.    ROS:  Please see the history of present illness.   All other systems are personally reviewed and negative.   Recent Labs: 07/29/2018: BUN 16; Creatinine, Ser 0.93; Hemoglobin 13.4; Platelets CANCELED; Potassium 4.0; Sodium 139  personally reviewed    Other studies personally reviewed: Additional studies/ records that were reviewed today include: Epic notes.   ASSESSMENT AND PLAN:  1. Persistent atrial fibrillation S/p DCCV 08/04/18 which was successful but she was noted to have paroxysms of brief afib on telemetry immediately after the procedure. She reports that she has improved symptomatically with less fatigue and no palpitations. Continue flecainide 50 mg BID Decrease diltiazem to 120 mg daily to help with leg swelling. Continue Eliquis 5 mg BID. Reports no missed doses of anticoagulation.   This patients CHA2DS2-VASc Score and unadjusted Ischemic Stroke Rate (% per year) is equal to 7.2 % stroke rate/year from a score of 5  Above score calculated as 1 point each if present [CHF, HTN, DM, Vascular=MI/PAD/Aortic Plaque, Age if 65-74, or Female] Above score calculated as 2 points each if present [Age > 75, or Stroke/TIA/TE]  2. NICM Normalized LV function. On Entresto. Could not tolerate BB in the past. No symptoms of fluid overload.  3. HTN No changes today.  COVID screen The patient does not have any symptoms that suggest any further testing/ screening at this time.  Social distancing reinforced today.   Follow-up: Dr Curt Bears as scheduled.    Current medicines are reviewed at length with the patient today.   The patient does not have concerns regarding her medicines.  The following changes were made today:  Decrease diltiazem  Labs/ tests ordered today include:  No orders of the defined types were placed in this encounter.   Patient Risk:  after full review of this patients clinical status, I feel that they are at moderate risk at this time.   Today, I have spent 14 minutes with the patient with telehealth technology discussing atrial fibrillation and her medications.    Gwenlyn Perking PA-C 08/17/2018 1:50 PM  [facility] [address] [place], [place] [address] [phone]   I hereby voluntarily request, consent and authorize the [facility] and its employed or contracted physicians, physician assistants, nurse practitioners or other licensed health care professionals (the Practitioner), to provide me with telemedicine health care services (the "Services") as deemed necessary by  the treating Practitioner. I acknowledge and consent to receive the Services by the Practitioner via telemedicine. I understand that the telemedicine visit will involve communicating with the Practitioner through live audiovisual communication technology and the disclosure of certain medical information by electronic transmission. I acknowledge that I have been given the opportunity to request an in-person assessment or other available alternative prior to the telemedicine visit and am voluntarily participating in the telemedicine visit.   I understand that I have the right to withhold or withdraw my consent to the use of telemedicine in the course of my care at any time, without affecting my right to future care or treatment, and that the Practitioner or I may terminate the telemedicine visit at any time. I understand that I have the right to inspect all information obtained and/or recorded in the course of  the telemedicine visit and may receive copies of available information for a reasonable fee.  I understand that some of the potential risks of receiving the Services via telemedicine include:   Delay or interruption in medical evaluation due to technological equipment failure or disruption;  Information transmitted may not be sufficient (e.g. poor resolution of images) to allow for appropriate medical decision making by the Practitioner; and/or  In rare instances, security protocols could fail, causing a breach of personal health information.   Furthermore, I acknowledge that it is my responsibility to provide information about my medical history, conditions and care that is complete and accurate to the best of my ability. I acknowledge that Practitioner's advice, recommendations, and/or decision may be based on factors not within their control, such as incomplete or inaccurate data provided by me or distortions of diagnostic images or specimens that may result from electronic transmissions. I understand that the practice of medicine is not an exact science and that Practitioner makes no warranties or guarantees regarding treatment outcomes. I acknowledge that I will receive a copy of this consent concurrently upon execution via email to the email address I last provided but may also request a printed copy by calling the office of the [facility].  I understand that my insurance will be billed for this visit.   I have read or had this consent read to me.  I understand the contents of this consent, which adequately explains the benefits and risks of the Services being provided via telemedicine.  I have been provided ample opportunity to ask questions regarding this consent and the Services and have had my questions answered to my satisfaction.  I give my informed consent for the services to be provided through the use of telemedicine in my medical care  By participating in this  telemedicine visit I agree to the above.

## 2018-08-18 NOTE — Addendum Note (Signed)
Encounter addended by: Oliver Barre, PA on: 08/18/2018 8:34 AM  Actions taken: Charge Capture section accepted

## 2018-10-13 ENCOUNTER — Telehealth: Payer: Self-pay | Admitting: *Deleted

## 2018-10-13 NOTE — Telephone Encounter (Signed)
Calling patient today to discuss upcoming appointment.  We are currently trying to limit exposure to the virus that causes COVID-19 by seeing patients at home rather than in the office. We would like to schedule this appointment as a Psychologist, counselling. Unable to reach patient.  Phone rings then hangs up.

## 2018-10-18 ENCOUNTER — Other Ambulatory Visit: Payer: Self-pay

## 2018-10-18 ENCOUNTER — Telehealth (INDEPENDENT_AMBULATORY_CARE_PROVIDER_SITE_OTHER): Payer: Medicare Other | Admitting: Cardiology

## 2018-10-18 DIAGNOSIS — I4819 Other persistent atrial fibrillation: Secondary | ICD-10-CM | POA: Diagnosis not present

## 2018-10-18 NOTE — Progress Notes (Signed)
Electrophysiology TeleHealth Note   Due to national recommendations of social distancing due to COVID 19, an audio/video telehealth visit is felt to be most appropriate for this patient at this time.  See Epic message for the patient's consent to telehealth for [facility].   Date:  10/18/2018   ID:  IVAH Lee, DOB [birth year], MRN [id number]  Location: patient's home  Provider location: [address] [place]  Evaluation Performed: Follow-up visit  PCP:  Laura Manes, MD  Cardiologist:  Laura Chick Meredith Leeds, MD  Electrophysiologist:  Dr Laura Lee  Chief Complaint:  AF  History of Present Illness:    Laura Lee is a 85 y.o. female who presents via audio/video conferencing for a telehealth visit today.  Since last being seen in our clinic, the patient reports doing very well.  Today, she denies symptoms of palpitations, chest pain, shortness of breath,  lower extremity edema, dizziness, presyncope, or syncope.  The patient is otherwise without complaint today.  The patient denies symptoms of fevers, chills, cough, or new SOB worrisome for COVID 19.  She has a history of atrial fibrillation.  She was noted to be in persistent atrial fibrillation and had a cardioversion 08/04/2018.  She is wearing compression stockings for her varicose veins.  Today, denies symptoms of palpitations, chest pain, shortness of breath, orthopnea, PND, lower extremity edema, claudication, dizziness, presyncope, syncope, bleeding, or neurologic sequela. The patient is tolerating medications without difficulties.  She has multiple complaints.  That being said it does sound like she is remained in sinus rhythm since her cardioversion.  Her complaints center around her varicose veins.  She is wearing compression stockings.  She feels that she is having some sort of nerve compression in her leg as well.  I have asked her to speak to her primary physician about this as I have not examined her  varicose veins.  Past Medical History:  Diagnosis Date  . Atrial fibrillation ([place])   . Cancer ([facility])    Breast and thyroid cancer  . Glaucoma   . Hearing impaired    Hearing aids  . Hypertension   . Osteopenia 05/2013   T score -1.9 FRAX 15%/4.3%  . Thyroid disease    Cancer    Past Surgical History:  Procedure Laterality Date  . APPENDECTOMY    . BREAST EXCISIONAL BIOPSY Right    benign  . BREAST LUMPECTOMY     [id number] radiation  . BREAST SURGERY  1989   Lumpectomy right breast  . CARDIOVERSION N/A 02/14/2016   Procedure: CARDIOVERSION;  Surgeon: Laura Margarita, MD;  Location: [facility] ENDOSCOPY;  Service: Cardiovascular;  Laterality: N/A;  . CARDIOVERSION N/A 08/04/2018   Procedure: CARDIOVERSION;  Surgeon: Laura Dresser, MD;  Location: [facility] ENDOSCOPY;  Service: Cardiovascular;  Laterality: N/A;  . Mole excised     Benign  . THYROID SURGERY     Removed 1 lobe  . TONSILLECTOMY      Current Outpatient Medications  Medication Sig Dispense Refill  . Calcium Citrate-Vitamin D (CALCIUM CITRATE + D3 PO) Take 1 tablet by mouth 2 (two) times daily.    . carboxymethylcellulose (REFRESH PLUS) 0.5 % SOLN Place 1 drop into both eyes 3 (three) times daily as needed (dry/irritated eyes.).    Marland Kitchen Cholecalciferol (VITAMIN D PO) Take 1 tablet by mouth daily.     Marland Kitchen diltiazem (CARDIZEM CD) 120 MG 24 hr capsule Take 1 capsule (120 mg total) by mouth daily. (Patient taking  differently: Take 180 mg by mouth daily. ) 90 capsule 2  . ELIQUIS 5 MG TABS tablet Take 5 mg by mouth 2 (two) times daily.    Marland Kitchen ENTRESTO 24-26 MG TAKE 1 TABLET BY MOUTH TWICE DAILY (Patient taking differently: Take 1 tablet by mouth 2 (two) times daily. ) 180 tablet 2  . flecainide (TAMBOCOR) 50 MG tablet Take 1 tablet (50 mg total) by mouth 2 (two) times daily. 60 tablet 3  . latanoprost (XALATAN) 0.005 % ophthalmic solution Place 1 drop into the left eye at bedtime.     Marland Kitchen levothyroxine (SYNTHROID, LEVOTHROID) 88 MCG  tablet Take 88 mcg by mouth daily before breakfast.    . meloxicam (MOBIC) 7.5 MG tablet Take 7.5 mg by mouth daily as needed for pain.  1   No current facility-administered medications for this visit.     Allergies:   Combigan [brimonidine tartrate-timolol] and Pneumococcal vaccines   Social History:  The patient  reports that she has never smoked. She has never used smokeless tobacco. She reports current alcohol use. She reports that she does not use drugs.   Family History:  The patient's  family history includes Breast cancer (age of onset: 36) in her daughter; Breast cancer (age of onset: 15) in her mother; Heart attack in her brother; Lung cancer in her sister.   ROS:  Please see the history of present illness.   All other systems are personally reviewed and negative.    Exam:    Vital Signs:  BP 125/76   Pulse 76   Other the phone, no acute distress, no shortness of breath.  Labs/Other Tests and Data Reviewed:    Recent Labs: 07/29/2018: BUN 16; Creatinine, Ser 0.93; Hemoglobin 13.4; Platelets CANCELED; Potassium 4.0; Sodium 139   Wt Readings from Last 3 Encounters:  08/04/18 147 lb (66.7 kg)  07/29/18 148 lb (67.1 kg)  07/19/18 143 lb (64.9 kg)     Other studies personally reviewed: Additional studies/ records that were reviewed today include: 08/04/18  Review of the above records today demonstrates:  SR and AF  ASSESSMENT & PLAN:    1.  Persistent atrial fibrillation: Status post cardioversion 08/04/2018.  Currently on flecainide, diltiazem, and Eliquis.  Fortunately she remains in sinus rhythm.  No changes.  This patients CHA2DS2-VASc Score and unadjusted Ischemic Stroke Rate (% per year) is equal to 11.2 % stroke rate/year from a score of 7  Above score calculated as 1 point each if present [CHF, HTN, DM, Vascular=MI/PAD/Aortic Plaque, Age if 65-74, or Female] Above score calculated as 2 points each if present [Age > 75, or Stroke/TIA/TE]   2.  Nonischemic  cardiomyopathy: Normalized LV function.  Currently on Entresto.  Unable to tolerate beta-blockers in the past.  3.  Hypertension: Currently well controlled   COVID 19 screen The patient denies symptoms of COVID 19 at this time.  The importance of social distancing was discussed today.  Follow-up: 6 months  Current medicines are reviewed at length with the patient today.   The patient does not have concerns regarding her medicines.  The following changes were made today:  none  Labs/ tests ordered today include:  No orders of the defined types were placed in this encounter.    Patient Risk:  after full review of this patients clinical status, I feel that they are at moderate risk at this time.  Today, I have spent 11 minutes with the patient with telehealth technology discussing atrial fibrillation.  Signed, Adlai Sinning Meredith Leeds, MD  10/18/2018 2:48 PM     [facility] [address] [place] [address] [phone] (office) [phone] (fax)

## 2018-10-18 NOTE — Telephone Encounter (Signed)

## 2018-11-06 ENCOUNTER — Other Ambulatory Visit: Payer: Self-pay | Admitting: Cardiology

## 2018-12-29 DIAGNOSIS — H409 Unspecified glaucoma: Secondary | ICD-10-CM | POA: Diagnosis not present

## 2018-12-29 DIAGNOSIS — M170 Bilateral primary osteoarthritis of knee: Secondary | ICD-10-CM | POA: Diagnosis not present

## 2018-12-29 DIAGNOSIS — I5020 Unspecified systolic (congestive) heart failure: Secondary | ICD-10-CM | POA: Diagnosis not present

## 2018-12-29 DIAGNOSIS — E039 Hypothyroidism, unspecified: Secondary | ICD-10-CM | POA: Diagnosis not present

## 2018-12-29 DIAGNOSIS — I480 Paroxysmal atrial fibrillation: Secondary | ICD-10-CM | POA: Diagnosis not present

## 2018-12-29 DIAGNOSIS — I10 Essential (primary) hypertension: Secondary | ICD-10-CM | POA: Diagnosis not present

## 2019-02-10 DIAGNOSIS — I10 Essential (primary) hypertension: Secondary | ICD-10-CM | POA: Diagnosis not present

## 2019-02-10 DIAGNOSIS — I480 Paroxysmal atrial fibrillation: Secondary | ICD-10-CM | POA: Diagnosis not present

## 2019-02-10 DIAGNOSIS — E039 Hypothyroidism, unspecified: Secondary | ICD-10-CM | POA: Diagnosis not present

## 2019-02-10 DIAGNOSIS — Z79899 Other long term (current) drug therapy: Secondary | ICD-10-CM | POA: Diagnosis not present

## 2019-02-10 DIAGNOSIS — M170 Bilateral primary osteoarthritis of knee: Secondary | ICD-10-CM | POA: Diagnosis not present

## 2019-02-10 DIAGNOSIS — H409 Unspecified glaucoma: Secondary | ICD-10-CM | POA: Diagnosis not present

## 2019-02-10 DIAGNOSIS — D6869 Other thrombophilia: Secondary | ICD-10-CM | POA: Diagnosis not present

## 2019-02-10 DIAGNOSIS — Z1389 Encounter for screening for other disorder: Secondary | ICD-10-CM | POA: Diagnosis not present

## 2019-02-10 DIAGNOSIS — I5020 Unspecified systolic (congestive) heart failure: Secondary | ICD-10-CM | POA: Diagnosis not present

## 2019-03-02 ENCOUNTER — Encounter: Payer: Self-pay | Admitting: Gynecology

## 2019-03-07 ENCOUNTER — Other Ambulatory Visit: Payer: Self-pay | Admitting: Cardiology

## 2019-04-14 ENCOUNTER — Telehealth: Payer: Self-pay | Admitting: Cardiology

## 2019-04-14 NOTE — Telephone Encounter (Signed)
Son aware it is ok for him to accompany mom to appt next week. He appreciates our understanding.

## 2019-04-14 NOTE — Telephone Encounter (Signed)
New Message  Pt's son called to see if he can accompany mother on appt on 04/18/19. States that she has difficulty hearing and needs assistance   Please call to discuss

## 2019-04-18 ENCOUNTER — Ambulatory Visit (INDEPENDENT_AMBULATORY_CARE_PROVIDER_SITE_OTHER): Payer: Medicare Other | Admitting: Cardiology

## 2019-04-18 ENCOUNTER — Other Ambulatory Visit: Payer: Self-pay

## 2019-04-18 ENCOUNTER — Encounter: Payer: Self-pay | Admitting: Cardiology

## 2019-04-18 VITALS — BP 138/82 | HR 67 | Ht 66.0 in | Wt 145.8 lb

## 2019-04-18 DIAGNOSIS — I4819 Other persistent atrial fibrillation: Secondary | ICD-10-CM | POA: Diagnosis not present

## 2019-04-18 NOTE — Progress Notes (Signed)
Electrophysiology Office Note   Date:  04/18/2019   ID:  DELASIA Lee, DOB 1931-10-17, MRN [id number]  PCP:  [name], MD  Primary Electrophysiologist:  Dr Curt Bears  CC: Follow up for atrial fibrillation   History of Present Illness: Laura Lee is a 85 y.o. female who presents today for electrophysiology evaluation.   She was found to be in new onset atrial fibrillation with rapid ventricular response. Had cardioversion 02/14/16 and was started on amiodarone.  Amiodarone has since been stopped for fatigue.  She has also tried Coreg, Toprol, and now diltiazem to help with her symptoms of fatigue. She admits that she feels more fatigued with increased palpitations during times of stress (ie preparing meals for the holidays).  She does not have shortness of breath.  She has not had any chest pain. She states that she has had some nose bleeds lately but each episode resolved quickly.  She was put on flecainide and is now status post cardioversion on 08/04/2018.  Today, denies symptoms of palpitations, chest pain, shortness of breath, orthopnea, PND, lower extremity edema, claudication, dizziness, presyncope, syncope, bleeding, or neurologic sequela. The patient is tolerating medications without difficulties.  Overall she is doing well.  Unfortunately she is having some nerve pain.  Thanks that the nerve pain is due to her compression stockings.  I have encouraged her to discuss this with her primary physician as she may need medications or referral to neurology.  Aside from that she is doing well without complaint.  Her fatigue has improved since of being in sinus rhythm.   Past Medical History:  Diagnosis Date  . Atrial fibrillation ([place]aura Lee)   . Cancer (H[facility])    Breast and thyroid cancer  . Glaucoma   . Hearing impaired    Hearing aids  . Hypertension   . Osteopenia 05/2013   T score -1.9 FRAX 15%/4.3%  . Thyroid disease    Cancer   Past Surgical History:  Procedure Laterality  Date  . APPENDECTOMY    . BREAST EXCISIONAL BIOPSY Right    benign  . BREAST LUMPECTOMY     [id number] radiation  . BREAST SURGERY  1989   Lumpectomy right breast  . CARDIOVERSION N/A 02/14/2016   Procedure: CARDIOVERSION;  Surgeon: Sueanne Margarita, MD;  Location: [facility] ENDOSCOPY;  Service: Cardiovascular;  Laterality: N/A;  . CARDIOVERSION N/A 08/04/2018   Procedure: CARDIOVERSION;  Surgeon: Buford Dresser, MD;  Location: [facility] ENDOSCOPY;  Service: Cardiovascular;  Laterality: N/A;  . Mole excised     Benign  . THYROID SURGERY     Removed 1 lobe  . TONSILLECTOMY       Current Outpatient Medications  Medication Sig Dispense Refill  . Calcium Citrate-Vitamin D (CALCIUM CITRATE + D3 PO) Take 1 tablet by mouth 2 (two) times daily.    . carboxymethylcellulose (REFRESH PLUS) 0.5 % SOLN Place 1 drop into both eyes 3 (three) times daily as needed (dry/irritated eyes.).    Marland Kitchen CARTIA XT 180 MG 24 hr capsule Take 180 mg by mouth daily.    . Cholecalciferol (VITAMIN D PO) Take 1 tablet by mouth daily.     Marland Kitchen ELIQUIS 5 MG TABS tablet Take 5 mg by mouth 2 (two) times daily.    Marland Kitchen ENTRESTO 24-26 MG Take 1 tablet by mouth twice daily 180 tablet 0  . flecainide (TAMBOCOR) 50 MG tablet Take 1 tablet by mouth twice daily 60 tablet 5  . latanoprost (XALATAN) 0.005 % ophthalmic solution  Place 1 drop into the left eye at bedtime.     Marland Kitchen levothyroxine (SYNTHROID, LEVOTHROID) 88 MCG tablet Take 88 mcg by mouth daily before breakfast.    . meloxicam (MOBIC) 7.5 MG tablet Take 7.5 mg by mouth daily as needed for pain.  1   No current facility-administered medications for this visit.     Allergies:   Combigan [brimonidine tartrate-timolol] and Pneumococcal vaccines   Social History:  The patient  reports that she has never smoked. She has never used smokeless tobacco. She reports current alcohol use. She reports that she does not use drugs.   Family History:  The patient's family history includes Breast  cancer (age of onset: 92) in her daughter; Breast cancer (age of onset: 4) in her mother; Heart attack in her brother; Lung cancer in her sister.    ROS:  Please see the history of present illness.   Otherwise, review of systems is positive for none.   All other systems are reviewed and negative.   PHYSICAL EXAM: VS:  BP 138/82   Pulse 67   Ht 5\' 6"  (1.676 m)   Wt 145 lb 12.8 oz (66.1 kg)   SpO2 99%   BMI 23.53 kg/m  , BMI Body mass index is 23.53 kg/m. GEN: Well nourished, well developed, in no acute distress  HEENT: normal  Neck: no JVD, carotid bruits, or masses Cardiac: RRR; no murmurs, rubs, or gallops,no edema  Respiratory:  clear to auscultation bilaterally, normal work of breathing GI: soft, nontender, nondistended, + BS MS: no deformity or atrophy  Skin: warm and dry Neuro:  Strength and sensation are intact Psych: euthymic mood, full affect  EKG:  EKG is ordered today. Personal review of the ekg ordered shows sinus rhythm, rate 67, IVCD, first-degree AV block   Wt Readings from Last 3 Encounters:  04/18/19 145 lb 12.8 oz (66.1 kg)  08/04/18 147 lb (66.7 kg)  07/29/18 148 lb (67.1 kg)      Other studies Reviewed: Additional studies/ records that were reviewed today include: TTE 06/08/16  Review of the above records today demonstrates:  - Left ventricle: Wall thickness was increased in a pattern of mild   LVH. The estimated ejection fraction was 55%. Doppler parameters   are consistent with both elevated ventricular end-diastolic   filling pressure and elevated left atrial filling pressure. - Aortic valve: There was mild regurgitation. - Mitral valve: There was mild regurgitation. - Left atrium: The atrium was moderately dilated. - Atrial septum: No defect or patent foramen ovale was identified.  ASSESSMENT AND PLAN:  1.  Persistent atrial fibrillation: Currently on Eliquis, flecainide, diltiazem.  Status post cardioversion 08/04/2018.  Remains in sinus  rhythm.  This patients CHA2DS2-VASc Score and unadjusted Ischemic Stroke Rate (% per year) is equal to 7.2 % stroke rate/year from a score of 5  Above score calculated as 1 point each if present [CHF, HTN, DM, Vascular=MI/PAD/Aortic Plaque, Age if 65-74, or Female] Above score calculated as 2 points each if present [Age > 75, or Stroke/TIA/TE]   2. Likely nonischemic cardiomyopathy: Currently on Entresto.  Did not tolerate beta-blockers.  Ejection fraction is improved to normal.  3. Fatigue: Has improved since being in sinus rhythm.  No changes.   Current medicines are reviewed at length with the patient today.   The patient does not have concerns regarding her medicines.  The following changes were made today: None  Labs/ tests ordered today include:  Orders Placed This Encounter  Procedures  . EKG 12-Lead     Disposition:   FU with Arelys Glassco in 6 months   Camaria Gerald M. Latise Dilley MD 04/18/2019 11:06 AM

## 2019-05-06 ENCOUNTER — Other Ambulatory Visit: Payer: Self-pay | Admitting: Cardiology

## 2019-05-29 DIAGNOSIS — Z23 Encounter for immunization: Secondary | ICD-10-CM | POA: Diagnosis not present

## 2019-06-03 ENCOUNTER — Other Ambulatory Visit: Payer: Self-pay | Admitting: Cardiology

## 2019-06-07 DIAGNOSIS — I10 Essential (primary) hypertension: Secondary | ICD-10-CM | POA: Diagnosis not present

## 2019-06-07 DIAGNOSIS — H409 Unspecified glaucoma: Secondary | ICD-10-CM | POA: Diagnosis not present

## 2019-06-07 DIAGNOSIS — I5020 Unspecified systolic (congestive) heart failure: Secondary | ICD-10-CM | POA: Diagnosis not present

## 2019-06-07 DIAGNOSIS — E039 Hypothyroidism, unspecified: Secondary | ICD-10-CM | POA: Diagnosis not present

## 2019-06-07 DIAGNOSIS — M170 Bilateral primary osteoarthritis of knee: Secondary | ICD-10-CM | POA: Diagnosis not present

## 2019-06-07 DIAGNOSIS — I480 Paroxysmal atrial fibrillation: Secondary | ICD-10-CM | POA: Diagnosis not present

## 2019-06-26 DIAGNOSIS — Z23 Encounter for immunization: Secondary | ICD-10-CM | POA: Diagnosis not present

## 2019-07-06 DIAGNOSIS — H35359 Cystoid macular degeneration, unspecified eye: Secondary | ICD-10-CM | POA: Diagnosis not present

## 2019-07-17 DIAGNOSIS — H25811 Combined forms of age-related cataract, right eye: Secondary | ICD-10-CM | POA: Diagnosis not present

## 2019-07-17 DIAGNOSIS — Z01818 Encounter for other preprocedural examination: Secondary | ICD-10-CM | POA: Diagnosis not present

## 2019-07-17 DIAGNOSIS — H25812 Combined forms of age-related cataract, left eye: Secondary | ICD-10-CM | POA: Diagnosis not present

## 2019-07-17 DIAGNOSIS — H353130 Nonexudative age-related macular degeneration, bilateral, stage unspecified: Secondary | ICD-10-CM | POA: Diagnosis not present

## 2019-08-04 DIAGNOSIS — H25812 Combined forms of age-related cataract, left eye: Secondary | ICD-10-CM | POA: Diagnosis not present

## 2019-08-04 DIAGNOSIS — H2512 Age-related nuclear cataract, left eye: Secondary | ICD-10-CM | POA: Diagnosis not present

## 2019-08-07 ENCOUNTER — Other Ambulatory Visit: Payer: Self-pay | Admitting: Cardiology

## 2019-08-16 DIAGNOSIS — I10 Essential (primary) hypertension: Secondary | ICD-10-CM | POA: Diagnosis not present

## 2019-08-16 DIAGNOSIS — I5020 Unspecified systolic (congestive) heart failure: Secondary | ICD-10-CM | POA: Diagnosis not present

## 2019-08-16 DIAGNOSIS — H409 Unspecified glaucoma: Secondary | ICD-10-CM | POA: Diagnosis not present

## 2019-08-16 DIAGNOSIS — Z79899 Other long term (current) drug therapy: Secondary | ICD-10-CM | POA: Diagnosis not present

## 2019-08-16 DIAGNOSIS — R5383 Other fatigue: Secondary | ICD-10-CM | POA: Diagnosis not present

## 2019-08-16 DIAGNOSIS — M5430 Sciatica, unspecified side: Secondary | ICD-10-CM | POA: Diagnosis not present

## 2019-08-16 DIAGNOSIS — I480 Paroxysmal atrial fibrillation: Secondary | ICD-10-CM | POA: Diagnosis not present

## 2019-08-16 DIAGNOSIS — M170 Bilateral primary osteoarthritis of knee: Secondary | ICD-10-CM | POA: Diagnosis not present

## 2019-08-16 DIAGNOSIS — E039 Hypothyroidism, unspecified: Secondary | ICD-10-CM | POA: Diagnosis not present

## 2019-08-16 DIAGNOSIS — D696 Thrombocytopenia, unspecified: Secondary | ICD-10-CM | POA: Diagnosis not present

## 2019-08-31 DIAGNOSIS — H2513 Age-related nuclear cataract, bilateral: Secondary | ICD-10-CM | POA: Diagnosis not present

## 2019-09-01 DIAGNOSIS — H25811 Combined forms of age-related cataract, right eye: Secondary | ICD-10-CM | POA: Diagnosis not present

## 2019-09-01 DIAGNOSIS — H2511 Age-related nuclear cataract, right eye: Secondary | ICD-10-CM | POA: Diagnosis not present

## 2019-09-13 DIAGNOSIS — I5020 Unspecified systolic (congestive) heart failure: Secondary | ICD-10-CM | POA: Diagnosis not present

## 2019-09-13 DIAGNOSIS — E039 Hypothyroidism, unspecified: Secondary | ICD-10-CM | POA: Diagnosis not present

## 2019-09-13 DIAGNOSIS — H409 Unspecified glaucoma: Secondary | ICD-10-CM | POA: Diagnosis not present

## 2019-09-13 DIAGNOSIS — M170 Bilateral primary osteoarthritis of knee: Secondary | ICD-10-CM | POA: Diagnosis not present

## 2019-09-13 DIAGNOSIS — I10 Essential (primary) hypertension: Secondary | ICD-10-CM | POA: Diagnosis not present

## 2019-09-13 DIAGNOSIS — I480 Paroxysmal atrial fibrillation: Secondary | ICD-10-CM | POA: Diagnosis not present

## 2019-10-26 ENCOUNTER — Emergency Department (HOSPITAL_BASED_OUTPATIENT_CLINIC_OR_DEPARTMENT_OTHER): Payer: No Typology Code available for payment source

## 2019-10-26 ENCOUNTER — Other Ambulatory Visit: Payer: Self-pay

## 2019-10-26 ENCOUNTER — Inpatient Hospital Stay (HOSPITAL_BASED_OUTPATIENT_CLINIC_OR_DEPARTMENT_OTHER)
Admission: EM | Admit: 2019-10-26 | Discharge: 2019-11-04 | DRG: 552 | Disposition: A | Discharge status: Skilled Nursing Facility | Payer: No Typology Code available for payment source | Attending: Internal Medicine | Admitting: Internal Medicine

## 2019-10-26 ENCOUNTER — Encounter (HOSPITAL_BASED_OUTPATIENT_CLINIC_OR_DEPARTMENT_OTHER): Payer: Self-pay | Admitting: *Deleted

## 2019-10-26 DIAGNOSIS — S12120A Other displaced dens fracture, initial encounter for closed fracture: Secondary | ICD-10-CM | POA: Diagnosis not present

## 2019-10-26 DIAGNOSIS — M488X4 Other specified spondylopathies, thoracic region: Secondary | ICD-10-CM | POA: Diagnosis present

## 2019-10-26 DIAGNOSIS — H9190 Unspecified hearing loss, unspecified ear: Secondary | ICD-10-CM | POA: Diagnosis present

## 2019-10-26 DIAGNOSIS — I110 Hypertensive heart disease with heart failure: Secondary | ICD-10-CM | POA: Diagnosis present

## 2019-10-26 DIAGNOSIS — Z8249 Family history of ischemic heart disease and other diseases of the circulatory system: Secondary | ICD-10-CM

## 2019-10-26 DIAGNOSIS — Z923 Personal history of irradiation: Secondary | ICD-10-CM

## 2019-10-26 DIAGNOSIS — Z981 Arthrodesis status: Secondary | ICD-10-CM

## 2019-10-26 DIAGNOSIS — I5022 Chronic systolic (congestive) heart failure: Secondary | ICD-10-CM | POA: Diagnosis present

## 2019-10-26 DIAGNOSIS — S199XXA Unspecified injury of neck, initial encounter: Secondary | ICD-10-CM | POA: Diagnosis not present

## 2019-10-26 DIAGNOSIS — K5900 Constipation, unspecified: Secondary | ICD-10-CM | POA: Diagnosis not present

## 2019-10-26 DIAGNOSIS — S12000A Unspecified displaced fracture of first cervical vertebra, initial encounter for closed fracture: Secondary | ICD-10-CM | POA: Diagnosis present

## 2019-10-26 DIAGNOSIS — Z974 Presence of external hearing-aid: Secondary | ICD-10-CM | POA: Diagnosis not present

## 2019-10-26 DIAGNOSIS — E876 Hypokalemia: Secondary | ICD-10-CM

## 2019-10-26 DIAGNOSIS — K5730 Diverticulosis of large intestine without perforation or abscess without bleeding: Secondary | ICD-10-CM | POA: Diagnosis not present

## 2019-10-26 DIAGNOSIS — S0990XA Unspecified injury of head, initial encounter: Secondary | ICD-10-CM | POA: Diagnosis not present

## 2019-10-26 DIAGNOSIS — I480 Paroxysmal atrial fibrillation: Secondary | ICD-10-CM | POA: Diagnosis present

## 2019-10-26 DIAGNOSIS — Z803 Family history of malignant neoplasm of breast: Secondary | ICD-10-CM | POA: Diagnosis not present

## 2019-10-26 DIAGNOSIS — Y92410 Unspecified street and highway as the place of occurrence of the external cause: Secondary | ICD-10-CM

## 2019-10-26 DIAGNOSIS — Z66 Do not resuscitate: Secondary | ICD-10-CM | POA: Diagnosis present

## 2019-10-26 DIAGNOSIS — S12100A Unspecified displaced fracture of second cervical vertebra, initial encounter for closed fracture: Secondary | ICD-10-CM | POA: Diagnosis present

## 2019-10-26 DIAGNOSIS — S15102A Unspecified injury of left vertebral artery, initial encounter: Secondary | ICD-10-CM | POA: Diagnosis present

## 2019-10-26 DIAGNOSIS — Z887 Allergy status to serum and vaccine status: Secondary | ICD-10-CM

## 2019-10-26 DIAGNOSIS — Z79899 Other long term (current) drug therapy: Secondary | ICD-10-CM

## 2019-10-26 DIAGNOSIS — Z7289 Other problems related to lifestyle: Secondary | ICD-10-CM

## 2019-10-26 DIAGNOSIS — S12190A Other displaced fracture of second cervical vertebra, initial encounter for closed fracture: Secondary | ICD-10-CM | POA: Diagnosis not present

## 2019-10-26 DIAGNOSIS — Z79890 Hormone replacement therapy (postmenopausal): Secondary | ICD-10-CM

## 2019-10-26 DIAGNOSIS — I7774 Dissection of vertebral artery: Secondary | ICD-10-CM | POA: Diagnosis not present

## 2019-10-26 DIAGNOSIS — R1312 Dysphagia, oropharyngeal phase: Secondary | ICD-10-CM | POA: Diagnosis present

## 2019-10-26 DIAGNOSIS — Z853 Personal history of malignant neoplasm of breast: Secondary | ICD-10-CM | POA: Diagnosis not present

## 2019-10-26 DIAGNOSIS — H409 Unspecified glaucoma: Secondary | ICD-10-CM | POA: Diagnosis present

## 2019-10-26 DIAGNOSIS — I4811 Longstanding persistent atrial fibrillation: Secondary | ICD-10-CM | POA: Diagnosis not present

## 2019-10-26 DIAGNOSIS — S15109A Unspecified injury of unspecified vertebral artery, initial encounter: Secondary | ICD-10-CM

## 2019-10-26 DIAGNOSIS — R222 Localized swelling, mass and lump, trunk: Secondary | ICD-10-CM | POA: Diagnosis not present

## 2019-10-26 DIAGNOSIS — I509 Heart failure, unspecified: Secondary | ICD-10-CM

## 2019-10-26 DIAGNOSIS — S79911A Unspecified injury of right hip, initial encounter: Secondary | ICD-10-CM | POA: Diagnosis not present

## 2019-10-26 DIAGNOSIS — Z888 Allergy status to other drugs, medicaments and biological substances status: Secondary | ICD-10-CM

## 2019-10-26 DIAGNOSIS — S12090A Other displaced fracture of first cervical vertebra, initial encounter for closed fracture: Secondary | ICD-10-CM | POA: Diagnosis not present

## 2019-10-26 DIAGNOSIS — J90 Pleural effusion, not elsewhere classified: Secondary | ICD-10-CM | POA: Diagnosis not present

## 2019-10-26 DIAGNOSIS — S299XXA Unspecified injury of thorax, initial encounter: Secondary | ICD-10-CM | POA: Diagnosis not present

## 2019-10-26 DIAGNOSIS — I10 Essential (primary) hypertension: Secondary | ICD-10-CM | POA: Diagnosis not present

## 2019-10-26 DIAGNOSIS — I4891 Unspecified atrial fibrillation: Secondary | ICD-10-CM

## 2019-10-26 DIAGNOSIS — Z85850 Personal history of malignant neoplasm of thyroid: Secondary | ICD-10-CM | POA: Diagnosis not present

## 2019-10-26 DIAGNOSIS — Z7901 Long term (current) use of anticoagulants: Secondary | ICD-10-CM | POA: Diagnosis not present

## 2019-10-26 DIAGNOSIS — Z20822 Contact with and (suspected) exposure to covid-19: Secondary | ICD-10-CM | POA: Diagnosis present

## 2019-10-26 DIAGNOSIS — I4819 Other persistent atrial fibrillation: Secondary | ICD-10-CM | POA: Diagnosis not present

## 2019-10-26 HISTORY — DX: Malignant neoplasm of unspecified site of unspecified female breast: C50.919

## 2019-10-26 LAB — BASIC METABOLIC PANEL
Anion gap: 12 (ref 5–15)
BUN: 16 mg/dL (ref 8–23)
CO2: 25 mmol/L (ref 22–32)
Calcium: 8.8 mg/dL — ABNORMAL LOW (ref 8.9–10.3)
Chloride: 100 mmol/L (ref 98–111)
Creatinine, Ser: 0.78 mg/dL (ref 0.44–1.00)
GFR calc Af Amer: >60 mL/min (ref >60)
GFR calc non Af Amer: >60 mL/min (ref >60)
Glucose, Bld: 113 mg/dL — ABNORMAL HIGH (ref 70–99)
Potassium: 3.1 mmol/L — ABNORMAL LOW (ref 3.5–5.1)
Sodium: 137 mmol/L (ref 135–145)

## 2019-10-26 LAB — CBC WITH DIFFERENTIAL/PLATELET
Abs Immature Granulocytes: 0.05 10*3/uL (ref 0.00–0.07)
Basophils Absolute: 0 10*3/uL (ref 0.0–0.1)
Basophils Relative: 0 %
Eosinophils Absolute: 0 10*3/uL (ref 0.0–0.5)
Eosinophils Relative: 0 %
HCT: 41.3 % (ref 36.0–46.0)
Hemoglobin: 13.9 g/dL (ref 12.0–15.0)
Immature Granulocytes: 1 %
Lymphocytes Relative: 10 %
Lymphs Abs: 1.1 10*3/uL (ref 0.7–4.0)
MCH: 29.2 pg (ref 26.0–34.0)
MCHC: 33.7 g/dL (ref 30.0–36.0)
MCV: 86.8 fL (ref 80.0–100.0)
Monocytes Absolute: 0.9 10*3/uL (ref 0.1–1.0)
Monocytes Relative: 8 %
Neutro Abs: 8.6 10*3/uL — ABNORMAL HIGH (ref 1.7–7.7)
Neutrophils Relative %: 81 %
Platelets: 172 10*3/uL (ref 150–400)
RBC: 4.76 MIL/uL (ref 3.87–5.11)
RDW: 15.1 % (ref 11.5–15.5)
WBC: 10.6 10*3/uL — ABNORMAL HIGH (ref 4.0–10.5)
nRBC: 0 % (ref 0.0–0.2)

## 2019-10-26 LAB — SARS CORONAVIRUS 2 BY RT PCR (HOSPITAL ORDER, PERFORMED IN ~~LOC~~ HOSPITAL LAB): SARS Coronavirus 2: NEGATIVE

## 2019-10-26 MED ORDER — POTASSIUM CHLORIDE CRYS ER 20 MEQ PO TBCR
40.0000 meq | EXTENDED_RELEASE_TABLET | Freq: Once | ORAL | Status: AC
  Administered 2019-10-26: 40 meq via ORAL
  Filled 2019-10-26: qty 2

## 2019-10-26 MED ORDER — FENTANYL CITRATE (PF) 100 MCG/2ML IJ SOLN
50.0000 ug | Freq: Once | INTRAMUSCULAR | Status: AC
  Administered 2019-10-26: 50 ug via INTRAVENOUS
  Filled 2019-10-26: qty 2

## 2019-10-26 MED ORDER — MORPHINE SULFATE (PF) 4 MG/ML IV SOLN: 4.0000 mg | Freq: Once | INTRAVENOUS | Status: DC

## 2019-10-26 MED ORDER — ACETAMINOPHEN 325 MG PO TABS
650.0000 mg | ORAL_TABLET | Freq: Four times a day (QID) | ORAL | Status: DC | PRN
  Administered 2019-10-26 – 2019-10-29 (×3): 650 mg via ORAL
  Filled 2019-10-26 (×5): qty 2

## 2019-10-26 MED ORDER — ACETAMINOPHEN 650 MG RE SUPP: 650.0000 mg | Freq: Four times a day (QID) | RECTAL | Status: DC | PRN

## 2019-10-26 MED ORDER — MORPHINE SULFATE (PF) 2 MG/ML IV SOLN
2.0000 mg | INTRAVENOUS | Status: DC | PRN
  Administered 2019-10-26 – 2019-11-02 (×11): 2 mg via INTRAVENOUS
  Filled 2019-10-26 (×11): qty 1

## 2019-10-26 MED ORDER — IOHEXOL 350 MG/ML SOLN
100.0000 mL | Freq: Once | INTRAVENOUS | Status: AC | PRN
  Administered 2019-10-26: 80 mL via INTRAVENOUS

## 2019-10-26 MED ORDER — MORPHINE SULFATE (PF) 4 MG/ML IV SOLN
4.0000 mg | Freq: Once | INTRAVENOUS | Status: AC
  Administered 2019-10-26: 4 mg via INTRAVENOUS
  Filled 2019-10-26: qty 1

## 2019-10-26 MED ORDER — MORPHINE SULFATE (PF) 4 MG/ML IV SOLN
INTRAVENOUS | Status: AC
  Administered 2019-10-26: 4 mg via INTRAVENOUS
  Filled 2019-10-26: qty 1

## 2019-10-26 NOTE — ED Notes (Signed)
[facility] arrived for transport. Neuro status remains intact with distal CMS x 4 extremities.
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
## 2019-10-26 NOTE — Progress Notes (Signed)
Pt arrived to [facility] via [facility]. [facility] admissions notified of pt's arrival. Call bell within reach.

## 2019-10-27 ENCOUNTER — Inpatient Hospital Stay (HOSPITAL_COMMUNITY): Payer: No Typology Code available for payment source

## 2019-10-27 DIAGNOSIS — I4811 Longstanding persistent atrial fibrillation: Secondary | ICD-10-CM

## 2019-10-27 DIAGNOSIS — E876 Hypokalemia: Secondary | ICD-10-CM

## 2019-10-27 DIAGNOSIS — I10 Essential (primary) hypertension: Secondary | ICD-10-CM

## 2019-10-27 DIAGNOSIS — I5022 Chronic systolic (congestive) heart failure: Secondary | ICD-10-CM

## 2019-10-27 LAB — CBC
HCT: 45.1 % (ref 36.0–46.0)
Hemoglobin: 14.6 g/dL (ref 12.0–15.0)
MCH: 28.8 pg (ref 26.0–34.0)
MCHC: 32.4 g/dL (ref 30.0–36.0)
MCV: 89 fL (ref 80.0–100.0)
Platelets: UNDETERMINED 10*3/uL (ref 150–400)
RBC: 5.07 MIL/uL (ref 3.87–5.11)
RDW: 14.9 % (ref 11.5–15.5)
WBC: 11.6 10*3/uL — ABNORMAL HIGH (ref 4.0–10.5)
nRBC: 0 % (ref 0.0–0.2)

## 2019-10-27 LAB — BASIC METABOLIC PANEL
Anion gap: 12 (ref 5–15)
BUN: 10 mg/dL (ref 8–23)
CO2: 24 mmol/L (ref 22–32)
Calcium: 8.7 mg/dL — ABNORMAL LOW (ref 8.9–10.3)
Chloride: 102 mmol/L (ref 98–111)
Creatinine, Ser: 0.59 mg/dL (ref 0.44–1.00)
GFR calc Af Amer: >60 mL/min (ref >60)
GFR calc non Af Amer: >60 mL/min (ref >60)
Glucose, Bld: 105 mg/dL — ABNORMAL HIGH (ref 70–99)
Potassium: 3.3 mmol/L — ABNORMAL LOW (ref 3.5–5.1)
Sodium: 138 mmol/L (ref 135–145)

## 2019-10-27 LAB — MAGNESIUM: Magnesium: 1.8 mg/dL (ref 1.7–2.4)

## 2019-10-27 MED ORDER — HYDROCODONE-ACETAMINOPHEN 5-325 MG PO TABS
1.0000 | ORAL_TABLET | ORAL | Status: DC | PRN
  Administered 2019-10-27 – 2019-11-04 (×23): 2 via ORAL
  Filled 2019-10-27 (×23): qty 2

## 2019-10-27 MED ORDER — DILTIAZEM HCL ER COATED BEADS 180 MG PO CP24
180.0000 mg | ORAL_CAPSULE | Freq: Every day | ORAL | Status: DC
  Administered 2019-10-27 – 2019-10-30 (×4): 180 mg via ORAL
  Filled 2019-10-27 (×4): qty 1

## 2019-10-27 MED ORDER — LEVOTHYROXINE SODIUM 88 MCG PO TABS
88.0000 ug | ORAL_TABLET | Freq: Every day | ORAL | Status: DC
  Administered 2019-10-27 – 2019-11-04 (×8): 88 ug via ORAL
  Filled 2019-10-27 (×9): qty 1

## 2019-10-27 MED ORDER — CHOLECALCIFEROL 10 MCG (400 UNIT) PO TABS
400.0000 [IU] | ORAL_TABLET | Freq: Every day | ORAL | Status: DC
  Administered 2019-10-27 – 2019-11-04 (×8): 400 [IU] via ORAL
  Filled 2019-10-27 (×9): qty 1

## 2019-10-27 MED ORDER — SACUBITRIL-VALSARTAN 24-26 MG PO TABS
1.0000 | ORAL_TABLET | Freq: Two times a day (BID) | ORAL | Status: DC
  Administered 2019-10-27 – 2019-11-04 (×14): 1 via ORAL
  Filled 2019-10-27 (×17): qty 1

## 2019-10-27 MED ORDER — HYDROMORPHONE HCL 1 MG/ML IJ SOLN
1.0000 mg | INTRAMUSCULAR | Status: DC | PRN
  Administered 2019-10-31 – 2019-11-03 (×10): 1 mg via INTRAVENOUS
  Filled 2019-10-27 (×10): qty 1

## 2019-10-27 MED ORDER — FLECAINIDE ACETATE 50 MG PO TABS
50.0000 mg | ORAL_TABLET | Freq: Two times a day (BID) | ORAL | Status: DC
  Administered 2019-10-27 – 2019-11-04 (×17): 50 mg via ORAL
  Filled 2019-10-27 (×19): qty 1

## 2019-10-27 MED ORDER — GADOBUTROL 1 MMOL/ML IV SOLN
6.5000 mL | Freq: Once | INTRAVENOUS | Status: AC | PRN
  Administered 2019-10-27: 6.5 mL via INTRAVENOUS

## 2019-10-27 MED ORDER — LATANOPROST 0.005 % OP SOLN
1.0000 [drp] | Freq: Every day | OPHTHALMIC | Status: DC
  Administered 2019-10-27 – 2019-11-03 (×8): 1 [drp] via OPHTHALMIC
  Filled 2019-10-27 (×2): qty 2.5

## 2019-10-27 MED ORDER — CALCIUM CARBONATE-VITAMIN D 500-200 MG-UNIT PO TABS
1.0000 | ORAL_TABLET | Freq: Two times a day (BID) | ORAL | Status: DC
  Administered 2019-10-27 – 2019-11-04 (×15): 1 via ORAL
  Filled 2019-10-27 (×17): qty 1

## 2019-10-27 MED ORDER — MELOXICAM 7.5 MG PO TABS
7.5000 mg | ORAL_TABLET | Freq: Every day | ORAL | Status: DC | PRN
  Filled 2019-10-27: qty 1

## 2019-10-27 MED ORDER — APIXABAN 5 MG PO TABS
5.0000 mg | ORAL_TABLET | Freq: Two times a day (BID) | ORAL | Status: DC
  Administered 2019-10-27 – 2019-11-04 (×17): 5 mg via ORAL
  Filled 2019-10-27 (×17): qty 1

## 2019-10-27 MED ORDER — POTASSIUM CHLORIDE 10 MEQ/100ML IV SOLN
10.0000 meq | INTRAVENOUS | Status: AC
  Administered 2019-10-27 (×4): 10 meq via INTRAVENOUS
  Filled 2019-10-27 (×4): qty 100

## 2019-10-27 MED ORDER — HYPROMELLOSE (GONIOSCOPIC) 2.5 % OP SOLN
1.0000 [drp] | Freq: Three times a day (TID) | OPHTHALMIC | Status: DC | PRN
  Filled 2019-10-27: qty 15

## 2019-10-27 MED ORDER — OCUVITE-LUTEIN PO CAPS: 1.0000 | ORAL_CAPSULE | Freq: Every day | ORAL | Status: DC
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
## 2019-10-27 NOTE — Plan of Care (Signed)

## 2019-10-27 NOTE — Progress Notes (Signed)
OT Cancellation Note  Patient Details Name: Laura Lee MRN: [id number] DOB: 1932/03/30   Cancelled Treatment:    Reason Eval/Treat Not Completed: Pain limiting ability to participate(RN to give pain meds. Will return later today. )  Adri Schloss R Howerton-Davis 10/27/2019, 9:56 AM  Kreg Earhart R. Howerton-Davis, OTR/L

## 2019-10-27 NOTE — Progress Notes (Signed)
Pt assessed by neurosurgery, experiencing sever pain 10/10-pt visibly distressed.  No PRN available.  APP notified- Care order instruction to administer tylenol on the chart.  Tylenol given.    Pt has 2mg  IV morphine q4 PRN.  The morphine is only controlling her pain for about 2 hours.
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
## 2019-10-28 ENCOUNTER — Inpatient Hospital Stay (HOSPITAL_COMMUNITY): Payer: No Typology Code available for payment source

## 2019-10-28 DIAGNOSIS — R222 Localized swelling, mass and lump, trunk: Secondary | ICD-10-CM

## 2019-10-28 LAB — CBC WITH DIFFERENTIAL/PLATELET
Abs Immature Granulocytes: 0.06 10*3/uL (ref 0.00–0.07)
Basophils Absolute: 0 10*3/uL (ref 0.0–0.1)
Basophils Relative: 0 %
Eosinophils Absolute: 0 10*3/uL (ref 0.0–0.5)
Eosinophils Relative: 0 %
HCT: 44.8 % (ref 36.0–46.0)
Hemoglobin: 15 g/dL (ref 12.0–15.0)
Immature Granulocytes: 1 %
Lymphocytes Relative: 11 %
Lymphs Abs: 1.2 10*3/uL (ref 0.7–4.0)
MCH: 29.5 pg (ref 26.0–34.0)
MCHC: 33.5 g/dL (ref 30.0–36.0)
MCV: 88.2 fL (ref 80.0–100.0)
Monocytes Absolute: 1.1 10*3/uL — ABNORMAL HIGH (ref 0.1–1.0)
Monocytes Relative: 10 %
Neutro Abs: 8.5 10*3/uL — ABNORMAL HIGH (ref 1.7–7.7)
Neutrophils Relative %: 78 %
Platelets: UNDETERMINED 10*3/uL (ref 150–400)
RBC: 5.08 MIL/uL (ref 3.87–5.11)
RDW: 14.6 % (ref 11.5–15.5)
WBC: 10.9 10*3/uL — ABNORMAL HIGH (ref 4.0–10.5)
nRBC: 0 % (ref 0.0–0.2)

## 2019-10-28 LAB — BASIC METABOLIC PANEL
Anion gap: 10 (ref 5–15)
BUN: 16 mg/dL (ref 8–23)
CO2: 27 mmol/L (ref 22–32)
Calcium: 9.2 mg/dL (ref 8.9–10.3)
Chloride: 99 mmol/L (ref 98–111)
Creatinine, Ser: 0.72 mg/dL (ref 0.44–1.00)
GFR calc Af Amer: >60 mL/min (ref >60)
GFR calc non Af Amer: >60 mL/min (ref >60)
Glucose, Bld: 94 mg/dL (ref 70–99)
Potassium: 3.7 mmol/L (ref 3.5–5.1)
Sodium: 136 mmol/L (ref 135–145)
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
## 2019-10-28 NOTE — Progress Notes (Signed)
   10/28/19 0133  Assess: MEWS Score  ECG Heart Rate (!) 114  Assess: MEWS Score  MEWS Temp 0  MEWS Systolic 0  MEWS Pulse 2  MEWS RR 0  MEWS LOC 0  MEWS Score 2  MEWS Score Color Yellow    Yellow MEWS triggered based solely on heart rate.  Pt has history of AF.  Per CCMD, pt rhythm at this point in time is AF.  Pt immediately converted bask to NSR.  Pt assessed and is asymptomatic.  Complete set of vitals obtained: resulting MEWS of green.      10/28/19 0135  Assess: MEWS Score  Temp 98.7 F (37.1 C)  BP (!) 148/72  Pulse Rate 84  Resp 14  Level of Consciousness Alert  SpO2 94 %  O2 Device Room Air  Patient Activity (if Appropriate) In bed  Assess: MEWS Score  MEWS Temp 0  MEWS Systolic 0  MEWS Pulse 0  MEWS RR 0  MEWS LOC 0  MEWS Score 0  MEWS Score Color Green      Charge RN [employer] notified.  This RN does not feel further escalation is warranted in light of patient history and current condition.    MEWS guidelines for vital signs will be completed.  Next VS due at 3:33 am.

## 2019-10-28 NOTE — Progress Notes (Signed)
Patient ID: Laura Lee, female   DOB: [birth year], 86 y.o.   MRN: [id number] Patient is in a collar.  She has a little bit of neck pain but it seems mild.  She is moving her extremities.  MRI of thoracic spine reviewed and it shows a paraspinal mass at T4-5 which extends into the posterior elements on the right but does not involve the canal or the spinal cord.  I suspect this is metastatic disease.  Would recommend work-up's CT of the chest abdomen and pelvis looking for primary, need CT surgery consult or IR consult for biopsy of lesion.  Since this does not involve the spinal canal but instead more the chest cavity this is not really something that we, as neurosurgeons, would biopsy or manage.

## 2019-10-28 NOTE — Progress Notes (Signed)
Orthopedic Tech Progress Note Patient Details:  Laura Lee [birth year] [id number]  Ortho Devices Type of Ortho Device: [place] cervical collar Ortho Device/Splint Interventions: Ordered    Dropped of collar to patient per nurses request, did not apply collar.   Tammy Sours 10/28/2019, 2:05 PM
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
## 2019-10-29 ENCOUNTER — Inpatient Hospital Stay (HOSPITAL_COMMUNITY): Payer: No Typology Code available for payment source

## 2019-10-29 MED ORDER — DOCUSATE SODIUM 100 MG PO CAPS
200.0000 mg | ORAL_CAPSULE | Freq: Once | ORAL | Status: AC
  Administered 2019-10-29: 200 mg via ORAL
  Filled 2019-10-29: qty 2

## 2019-10-29 MED ORDER — IOHEXOL 9 MG/ML PO SOLN: 500.0000 mL | ORAL | Status: AC

## 2019-10-29 MED ORDER — IOHEXOL 300 MG/ML  SOLN
100.0000 mL | Freq: Once | INTRAMUSCULAR | Status: AC | PRN
  Administered 2019-10-29: 100 mL via INTRAVENOUS
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
## 2019-10-29 NOTE — Plan of Care (Signed)
  Problem: Pain Managment: Goal: General experience of comfort will improve Outcome: Progressing   Problem: Safety: Goal: Ability to remain free from injury will improve Outcome: Progressing   Problem: Skin Integrity: Goal: Risk for impaired skin integrity will decrease Outcome: Progressing   

## 2019-10-30 DIAGNOSIS — I4819 Other persistent atrial fibrillation: Secondary | ICD-10-CM

## 2019-10-30 LAB — BASIC METABOLIC PANEL
Anion gap: 9 (ref 5–15)
BUN: 29 mg/dL — ABNORMAL HIGH (ref 8–23)
CO2: 27 mmol/L (ref 22–32)
Calcium: 9.2 mg/dL (ref 8.9–10.3)
Chloride: 98 mmol/L (ref 98–111)
Creatinine, Ser: 0.88 mg/dL (ref 0.44–1.00)
GFR calc Af Amer: >60 mL/min (ref >60)
GFR calc non Af Amer: 60 mL/min — ABNORMAL LOW (ref >60)
Glucose, Bld: 173 mg/dL — ABNORMAL HIGH (ref 70–99)
Potassium: 4.2 mmol/L (ref 3.5–5.1)
Sodium: 134 mmol/L — ABNORMAL LOW (ref 135–145)

## 2019-10-30 LAB — PROTIME-INR
INR: 1.3 — ABNORMAL HIGH (ref 0.8–1.2)
Prothrombin Time: 16.1 seconds — ABNORMAL HIGH (ref 11.4–15.2)

## 2019-10-30 LAB — MAGNESIUM: Magnesium: 2 mg/dL (ref 1.7–2.4)

## 2019-10-30 MED ORDER — BISACODYL 10 MG RE SUPP
10.0000 mg | Freq: Once | RECTAL | Status: DC
  Filled 2019-10-30: qty 1

## 2019-10-30 MED ORDER — DILTIAZEM HCL ER COATED BEADS 240 MG PO CP24
240.0000 mg | ORAL_CAPSULE | Freq: Every day | ORAL | Status: DC
  Administered 2019-10-31 – 2019-11-04 (×5): 240 mg via ORAL
  Filled 2019-10-30 (×6): qty 1

## 2019-10-30 MED ORDER — DOCUSATE SODIUM 100 MG PO CAPS
200.0000 mg | ORAL_CAPSULE | Freq: Once | ORAL | Status: AC
  Administered 2019-10-30: 200 mg via ORAL
  Filled 2019-10-30: qty 2

## 2019-10-30 MED ORDER — DILTIAZEM HCL 30 MG PO TABS: 180.0000 mg | ORAL_TABLET | Freq: Once | ORAL | Status: DC

## 2019-10-30 MED ORDER — DILTIAZEM HCL ER COATED BEADS 180 MG PO CP24
180.0000 mg | ORAL_CAPSULE | Freq: Once | ORAL | Status: AC
  Administered 2019-10-30: 180 mg via ORAL
  Filled 2019-10-30: qty 1

## 2019-10-30 NOTE — TOC CAGE-AID Note (Signed)
Transition of Care [facility]) - CAGE-AID Screening   Patient Details  Name: Laura Lee MRN: [id number] Date of Birth: [birth year]  Transition of Care [facility]) CM/SW Contact:    Emeterio Reeve, [place] Phone Number: 10/30/2019, 1:00 PM   Clinical Narrative:  Pt denied alcohol and substance use.    CAGE-AID Screening:    Have You Ever Felt You Ought to Cut Down on Your Drinking or Drug Use?: No Have People Annoyed You By Critizing Your Drinking Or Drug Use?: No Have You Felt Bad Or Guilty About Your Drinking Or Drug Use?: No Have You Ever Had a Drink or Used Drugs First Thing In The Morning to STeady Your Nerves or to Get Rid of a Hangover?: No CAGE-AID Score: 0  Substance Abuse Education Offered: No    Blima Ledger, [place] Social Worker [phone]

## 2019-10-30 NOTE — Progress Notes (Signed)
   Discussed personally with Dr. Ronnald Ramp of Neurosurgery who saw patient over weekend.   OK to proceed with direct current cardioversion with patient in C-Collar. Discussed with son and patient that even if we can get her resting HR down, exertional HR may still limit her therapy. They both agree to proceed with DCCV. She has had them before and is comfortable with the procedure. She has not missed any of her Eliquis so will not require TEE.   Pt will need to continue her Eliquis without interruption for at least 4 weeks post cardioversion, which may delay biopsy. Pt and son are OK with this.   Pt scheduled for tomorrow pm at 1400 with Dr. Meda Coffee.   All above discussed with Dr. Curt Bears.   Legrand Como [address] [place], PA-C  10/30/2019 4:17 PM

## 2019-10-30 NOTE — Progress Notes (Signed)
Telemetry triggered yellow [place] d/t heart rate of 116 in afib and 145 afib At the time patient was being changed in the bed and repositioned.   Upon reassessment patient has history of afib and states she feels fine and is probably in afib d/t stress.  Pain 5/10 PRN pain medicine given and patient is resting in bed.   Repeat Vitals at bedside  Temp: 97.5 BP: 108/60 Pulse: 92 Respirations: 16 O2: 93% on room air   MD Blount notified via page   Will continue to monitor.
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
## 2019-10-30 NOTE — Progress Notes (Signed)
Subjective: The patient is alert and pleasant.  Her son is at the bedside.  She looks and feels better.  Objective: Vital signs in last 24 hours: Temp:  [97.5 F (36.4 C)-98.2 F (36.8 C)] 98.2 F (36.8 C) (06/07 0843) Pulse Rate:  [72-98] 98 (06/07 0843) Resp:  [16-17] 17 (06/07 0843) BP: (108-144)/(60-90) 118/83 (06/07 0843) SpO2:  [93 %-95 %] 95 % (06/07 0843) Estimated body mass index is 23.4 kg/m as calculated from the following:   Height as of this encounter: 5\' 6"  (1.676 m).   Weight as of this encounter: 65.8 kg.   Intake/Output from previous day: 06/06 0701 - 06/07 0700 In: 600 [P.O.:600] Out: 1800 [Urine:1800] Intake/Output this shift: No intake/output data recorded.  Physical exam patient is alert and oriented.  She is moving all 4 extremities well.  She is wearing her Aspen collar.  Lab Results: Recent Labs    10/28/19 0341  WBC 10.9*  HGB 15.0  HCT 44.8  PLT PLATELET CLUMPS NOTED ON SMEAR, UNABLE TO ESTIMATE   BMET Recent Labs    10/28/19 0341 10/30/19 1405  NA 136 134*  K 3.7 4.2  CL 99 98  CO2 27 27  GLUCOSE 94 173*  BUN 16 29*  CREATININE 0.72 0.88  CALCIUM 9.2 9.2    Studies/Results: CT CHEST W CONTRAST  Result Date: 10/29/2019 CLINICAL DATA:  Thoracic spine bone lesion, malignancy suspected EXAM: CT CHEST, ABDOMEN, AND PELVIS WITH CONTRAST TECHNIQUE: Multidetector CT imaging of the chest, abdomen and pelvis was performed following the standard protocol during bolus administration of intravenous contrast. CONTRAST:  162mL OMNIPAQUE IOHEXOL 300 MG/ML  SOLN COMPARISON:  MR thoracic spine, 10/27/2019, CT head and neck angiogram, 10/26/2019 FINDINGS: CT CHEST FINDINGS Cardiovascular: Aortic atherosclerosis. Mild cardiomegaly. Left coronary artery calcifications. No pericardial effusion. Mediastinum/Nodes: No enlarged mediastinal, hilar, or axillary lymph nodes. Status post right thyroidectomy. Trachea, and esophagus demonstrate no significant  findings. Lungs/Pleura: Trace bilateral pleural effusions and associated atelectasis or consolidation. Musculoskeletal: There is a redemonstrated expansile mass arising from the right T4 neural foramen measuring approximately 3.2 x 2.8 x 2.6 cm (series 3, image 17, series 6, image 92). There appears to be some erosion of the right T4 pedicle and transverse process, however there do appear to be some corticated margins about the vertebral body anteriorly. CT ABDOMEN PELVIS FINDINGS Hepatobiliary: No solid liver abnormality is seen. No gallstones, gallbladder wall thickening, or biliary dilatation. Pancreas: Unremarkable. No pancreatic ductal dilatation or surrounding inflammatory changes. Spleen: Normal in size without significant abnormality. Adrenals/Urinary Tract: Adrenal glands are unremarkable. Kidneys are normal, without renal calculi, solid lesion, or hydronephrosis. Bladder is unremarkable. Stomach/Bowel: Stomach is within normal limits. Appendix appears normal. No evidence of bowel wall thickening, distention, or inflammatory changes. Sigmoid diverticulosis. Vascular/Lymphatic: Aortic atherosclerosis. No enlarged abdominal or pelvic lymph nodes. Reproductive: No mass or other abnormality. Other: No abdominal wall hernia or abnormality. No abdominopelvic ascites. Musculoskeletal: No acute or significant osseous findings. IMPRESSION: 1. There is a redemonstrated expansile mass arising from the right T4 neural foramen measuring approximately 3.2 x 2.8 x 2.6 cm. There appears to be some erosion of the right T4 pedicle and transverse process, however there do appear to be some corticated margins about the vertebral body anteriorly. 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 Although nerve sheath tumors are most commonly benign, erosive appearance is concerning for malignant transformation. Metabolic activity can be characterized by PET-CT if  desired with consideration of tissue sampling. 2. No other evidence of malignancy in the chest, abdomen, or pelvis. 3. Trace bilateral pleural effusions and associated atelectasis or consolidation. 4. Mild cardiomegaly and coronary artery disease. 5. Sigmoid diverticulosis without evidence of diverticulitis. 6. Status post right thyroidectomy. 7. Aortic Atherosclerosis (ICD10-I70.0). Electronically Signed   By: Eddie Candle M.D.   On: 10/29/2019 09:47   CT ABDOMEN PELVIS W CONTRAST  Result Date: 10/29/2019 CLINICAL DATA:  Thoracic spine bone lesion, malignancy suspected EXAM: CT CHEST, ABDOMEN, AND PELVIS WITH CONTRAST TECHNIQUE: Multidetector CT imaging of the chest, abdomen and pelvis was performed following the standard protocol during bolus administration of intravenous contrast. CONTRAST:  162mL OMNIPAQUE IOHEXOL 300 MG/ML  SOLN COMPARISON:  MR thoracic spine, 10/27/2019, CT head and neck angiogram, 10/26/2019 FINDINGS: CT CHEST FINDINGS Cardiovascular: Aortic atherosclerosis. Mild cardiomegaly. Left coronary artery calcifications. No pericardial effusion. Mediastinum/Nodes: No enlarged mediastinal, hilar, or axillary lymph nodes. Status post right thyroidectomy. Trachea, and esophagus demonstrate no significant findings. Lungs/Pleura: Trace bilateral pleural effusions and associated atelectasis or consolidation. Musculoskeletal: There is a redemonstrated expansile mass arising from the right T4 neural foramen measuring approximately 3.2 x 2.8 x 2.6 cm (series 3, image 17, series 6, image 92). There appears to be some erosion of the right T4 pedicle and transverse process, however there do appear to be some corticated margins about the vertebral body anteriorly. CT ABDOMEN PELVIS FINDINGS Hepatobiliary: No solid liver abnormality is seen. No gallstones, gallbladder wall thickening, or biliary dilatation. Pancreas: Unremarkable. No pancreatic ductal dilatation or surrounding inflammatory changes. Spleen:  Normal in size without significant abnormality. Adrenals/Urinary Tract: Adrenal glands are unremarkable. Kidneys are normal, without renal calculi, solid lesion, or hydronephrosis. Bladder is unremarkable. Stomach/Bowel: Stomach is within normal limits. Appendix appears normal. No evidence of bowel wall thickening, distention, or inflammatory changes. Sigmoid diverticulosis. Vascular/Lymphatic: Aortic atherosclerosis. No enlarged abdominal or pelvic lymph nodes. Reproductive: No mass or other abnormality. Other: No abdominal wall hernia or abnormality. No abdominopelvic ascites. Musculoskeletal: No acute or significant osseous findings. IMPRESSION: 1. There is a redemonstrated expansile mass arising from the right T4 neural foramen measuring approximately 3.2 x 2.8 x 2.6 cm. There appears to be some erosion of the right T4 pedicle and transverse process, however there do appear to be some corticated margins about the vertebral body anteriorly. 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 Although nerve sheath tumors are most commonly benign, erosive appearance is concerning for malignant transformation. Metabolic activity can be characterized by PET-CT if desired with consideration of tissue sampling. 2. No other evidence of malignancy in the chest, abdomen, or pelvis. 3. Trace bilateral pleural effusions and associated atelectasis or consolidation. 4. Mild cardiomegaly and coronary artery disease. 5. Sigmoid diverticulosis without evidence of diverticulitis. 6. Status post right thyroidectomy. 7. Aortic Atherosclerosis (ICD10-I70.0). Electronically Signed   By: Eddie Candle M.D.   On: 10/29/2019 09:47    Assessment/Plan: C1/C2 fracture: I have discussed this with him.  I have again recommend we continue her cervical collar and try to avoid surgery.  Atrial fibrillation: Noted  Thoracic tumor: The CT of her chest abdomen pelvis were otherwise  negative for malignancy.  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  Although malignant transformation is possible, it is quite unlikely.  At her age I would recommend observation.  I discussed all this with the patient and her son.  I have answered all their  questions.  LOS: 4 days     Ophelia Charter 10/30/2019, 5:10 PM

## 2019-10-30 NOTE — Progress Notes (Signed)
Patient is aware of the Plan of care f or transfer.  The patient continues to be monitored closely.  The patient is alert and answers all questions.

## 2019-10-30 NOTE — Progress Notes (Signed)
Report given to [place] on [address] [place]

## 2019-10-30 NOTE — Progress Notes (Deleted)
   10/30/19 1618  Assess: MEWS Score  ECG Heart Rate (!) 127  Assess: MEWS Score  MEWS Temp 0  MEWS Systolic 0  MEWS Pulse 2  MEWS RR 0  MEWS LOC 0  MEWS Score 2  MEWS Score Color Yellow
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
## 2019-10-30 NOTE — Progress Notes (Signed)
Patient is stable and awaiting transfer   10/30/19 1845  Vitals  ECG Heart Rate (!) 156  Cardiac Rhythm Atrial fibrillation (Sherrie, Rn notified)  MEWS Score  MEWS Temp 0  MEWS Systolic 0  MEWS Pulse 3  MEWS RR 0  MEWS LOC 0  MEWS Score 3  MEWS Score Color Yellow  Provider Notification  Provider Name/Title  (no further need for notification of MD/ stable and waiting t)  Date Provider Notified  (na)  Time Provider Notified  (na)  Notification Type  (na)  Notification Reason Other (Comment) (MD is aware of waiting for transfer)  Response No new orders  Date of Provider Response 10/30/19  Time of Provider Response  (na)  Rapid Response Notification  Name of Rapid Response RN Notified  (na)  Date Rapid Response Notified  (na)  Time Rapid Response Notified  (na)  Note  Observations  (stable )

## 2019-10-30 NOTE — Progress Notes (Signed)
   10/30/19 0425  Assess: MEWS Score  ECG Heart Rate (!) 116  Assess: MEWS Score  MEWS Temp 0  MEWS Systolic 0  MEWS Pulse 2  MEWS RR 0  MEWS LOC 0  MEWS Score 2  MEWS Score Color Yellow  Assess: if the MEWS score is Yellow or Red  Were vital signs taken at a resting state? No (pt being changed in bed)  Focused Assessment Documented focused assessment  Early Detection of Sepsis Score *See Row Information* Low  MEWS guidelines implemented *See Row Information* No, vital signs rechecked  Treat  MEWS Interventions Other (Comment);Administered prn meds/treatments (hx of afib)  Take Vital Signs  Increase Vital Sign Frequency   (no)  Escalate  MEWS: Escalate  (no)  Notify: Charge Nurse/RN  Name of Charge Nurse/RN Notified [place]  Date Charge Nurse/RN Notified 10/30/19  Time Charge Nurse/RN Notified 0459  Notify: Provider  Provider Name/Title [place]  Date Provider Notified 10/30/19  Time Provider Notified [phone]  Notification Type Page  Notification Reason Other (Comment) (afib )  Notify: Rapid Response  Name of Rapid Response RN Notified n/a  Document  Patient Outcome Stabilized after interventions

## 2019-10-30 NOTE — Progress Notes (Signed)
   10/30/19 0717  Assess: MEWS Score  ECG Heart Rate (!) 128  Assess: MEWS Score  MEWS Temp 0  MEWS Systolic 0  MEWS Pulse 2  MEWS RR 0  MEWS LOC 0  MEWS Score 2  MEWS Score Color Yellow  Assess: if the MEWS score is Yellow or Red  Were vital signs taken at a resting state? No  Focused Assessment Documented focused assessment  Early Detection of Sepsis Score *See Row Information* Low  MEWS guidelines implemented *See Row Information* No, vital signs rechecked  Treat  MEWS Interventions Other (Comment) (history of afib)  Take Vital Signs  Increase Vital Sign Frequency   (no-after rechecking, MEWS green)  Escalate  MEWS: Escalate  (no, after rechecking VS, MEWS green)  Notify: Charge Nurse/RN  Name of Charge Nurse/RN Notified [place], RN  Date Charge Nurse/RN Notified 10/30/19  Time Charge Nurse/RN Notified 0720  Notify: Provider  Provider Name/Title n/a  Notify: Rapid Response  Name of Rapid Response RN Notified n/a  Document  Patient Outcome Stabilized after interventions (rechecked VS, now stable)

## 2019-10-30 NOTE — Progress Notes (Signed)
Focal assessment unchanged POC  Will be to transfer [facility] to [facility]

## 2019-10-30 NOTE — Progress Notes (Signed)
   10/30/19 1722  Assess: MEWS Score  Temp 98.3 F (36.8 C)  BP (!) 154/138  Pulse Rate (!) 114  Resp 18  SpO2 92 %  O2 Device Room Air  Assess: MEWS Score  MEWS Temp 0  MEWS Systolic 0  MEWS Pulse 2  MEWS RR 0  MEWS LOC 0  MEWS Score 2  MEWS Score Color Yellow  Assess: if the MEWS score is Yellow or Red  Were vital signs taken at a resting state? Yes  Focused Assessment Documented focused assessment  Early Detection of Sepsis Score *See Row Information* Low  MEWS guidelines implemented *See Row Information* Yes  Treat  MEWS Interventions Other (Comment) (Checking VS and focused assessment )  Take Vital Signs  Increase Vital Sign Frequency  Yellow: Q 2hr X 2 then Q 4hr X 2, if remains yellow, continue Q 4hrs  Notify: Charge Nurse/RN  Name of Charge Nurse/RN Notified [place]   Date Charge Nurse/RN Notified 10/30/19  Time Charge Nurse/RN Notified 1725  Notify: Provider  Provider Name/Title  (Dr Curt Bears)  Date Provider Notified 10/30/19  Time Provider Notified 1745  Notification Type Page  Notification Reason Other (Comment) (Afib with increased HR)  Response See new orders  Date of Provider Response 10/30/19  Time of Provider Response 1750  Notify: Rapid Response  Name of Rapid Response RN Notified  (NA)  Date Rapid Response Notified  (NA)  Time Rapid Response Notified  (NA)  Document  Patient Outcome Other (Comment) (Stable)  Progress note created (see row info) Yes
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
## 2019-10-30 NOTE — Plan of Care (Signed)

## 2019-10-30 NOTE — Plan of Care (Signed)
  Problem: Education: Goal: Knowledge of General Education information will improve Description: Including pain rating scale, medication(s)/side effects and non-pharmacologic comfort measures Outcome: Progressing   Problem: Pain Managment: Goal: General experience of comfort will improve Outcome: Progressing   Problem: Safety: Goal: Ability to remain free from injury will improve Outcome: Progressing   
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
## 2019-10-30 NOTE — Progress Notes (Signed)
The patient is monitored closely while waiting to be transferred to [facility]   10/30/19 1900  Vitals  ECG Heart Rate (!) 161  Cardiac Rhythm Atrial fibrillation;BBB (Sherrie, Rn notified)  ECG Intervals  QRS interval 0.12  QT interval 0.32  QTc interval 0.55  MEWS Score  MEWS Temp 0  MEWS Systolic 0  MEWS Pulse 3  MEWS RR 0  MEWS LOC 0  MEWS Score 3  MEWS Score Color Yellow  Provider Notification  Provider Name/Title  (na)  Date Provider Notified  (na)  Time Provider Notified  (na)  Notification Type  (na)  Notification Reason  (na)  Response  (na)  Date of Provider Response  (na)  Time of Provider Response  (na)  Rapid Response Notification  Name of Rapid Response RN Notified  (na)  Date Rapid Response Notified  (na)  Time Rapid Response Notified  (na)  Note  Observations  (alert and oriented x 4)

## 2019-10-31 ENCOUNTER — Encounter (HOSPITAL_COMMUNITY): Payer: Self-pay | Admitting: Internal Medicine

## 2019-10-31 ENCOUNTER — Inpatient Hospital Stay (HOSPITAL_COMMUNITY): Payer: No Typology Code available for payment source | Admitting: Anesthesiology

## 2019-10-31 ENCOUNTER — Encounter (HOSPITAL_COMMUNITY)
Admission: EM | Disposition: A | Discharge status: Skilled Nursing Facility | Payer: Self-pay | Source: Home / Self Care | Attending: Internal Medicine

## 2019-10-31 ENCOUNTER — Other Ambulatory Visit: Payer: Self-pay

## 2019-10-31 DIAGNOSIS — I4811 Longstanding persistent atrial fibrillation: Secondary | ICD-10-CM

## 2019-10-31 DIAGNOSIS — I4891 Unspecified atrial fibrillation: Secondary | ICD-10-CM

## 2019-10-31 DIAGNOSIS — R222 Localized swelling, mass and lump, trunk: Secondary | ICD-10-CM

## 2019-10-31 DIAGNOSIS — E876 Hypokalemia: Secondary | ICD-10-CM

## 2019-10-31 HISTORY — PX: CARDIOVERSION: SHX1299

## 2019-10-31 LAB — BASIC METABOLIC PANEL
Anion gap: 10 (ref 5–15)
BUN: 29 mg/dL — ABNORMAL HIGH (ref 8–23)
CO2: 30 mmol/L (ref 22–32)
Calcium: 8.8 mg/dL — ABNORMAL LOW (ref 8.9–10.3)
Chloride: 98 mmol/L (ref 98–111)
Creatinine, Ser: 0.84 mg/dL (ref 0.44–1.00)
GFR calc Af Amer: >60 mL/min (ref >60)
GFR calc non Af Amer: >60 mL/min (ref >60)
Glucose, Bld: 104 mg/dL — ABNORMAL HIGH (ref 70–99)
Potassium: 3.7 mmol/L (ref 3.5–5.1)
Sodium: 138 mmol/L (ref 135–145)

## 2019-10-31 LAB — MAGNESIUM: Magnesium: 2 mg/dL (ref 1.7–2.4)

## 2019-10-31 SURGERY — CARDIOVERSION
Anesthesia: General

## 2019-10-31 MED ORDER — LIDOCAINE 2% (20 MG/ML) 5 ML SYRINGE
INTRAMUSCULAR | Status: DC | PRN
  Administered 2019-10-31: 50 mg via INTRAVENOUS

## 2019-10-31 MED ORDER — PROPOFOL 10 MG/ML IV BOLUS
INTRAVENOUS | Status: DC | PRN
  Administered 2019-10-31: 50 mg via INTRAVENOUS

## 2019-10-31 MED ORDER — SODIUM CHLORIDE 0.9 % IV SOLN: INTRAVENOUS | Status: DC | PRN

## 2019-10-31 NOTE — Progress Notes (Signed)
PT Cancellation Note  Patient Details Name: Laura Lee MRN: [id number] DOB: [birth year]   Cancelled Treatment:    Reason Eval/Treat Not Completed: (P) Medical issues which prohibited therapy Pt HR 139 bpm due to A-fib with RVR. Pt scheduled for cardioversion this afternoon. PT will follow back tomorrow.  Tierra Thoma B. Migdalia Dk PT, DPT Acute Rehabilitation Services Pager [phone] Office [phone]    [place] 10/31/2019, 12:03 PM
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
## 2019-10-31 NOTE — Plan of Care (Signed)
  Problem: Education: Goal: Knowledge of General Education information will improve Description: Including pain rating scale, medication(s)/side effects and non-pharmacologic comfort measures Outcome: Progressing   Problem: Health Behavior/Discharge Planning: Goal: Ability to manage health-related needs will improve Outcome: Progressing   Problem: Pain Managment: Goal: General experience of comfort will improve Outcome: Progressing   

## 2019-10-31 NOTE — Anesthesia Procedure Notes (Signed)
Procedure Name: General with mask airway Date/Time: 10/31/2019 1:46 PM Performed by: Leonor Liv, CRNA Pre-anesthesia Checklist: Patient identified, Emergency Drugs available, Suction available, Patient being monitored and Timeout performed Patient Re-evaluated:Patient Re-evaluated prior to induction Oxygen Delivery Method: Ambu bag Placement Confirmation: positive ETCO2 Dental Injury: Teeth and Oropharynx as per pre-operative assessment

## 2019-10-31 NOTE — Anesthesia Postprocedure Evaluation (Signed)
Anesthesia Post Note  Patient: Laura Lee  Procedure(s) Performed: CARDIOVERSION (N/A )     Patient location during evaluation: PACU Anesthesia Type: General Level of consciousness: awake and alert and oriented Pain management: pain level controlled Vital Signs Assessment: post-procedure vital signs reviewed and stable Respiratory status: spontaneous breathing, nonlabored ventilation and respiratory function stable Cardiovascular status: blood pressure returned to baseline and stable Postop Assessment: no apparent nausea or vomiting Anesthetic complications: no    Last Vitals:  Vitals:   10/31/19 1416 10/31/19 1420  BP: 102/71 138/81  Pulse: 76 77  Resp: 17 19  Temp: 36.8 C   SpO2:  98%    Last Pain:  Vitals:   10/31/19 1416  TempSrc: Axillary  PainSc:                  Sutton Hirsch A.
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
## 2019-10-31 NOTE — Transfer of Care (Signed)
Immediate Anesthesia Transfer of Care Note  Patient: Laura Lee  Procedure(s) Performed: CARDIOVERSION (N/A )  Patient Location: Endoscopy Unit  Anesthesia Type:General  Level of Consciousness: drowsy  Airway & Oxygen Therapy: Patient Spontanous Breathing and Patient connected to nasal cannula oxygen  Post-op Assessment: Report given to RN and Post -op Vital signs reviewed and stable  Post vital signs: Reviewed and stable  Last Vitals:  Vitals Value Taken Time  BP    Temp    Pulse 74 10/31/19 1411  Resp 20 10/31/19 1411  SpO2 96 % 10/31/19 1411  Vitals shown include unvalidated device data.  Last Pain:  Vitals:   10/31/19 1311  TempSrc: Oral  PainSc: 6       Patients Stated Pain Goal: 3 (84/53/64 6803)  Complications: No apparent anesthesia complications

## 2019-10-31 NOTE — Progress Notes (Signed)
   10/31/19 0900  Assess: MEWS Score  ECG Heart Rate (!) 139  Resp 17  Level of Consciousness Alert  Assess: MEWS Score  MEWS Temp 0  MEWS Systolic 0  MEWS Pulse 3  MEWS RR 0  MEWS LOC 0  MEWS Score 3  MEWS Score Color Yellow  Assess: if the MEWS score is Yellow or Red  Were vital signs taken at a resting state? Yes  Focused Assessment Documented focused assessment  Early Detection of Sepsis Score *See Row Information* Low  MEWS guidelines implemented *See Row Information* No, previously yellow, continue vital signs every 4 hours    Patient has been Afib with HR 120-140. Daily meds already given and patient is scheduled for a cardioversion at 2pm

## 2019-10-31 NOTE — Plan of Care (Signed)

## 2019-10-31 NOTE — CV Procedure (Signed)
    Cardioversion Note  Laura Lee [id number] [birth year]  Procedure: DC Cardioversion Indications: atrial fibrillation  Procedure Details Consent: Obtained Time Out: Verified patient identification, verified procedure, site/side was marked, verified correct patient position, special equipment/implants available, Radiology Safety Procedures followed,  medications/allergies/relevent history reviewed, required imaging and test results available.  Performed  The patient has been on adequate anticoagulation.  The patient received IV propofol administered by anesthesia staff for deep sedation.  Synchronous cardioversion was performed at 120 joules x 1.  The cardioversion was successful.   Complications: No apparent complications Patient did tolerate procedure well.   [name], MD, [facility] 10/31/2019, 6:34 PM
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
## 2019-11-01 DIAGNOSIS — I4819 Other persistent atrial fibrillation: Secondary | ICD-10-CM

## 2019-11-01 LAB — BASIC METABOLIC PANEL
Anion gap: 7 (ref 5–15)
BUN: 25 mg/dL — ABNORMAL HIGH (ref 8–23)
CO2: 30 mmol/L (ref 22–32)
Calcium: 8.5 mg/dL — ABNORMAL LOW (ref 8.9–10.3)
Chloride: 100 mmol/L (ref 98–111)
Creatinine, Ser: 0.74 mg/dL (ref 0.44–1.00)
GFR calc Af Amer: >60 mL/min (ref >60)
GFR calc non Af Amer: >60 mL/min (ref >60)
Glucose, Bld: 101 mg/dL — ABNORMAL HIGH (ref 70–99)
Potassium: 4 mmol/L (ref 3.5–5.1)
Sodium: 137 mmol/L (ref 135–145)

## 2019-11-01 MED ORDER — SENNOSIDES-DOCUSATE SODIUM 8.6-50 MG PO TABS
1.0000 | ORAL_TABLET | Freq: Two times a day (BID) | ORAL | Status: DC
  Administered 2019-11-01 – 2019-11-04 (×6): 1 via ORAL
  Filled 2019-11-01 (×6): qty 1

## 2019-11-01 MED ORDER — RESOURCE THICKENUP CLEAR PO POWD
ORAL | Status: DC | PRN
  Filled 2019-11-01: qty 125
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
## 2019-11-01 NOTE — Care Management Important Message (Signed)
Important Message  Patient Details  Name: Laura Lee MRN: [id number] Date of Birth: September 04, 1931   Medicare Important Message Given:  Yes     Shelda Altes 11/01/2019, 11:29 AM

## 2019-11-01 NOTE — Plan of Care (Signed)
  Problem: Clinical Measurements: Goal: Cardiovascular complication will be avoided Outcome: Progressing   Problem: Activity: Goal: Risk for activity intolerance will decrease Outcome: Progressing   Problem: Cardiac: Goal: Ability to achieve and maintain adequate cardiopulmonary perfusion will improve Outcome: Progressing Note: Patient has remained in NSR

## 2019-11-01 NOTE — Plan of Care (Signed)

## 2019-11-01 NOTE — Progress Notes (Signed)
OT Cancellation Note  Patient Details Name: Laura Lee MRN: [id number] DOB: Dec 26, 1931   Cancelled Treatment:    Reason Eval/Treat Not Completed: Other (comment); currently in with speech therapy. Will follow up for OT treatment as able.  Lou Cal, OT Acute Rehabilitation Services Pager [phone] Office [phone]   Raymondo Band 11/01/2019, 3:56 PM

## 2019-11-01 NOTE — Progress Notes (Signed)
   Pt remains in NSR s/p DCCV.   She is stable for discharge from a cardiac perspective. Close AF clinic follow up made.   Meds for home Eliquis 5 mg BID (should NOT hold for at least 4 weeks for any procedures post DCCV) Diltiazem CD 240 mg daily Flecainide 50 mg BID Entresto 24-26 mg BID  EP will follow at a distance. Please call back if any questions. All above discussed with Dr. Georgiana Shore "[address] [place], [place]  11/01/2019 8:38 AM
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
## 2019-11-02 ENCOUNTER — Inpatient Hospital Stay (HOSPITAL_COMMUNITY): Payer: No Typology Code available for payment source

## 2019-11-02 DIAGNOSIS — I5022 Chronic systolic (congestive) heart failure: Secondary | ICD-10-CM

## 2019-11-02 LAB — BASIC METABOLIC PANEL
Anion gap: 9 (ref 5–15)
BUN: 14 mg/dL (ref 8–23)
CO2: 29 mmol/L (ref 22–32)
Calcium: 8.7 mg/dL — ABNORMAL LOW (ref 8.9–10.3)
Chloride: 100 mmol/L (ref 98–111)
Creatinine, Ser: 0.66 mg/dL (ref 0.44–1.00)
GFR calc Af Amer: >60 mL/min (ref >60)
GFR calc non Af Amer: >60 mL/min (ref >60)
Glucose, Bld: 114 mg/dL — ABNORMAL HIGH (ref 70–99)
Potassium: 3.1 mmol/L — ABNORMAL LOW (ref 3.5–5.1)
Sodium: 138 mmol/L (ref 135–145)

## 2019-11-02 LAB — SARS CORONAVIRUS 2 BY RT PCR (HOSPITAL ORDER, PERFORMED IN ~~LOC~~ HOSPITAL LAB): SARS Coronavirus 2: NEGATIVE

## 2019-11-02 MED ORDER — POTASSIUM CHLORIDE 10 MEQ/100ML IV SOLN
10.0000 meq | INTRAVENOUS | Status: AC
  Administered 2019-11-02 (×4): 10 meq via INTRAVENOUS
  Filled 2019-11-02 (×4): qty 100
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
## 2019-11-02 NOTE — Progress Notes (Signed)
Modified Barium Swallow Progress Note  Patient Details  Name: EZELL MELIKIAN MRN: [id number] Date of Birth: [birth year]  Today's Date: 11/02/2019  Modified Barium Swallow completed.  Full report located under Chart Review in the Imaging Section.  Brief recommendations include the following:  Clinical Impression  Pt presents with oropharyngeal dysphagia characterized by reduced bolus cohesion, reduced lingual retraction, reduced hyolaryngeal elevation, and reduced anterior laryngeal mobility. She demonstrated premature spillage to the valleculae, incomplete epiglottic inversion, vallecular residue, pyriform sinus residue, penetration, and aspiration. A puree bolus was necessary for transport of the 56mm barium tablet past the valleculae and additional puree boluses were needed for further transport through the thoracic esophagus. With thin liquids, penetration (PAS 3) was noted during the swallow and aspiration (PAS 7) was demonstrated after the swallow secondary to spillover of residue from the pyriform sinuses. Aspiration (PAS 7) during the swallow of honey thick liquids via cup was secondary to incomplete epiglottic inversion. Penetration (PAS 3) was observed with nectar thick liquids via straw but no laryngeal invasion was noted via cup. An effortful swallow was effective in reducing pharyngeal residue but penetration and aspiration of thin and honey thick liquid residue were still inconsistently noted. Pt reported that she was not symptomatic of aspiration prior to admission, but stated that she typically implements a chin tuck during swallowing; however, she is now unable to do this due to the [place] collar. It is therefore suspected that she does have some chronic dysphagia for which she was previously compensating. It is recommended that the dysphagia 3 diet with nectar thick liquids be continued with observance of swallowing precautions. SLP will follow for dysphagia treatment.    Swallow  Evaluation Recommendations       SLP Diet Recommendations: Dysphagia 3 (Mech soft) solids;Nectar thick liquid   Liquid Administration via: Cup;No straw   Medication Administration: Whole meds with puree   Supervision: Intermittent supervision to cue for compensatory strategies   Compensations: Slow rate;Small sips/bites;Follow solids with liquid   Postural Changes: Seated upright at 90 degrees   Oral Care Recommendations: Oral care BID;Patient independent with oral care   Other Recommendations: Order thickener from [place] [address] [place], [place], [place] Office number [phone] Pager [phone]  Horton Marshall 11/02/2019,2:08 PM

## 2019-11-02 NOTE — NC FL2 (Signed)
[place] LEVEL OF CARE SCREENING TOOL     IDENTIFICATION  Patient Name: Laura Lee Birthdate: [birth year] Sex: female Admission Date (Current Location): 10/26/2019  [facility] and [place] Number:  Herbalist and Address:  [facility] [place], [place] [address] [place], [place] [address]      Provider Number: [id number]  Attending Physician Name and Address:  Nolberto Hanlon, MD  Relative Name and Phone Number:  Carri Spillers    Current Level of Care: Hospital Recommended Level of Care: [place] Prior Approval Number:    Date Approved/Denied:   PASRR Number: [id number]  Discharge Plan: SNF    Current Diagnoses: Patient Active Problem List   Diagnosis Date Noted  . C2 cervical fracture ([place]) 10/26/2019  . A-fib ([place]) 10/26/2019  . Hypokalemia 10/26/2019  . Paroxysmal atrial fibrillation (HCC)   . Essential hypertension 05/26/2016  . CHF (congestive heart failure) ([place]) 02/04/2016  . Persistent atrial fibrillation ([place]) 02/04/2016    Orientation RESPIRATION BLADDER Height & Weight     Self, Time, Place, Situation  Normal Incontinent, External catheter Weight: 132 lb 14.4 oz (60.3 kg) Height:  5\' 6"  (167.6 cm)  BEHAVIORAL SYMPTOMS/MOOD NEUROLOGICAL BOWEL NUTRITION STATUS      Continent Diet (See discharge summary)  AMBULATORY STATUS COMMUNICATION OF NEEDS Skin   Extensive Assist Verbally Normal                       Personal Care Assistance Level of Assistance  Bathing, Dressing, Feeding Bathing Assistance: Limited assistance Feeding assistance: Independent Dressing Assistance: Limited assistance     Functional Limitations Info  Sight, Hearing, Speech Sight Info: Impaired Hearing Info: Impaired (Hearing Aid) Speech Info: Adequate    SPECIAL CARE FACTORS FREQUENCY  PT (By licensed PT), OT (By licensed OT)     PT Frequency: 5x a week OT Frequency: 5x a week            Contractures  Contractures Info: Not present    Additional Factors Info  Code Status, Allergies Code Status Info: DNR Allergies Info: Combigan (brimonidine Tartrate-timolol); Pneumococcal Vaccines           Current Medications (11/02/2019):  This is the current hospital active medication list Current Facility-Administered Medications  Medication Dose Route Frequency Provider Last Rate Last Admin  . acetaminophen (TYLENOL) tablet 650 mg  650 mg Oral Q6H PRN Dorothy Spark, MD   650 mg at 10/29/19 2017   Or  . acetaminophen (TYLENOL) suppository 650 mg  650 mg Rectal Q6H PRN Dorothy Spark, MD      . apixaban Arne Cleveland) tablet 5 mg  5 mg Oral BID Dorothy Spark, MD   5 mg at 11/02/19 0904  . bisacodyl (DULCOLAX) suppository 10 mg  10 mg Rectal Once Dorothy Spark, MD      . calcium-vitamin D (OSCAL WITH D) 500-200 MG-UNIT per tablet 1 tablet  1 tablet Oral BID Dorothy Spark, MD   1 tablet at 11/02/19 0904  . cholecalciferol (VITAMIN D3) tablet 400 Units  400 Units Oral Daily Dorothy Spark, MD   400 Units at 11/02/19 [phone]  . diltiazem (CARDIZEM CD) 24 hr capsule 240 mg  240 mg Oral Daily Dorothy Spark, MD   240 mg at 11/02/19 0904  . flecainide (TAMBOCOR) tablet 50 mg  50 mg Oral BID Dorothy Spark, MD   50 mg at 11/02/19 0907  . HYDROcodone-acetaminophen (  NORCO/VICODIN) 5-325 MG per tablet 1-2 tablet  1-2 tablet Oral Q4H PRN Dorothy Spark, MD   2 tablet at 11/02/19 [phone]  . HYDROmorphone (DILAUDID) injection 1 mg  1 mg Intravenous Q4H PRN Dorothy Spark, MD   1 mg at 11/02/19 1154  . hydroxypropyl methylcellulose / hypromellose (ISOPTO TEARS / GONIOVISC) 2.5 % ophthalmic solution 1 drop  1 drop Both Eyes TID PRN Dorothy Spark, MD      . latanoprost (XALATAN) 0.005 % ophthalmic solution 1 drop  1 drop Left Eye QHS Dorothy Spark, MD   1 drop at 11/01/19 2124  . levothyroxine (SYNTHROID) tablet 88 mcg  88 mcg Oral QAC breakfast Dorothy Spark, MD   88  mcg at 11/02/19 0441  . meloxicam (MOBIC) tablet 7.5 mg  7.5 mg Oral Daily PRN Dorothy Spark, MD      . morphine 2 MG/ML injection 2 mg  2 mg Intravenous Q4H PRN Dorothy Spark, MD   2 mg at 11/02/19 0228  . potassium chloride 10 mEq in 100 mL IVPB  10 mEq Intravenous Q1 Hr x 4 Amery, Sahar, MD 100 mL/hr at 11/02/19 1232 10 mEq at 11/02/19 1232  . Resource ThickenUp Clear   Oral PRN Nolberto Hanlon, MD      . sacubitril-valsartan (ENTRESTO) 24-26 mg per tablet  1 tablet Oral BID Dorothy Spark, MD   1 tablet at 11/02/19 0904  . senna-docusate (Senokot-S) tablet 1 tablet  1 tablet Oral BID Nolberto Hanlon, MD   1 tablet at 11/02/19 9811     Discharge Medications: Please see discharge summary for a list of discharge medications.  Relevant Imaging Results:  Relevant Lab Results:   Additional Information SSN [id number]  Arvella Merles, [place]
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
## 2019-11-03 DIAGNOSIS — S12100A Unspecified displaced fracture of second cervical vertebra, initial encounter for closed fracture: Secondary | ICD-10-CM

## 2019-11-03 DIAGNOSIS — I480 Paroxysmal atrial fibrillation: Secondary | ICD-10-CM

## 2019-11-03 DIAGNOSIS — I10 Essential (primary) hypertension: Secondary | ICD-10-CM

## 2019-11-03 LAB — BASIC METABOLIC PANEL
Anion gap: 5 (ref 5–15)
BUN: 12 mg/dL (ref 8–23)
CO2: 30 mmol/L (ref 22–32)
Calcium: 8.6 mg/dL — ABNORMAL LOW (ref 8.9–10.3)
Chloride: 101 mmol/L (ref 98–111)
Creatinine, Ser: 0.58 mg/dL (ref 0.44–1.00)
GFR calc Af Amer: >60 mL/min (ref >60)
GFR calc non Af Amer: >60 mL/min (ref >60)
Glucose, Bld: 100 mg/dL — ABNORMAL HIGH (ref 70–99)
Potassium: 3.6 mmol/L (ref 3.5–5.1)
Sodium: 136 mmol/L (ref 135–145)

## 2019-11-03 LAB — MRSA PCR SCREENING: MRSA by PCR: NEGATIVE

## 2019-11-03 MED ORDER — ACETAMINOPHEN 325 MG PO TABS: 650.0000 mg | ORAL_TABLET | Freq: Four times a day (QID) | ORAL | Status: DC | PRN

## 2019-11-03 MED ORDER — DILTIAZEM HCL ER COATED BEADS 240 MG PO CP24: 240.0000 mg | ORAL_CAPSULE | Freq: Every day | ORAL | 0 refills | Status: DC

## 2019-11-03 MED ORDER — SENNOSIDES-DOCUSATE SODIUM 8.6-50 MG PO TABS: 1.0000 | ORAL_TABLET | Freq: Two times a day (BID) | ORAL | Status: DC

## 2019-11-03 MED ORDER — FLECAINIDE ACETATE 50 MG PO TABS: 50.0000 mg | ORAL_TABLET | Freq: Two times a day (BID) | ORAL | 0 refills | Status: DC
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
## 2019-11-03 NOTE — TOC Transition Note (Signed)
Transition of Care [facility]) - CM/SW Discharge Note   Patient Details  Name: CALDONIA LEAP MRN: [id number] Date of Birth: 02-06-1932  [facility] CM/SW Contact:  Jacquelynn Cree Phone Number: 11/03/2019, 1:34 PM   Clinical Narrative:    Patient will DC to: [facility] Family notified: Shanon Brow, son Transport by: Corey Harold   Per MD patient ready for DC to . RN, patient, patient's family, and facility notified of DC. Discharge Summary and FL2 sent to facility. RN to call report prior to discharge [phone] ext [phone]. Room 15). DC packet on chart. Ambulance transport requested for patient.   CSW will sign off for now as social work intervention is no longer needed. Please consult [place] again if new needs arise.    Final next level of care: Skilled Nursing Facility Barriers to Discharge: No Barriers Identified   Patient Goals and CMS Choice   CMS Medicare.gov Compare Post Acute Care list provided to:: Patient Choice offered to / list presented to : Patient  Discharge Placement PASRR number recieved: 11/02/19            Patient [facility] bed at: [facility] Patient to be transferred to facility by: [place] Name of family member notified: Shanon Brow, son Patient and family notified of of transfer: 11/03/19  Discharge Plan and Services     Post Acute Care Choice: Home Health, [place]                               Social Determinants of Health (SDOH) Interventions     Readmission Risk Interventions No flowsheet data found.

## 2019-11-03 NOTE — Progress Notes (Signed)
Patient requesting pain medicine prescription, provider notified.

## 2019-11-03 NOTE — Care Management Important Message (Signed)
Important Message  Patient Details  Name: Laura Lee MRN: [id number] Date of Birth: February 28, 1932   Medicare Important Message Given:  Yes     Shelda Altes 11/03/2019, 12:07 PM
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
## 2019-11-03 NOTE — Progress Notes (Signed)
RN informed patient and daughter that PCP would need to be called for pain medication prescription. Patient and daughter are very upset about patient is not going home with prescription for pain meds. Refusing to transport to facility without medication. RN will call [facility], Daughter will be here early ro speak with attending about prescriptions.

## 2019-11-03 NOTE — Discharge Summary (Addendum)
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   StrictlyIdeas.no Fact Sheet for Healthcare Providers: BankingDealers.co.za This test is not yet approved or cleared  by the [place] FDA and has been authorized for detection and/or diagnosis of SARS-CoV-2 by FDA under an Emergency Use Authorization (EUA).  This EUA will remain in effect (meaning this test can be used) for the duration of the COVID-19 declaration under Section 564(b)(1) of the Act, 21 U.S.C. section 360bbb-3(b)(1), unless the authorization is terminated or revoked sooner. Performed at [facility], [address] [place], [place] [address]   SARS Coronavirus 2 by RT PCR (hospital order, performed in [facility] hospital lab) Nasopharyngeal Nasopharyngeal Swab     Status: None   Collection Time: 11/02/19  2:40 PM   Specimen: Nasopharyngeal Swab  Result Value Ref Range Status   SARS Coronavirus 2 NEGATIVE NEGATIVE Final    Comment: (NOTE) SARS-CoV-2 target nucleic acids are NOT DETECTED.  The SARS-CoV-2 RNA is generally detectable in upper and lower respiratory specimens during the acute phase of infection. The lowest concentration of SARS-CoV-2 viral copies this assay can detect is 250 copies / mL. A negative result does not preclude SARS-CoV-2 infection and should not be used as the sole basis for treatment or other patient management decisions.  A negative result may occur with improper specimen collection / handling, submission of specimen other than nasopharyngeal swab, presence of viral mutation(s) within the areas targeted by this assay, and inadequate number of viral copies (<250 copies / mL). A negative result must be combined with clinical observations, patient history, and epidemiological information.  Fact Sheet for Patients:   StrictlyIdeas.no  Fact Sheet for Healthcare  Providers: BankingDealers.co.za  This test is not yet approved or  cleared by the [place] and has been authorized for detection and/or diagnosis of SARS-CoV-2 by FDA under an Emergency Use Authorization (EUA).  This EUA will remain in effect (meaning this test can be used) for the duration of the COVID-19 declaration under Section 564(b)(1) of the Act, 21 U.S.C. section 360bbb-3(b)(1), unless the authorization is terminated or revoked sooner.  Performed at [facility], [place] [address] [place], [place] [address]   MRSA PCR Screening     Status: None   Collection Time: 11/03/19 12:44 AM   Specimen: Nasal Mucosa; Nasopharyngeal  Result Value Ref Range Status   MRSA by PCR NEGATIVE NEGATIVE Final    Comment:        The GeneXpert MRSA Assay (FDA approved for NASAL specimens only), is one component of a comprehensive MRSA colonization surveillance program. It is not intended to diagnose MRSA infection nor to guide or monitor treatment for MRSA infections. Performed at [facility], [place] [address] [place], [place] [address]      Time coordinating discharge: Over 30 minutes  SIGNED:   Nolberto Hanlon, MD  [facility] 11/04/2019, 8:16 AM Pager   If 7PM-7AM, please contact night-coverage [email or web address] Password [email or web address]

## 2019-11-03 NOTE — TOC Progression Note (Addendum)
Transition of Care [facility]) - Progression Note    Patient Details  Name: Laura Lee MRN: [id number] Date of Birth: Dec 13, 1931  Transition of Care [facility]) CM/SW [place], [place] Phone Number: 11/03/2019, 10:15 AM  Clinical Narrative:    Updated: Spoke with Otilio Carpen of [place] and informed patient is medically stable for discharge. CSW confirmed patient can return to SNF today. Discharge summary needs to be faxed to [phone]. CSW contacted patient's son Shanon Brow and sister Anderson Malta who was also present. CSW informed MD patient is confirmed to return.   CSW made telephone call to [facility] to confirm [facility] received and any additional discharge needs. CSW left a voicemail, awaiting a call back.   Expected Discharge Plan: [place] Barriers to Discharge: No Barriers Identified  Expected Discharge Plan and Services Expected Discharge Plan: [place] Choice: [place], [place] Living arrangements for the past 2 months: [place]                                       Social Determinants of Health (SDOH) Interventions    Readmission Risk Interventions No flowsheet data found.

## 2019-11-04 LAB — BASIC METABOLIC PANEL
Anion gap: 12 (ref 5–15)
BUN: 9 mg/dL (ref 8–23)
CO2: 26 mmol/L (ref 22–32)
Calcium: 8.4 mg/dL — ABNORMAL LOW (ref 8.9–10.3)
Chloride: 102 mmol/L (ref 98–111)
Creatinine, Ser: 0.56 mg/dL (ref 0.44–1.00)
GFR calc Af Amer: >60 mL/min (ref >60)
GFR calc non Af Amer: >60 mL/min (ref >60)
Glucose, Bld: 92 mg/dL (ref 70–99)
Potassium: 3.3 mmol/L — ABNORMAL LOW (ref 3.5–5.1)
Sodium: 140 mmol/L (ref 135–145)

## 2019-11-04 MED ORDER — HYDROCODONE-ACETAMINOPHEN 5-325 MG PO TABS: 1.0000 | ORAL_TABLET | ORAL | 0 refills | Status: DC | PRN

## 2019-11-04 NOTE — TOC Transition Note (Signed)
Transition of Care [facility]) - CM/SW Discharge Note   Patient Details  Name: Laura Lee MRN: [id number] Date of Birth: 01-Feb-1932  Transition [facility]) CM/SW Contact:  Bary Castilla, LCSW Phone Number: [phone] 11/04/2019, 8:55 AM   Clinical Narrative:     CSW was alerted that patient did not discharge yesterday due to transportation issue. CSW spoke with RN and confirmed that patient was ready for discharge this morning. CSW spoke with facility and faxed updated discharge summary.  Patient will DC to:?[place] Anticipated DC date:?11/04/19 Family notified:?[facility] SK:AJGO   Per MD patient ready for DC to [facility] RN, patient, patient's family, and facility notified of DC. Discharge Summary sent to facility. RN given number for report [phone] ext [phone]. Room 15. DC packet on chart. Ambulance transport requested for patient.   CSW signing off.   [place], [place] [phone]  Final next level of care: Skilled Nursing Facility Barriers to Discharge: No Barriers Identified   Patient Goals and CMS Choice   CMS Medicare.gov Compare Post Acute Care list provided to:: Patient Choice offered to / list presented to : Patient  Discharge Placement PASRR number recieved: 11/02/19            Patient chooses bed at: [facility] Patient to be transferred to facility by: [place] Name of family member notified: Shanon Brow, son Patient and family notified of of transfer: 11/03/19  Discharge Plan and Services     Post Acute Care Choice: Home Health, [place]                               Social Determinants of Health (SDOH) Interventions     Readmission Risk Interventions No flowsheet data found.

## 2019-11-04 NOTE — Progress Notes (Signed)
Spoke with SW, arranged transport pick up @ 1030am to [facility] SN[facility]. Report given to  [place], [place].

## 2019-11-04 NOTE — Progress Notes (Signed)
Patient did desat to 84% while sleeping, placed O2 at 1.5L via nasal cannula. O2 sats 98%. Removed O2 after about 2hrs and O2 sats WNL.

## 2019-11-06 ENCOUNTER — Non-Acute Institutional Stay (SKILLED_NURSING_FACILITY): Payer: Medicare Other | Admitting: Internal Medicine

## 2019-11-06 ENCOUNTER — Other Ambulatory Visit: Payer: Self-pay | Admitting: Internal Medicine

## 2019-11-06 ENCOUNTER — Encounter: Payer: Self-pay | Admitting: Internal Medicine

## 2019-11-06 DIAGNOSIS — R2689 Other abnormalities of gait and mobility: Secondary | ICD-10-CM | POA: Diagnosis not present

## 2019-11-06 DIAGNOSIS — I10 Essential (primary) hypertension: Secondary | ICD-10-CM | POA: Diagnosis not present

## 2019-11-06 DIAGNOSIS — I5022 Chronic systolic (congestive) heart failure: Secondary | ICD-10-CM

## 2019-11-06 DIAGNOSIS — R2681 Unsteadiness on feet: Secondary | ICD-10-CM | POA: Diagnosis not present

## 2019-11-06 DIAGNOSIS — S12100S Unspecified displaced fracture of second cervical vertebra, sequela: Secondary | ICD-10-CM | POA: Diagnosis not present

## 2019-11-06 DIAGNOSIS — I480 Paroxysmal atrial fibrillation: Secondary | ICD-10-CM | POA: Diagnosis not present

## 2019-11-06 DIAGNOSIS — R1312 Dysphagia, oropharyngeal phase: Secondary | ICD-10-CM | POA: Diagnosis not present

## 2019-11-06 DIAGNOSIS — M542 Cervicalgia: Secondary | ICD-10-CM | POA: Diagnosis not present

## 2019-11-06 DIAGNOSIS — I110 Hypertensive heart disease with heart failure: Secondary | ICD-10-CM | POA: Diagnosis not present

## 2019-11-06 DIAGNOSIS — I4891 Unspecified atrial fibrillation: Secondary | ICD-10-CM | POA: Diagnosis not present

## 2019-11-06 DIAGNOSIS — S21101S Unspecified open wound of right front wall of thorax without penetration into thoracic cavity, sequela: Secondary | ICD-10-CM | POA: Diagnosis not present

## 2019-11-06 DIAGNOSIS — M6281 Muscle weakness (generalized): Secondary | ICD-10-CM | POA: Diagnosis not present

## 2019-11-06 DIAGNOSIS — D492 Neoplasm of unspecified behavior of bone, soft tissue, and skin: Secondary | ICD-10-CM

## 2019-11-06 MED ORDER — OXYCODONE HCL 5 MG PO TABS: 5.0000 mg | ORAL_TABLET | Freq: Two times a day (BID) | ORAL | 0 refills | Status: DC
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
## 2019-11-07 DIAGNOSIS — M542 Cervicalgia: Secondary | ICD-10-CM | POA: Diagnosis not present

## 2019-11-07 DIAGNOSIS — I4891 Unspecified atrial fibrillation: Secondary | ICD-10-CM | POA: Diagnosis not present

## 2019-11-07 DIAGNOSIS — M6281 Muscle weakness (generalized): Secondary | ICD-10-CM | POA: Diagnosis not present

## 2019-11-07 DIAGNOSIS — S21101S Unspecified open wound of right front wall of thorax without penetration into thoracic cavity, sequela: Secondary | ICD-10-CM | POA: Diagnosis not present

## 2019-11-07 DIAGNOSIS — I110 Hypertensive heart disease with heart failure: Secondary | ICD-10-CM | POA: Diagnosis not present

## 2019-11-07 DIAGNOSIS — R2681 Unsteadiness on feet: Secondary | ICD-10-CM | POA: Diagnosis not present

## 2019-11-08 ENCOUNTER — Encounter (HOSPITAL_COMMUNITY): Payer: Medicare Other | Admitting: Nurse Practitioner

## 2019-11-08 ENCOUNTER — Non-Acute Institutional Stay (SKILLED_NURSING_FACILITY): Payer: Medicare Other | Admitting: Internal Medicine

## 2019-11-08 ENCOUNTER — Encounter: Payer: Self-pay | Admitting: Internal Medicine

## 2019-11-08 DIAGNOSIS — I5022 Chronic systolic (congestive) heart failure: Secondary | ICD-10-CM | POA: Diagnosis not present

## 2019-11-08 DIAGNOSIS — D492 Neoplasm of unspecified behavior of bone, soft tissue, and skin: Secondary | ICD-10-CM | POA: Diagnosis not present

## 2019-11-08 DIAGNOSIS — S12100S Unspecified displaced fracture of second cervical vertebra, sequela: Secondary | ICD-10-CM | POA: Diagnosis not present

## 2019-11-08 DIAGNOSIS — I4891 Unspecified atrial fibrillation: Secondary | ICD-10-CM | POA: Diagnosis not present

## 2019-11-08 DIAGNOSIS — S21101S Unspecified open wound of right front wall of thorax without penetration into thoracic cavity, sequela: Secondary | ICD-10-CM | POA: Diagnosis not present

## 2019-11-08 DIAGNOSIS — I480 Paroxysmal atrial fibrillation: Secondary | ICD-10-CM

## 2019-11-08 DIAGNOSIS — I110 Hypertensive heart disease with heart failure: Secondary | ICD-10-CM | POA: Diagnosis not present

## 2019-11-08 DIAGNOSIS — M542 Cervicalgia: Secondary | ICD-10-CM | POA: Diagnosis not present

## 2019-11-08 DIAGNOSIS — I10 Essential (primary) hypertension: Secondary | ICD-10-CM

## 2019-11-08 DIAGNOSIS — M6281 Muscle weakness (generalized): Secondary | ICD-10-CM | POA: Diagnosis not present

## 2019-11-08 DIAGNOSIS — R2681 Unsteadiness on feet: Secondary | ICD-10-CM | POA: Diagnosis not present
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
## 2019-11-09 DIAGNOSIS — R1312 Dysphagia, oropharyngeal phase: Secondary | ICD-10-CM | POA: Diagnosis not present

## 2019-11-09 DIAGNOSIS — D6869 Other thrombophilia: Secondary | ICD-10-CM | POA: Diagnosis not present

## 2019-11-09 DIAGNOSIS — R2689 Other abnormalities of gait and mobility: Secondary | ICD-10-CM | POA: Diagnosis not present

## 2019-11-09 DIAGNOSIS — M542 Cervicalgia: Secondary | ICD-10-CM | POA: Diagnosis not present

## 2019-11-09 DIAGNOSIS — M8580 Other specified disorders of bone density and structure, unspecified site: Secondary | ICD-10-CM | POA: Diagnosis not present

## 2019-11-09 DIAGNOSIS — I509 Heart failure, unspecified: Secondary | ICD-10-CM | POA: Diagnosis not present

## 2019-11-09 DIAGNOSIS — S12100G Unspecified displaced fracture of second cervical vertebra, subsequent encounter for fracture with delayed healing: Secondary | ICD-10-CM | POA: Diagnosis not present

## 2019-11-09 DIAGNOSIS — R4789 Other speech disturbances: Secondary | ICD-10-CM | POA: Diagnosis not present

## 2019-11-09 DIAGNOSIS — Z79890 Hormone replacement therapy (postmenopausal): Secondary | ICD-10-CM | POA: Diagnosis not present

## 2019-11-09 DIAGNOSIS — S12100S Unspecified displaced fracture of second cervical vertebra, sequela: Secondary | ICD-10-CM | POA: Diagnosis not present

## 2019-11-09 DIAGNOSIS — I5022 Chronic systolic (congestive) heart failure: Secondary | ICD-10-CM | POA: Diagnosis not present

## 2019-11-09 DIAGNOSIS — Z888 Allergy status to other drugs, medicaments and biological substances status: Secondary | ICD-10-CM | POA: Diagnosis not present

## 2019-11-09 DIAGNOSIS — I480 Paroxysmal atrial fibrillation: Secondary | ICD-10-CM | POA: Diagnosis not present

## 2019-11-09 DIAGNOSIS — S12100D Unspecified displaced fracture of second cervical vertebra, subsequent encounter for fracture with routine healing: Secondary | ICD-10-CM | POA: Diagnosis not present

## 2019-11-09 DIAGNOSIS — Z85850 Personal history of malignant neoplasm of thyroid: Secondary | ICD-10-CM | POA: Diagnosis not present

## 2019-11-09 DIAGNOSIS — M6281 Muscle weakness (generalized): Secondary | ICD-10-CM | POA: Diagnosis not present

## 2019-11-09 DIAGNOSIS — I4819 Other persistent atrial fibrillation: Secondary | ICD-10-CM | POA: Diagnosis not present

## 2019-11-09 DIAGNOSIS — R2681 Unsteadiness on feet: Secondary | ICD-10-CM | POA: Diagnosis not present

## 2019-11-09 DIAGNOSIS — E876 Hypokalemia: Secondary | ICD-10-CM | POA: Diagnosis not present

## 2019-11-09 DIAGNOSIS — Z853 Personal history of malignant neoplasm of breast: Secondary | ICD-10-CM | POA: Diagnosis not present

## 2019-11-09 DIAGNOSIS — Z803 Family history of malignant neoplasm of breast: Secondary | ICD-10-CM | POA: Diagnosis not present

## 2019-11-09 DIAGNOSIS — K5901 Slow transit constipation: Secondary | ICD-10-CM | POA: Diagnosis not present

## 2019-11-09 DIAGNOSIS — I10 Essential (primary) hypertension: Secondary | ICD-10-CM | POA: Diagnosis not present

## 2019-11-09 DIAGNOSIS — Z79899 Other long term (current) drug therapy: Secondary | ICD-10-CM | POA: Diagnosis not present

## 2019-11-09 DIAGNOSIS — I4891 Unspecified atrial fibrillation: Secondary | ICD-10-CM | POA: Diagnosis not present

## 2019-11-09 DIAGNOSIS — Z7901 Long term (current) use of anticoagulants: Secondary | ICD-10-CM | POA: Diagnosis not present

## 2019-11-09 DIAGNOSIS — R1310 Dysphagia, unspecified: Secondary | ICD-10-CM | POA: Diagnosis not present

## 2019-11-09 DIAGNOSIS — H409 Unspecified glaucoma: Secondary | ICD-10-CM | POA: Diagnosis not present

## 2019-11-09 DIAGNOSIS — E039 Hypothyroidism, unspecified: Secondary | ICD-10-CM | POA: Diagnosis not present

## 2019-11-09 LAB — BASIC METABOLIC PANEL
BUN: 14 (ref 4–21)
CO2: 30 — AB (ref 13–22)
Chloride: 101 (ref 99–108)
Creatinine: 0.7 (ref 0.5–1.1)
Glucose: 98
Potassium: 3.8 (ref 3.4–5.3)
Sodium: 139 (ref 137–147)

## 2019-11-09 LAB — COMPREHENSIVE METABOLIC PANEL
Albumin: 3.1 — AB (ref 3.5–5.0)
Calcium: 8.6 — AB (ref 8.7–10.7)
Globulin: 2.6

## 2019-11-09 LAB — CBC AND DIFFERENTIAL
HCT: 43 (ref 36–46)
Hemoglobin: 14 (ref 12.0–16.0)
Neutrophils Absolute: 8425
Platelets: 245 (ref 150–399)
WBC: 11.1

## 2019-11-09 LAB — HEPATIC FUNCTION PANEL
ALT: 40 — AB (ref 7–35)
AST: 25 (ref 13–35)
Alkaline Phosphatase: 153 — AB (ref 25–125)
Bilirubin, Total: 0.4

## 2019-11-09 LAB — TSH: TSH: 4.94 (ref 0.41–5.90)

## 2019-11-09 LAB — CBC: RBC: 4.84 (ref 3.87–5.11)

## 2019-11-13 ENCOUNTER — Non-Acute Institutional Stay (SKILLED_NURSING_FACILITY): Payer: Medicare Other | Admitting: Internal Medicine

## 2019-11-13 ENCOUNTER — Encounter: Payer: Self-pay | Admitting: Internal Medicine

## 2019-11-13 DIAGNOSIS — I480 Paroxysmal atrial fibrillation: Secondary | ICD-10-CM

## 2019-11-13 DIAGNOSIS — S12100S Unspecified displaced fracture of second cervical vertebra, sequela: Secondary | ICD-10-CM | POA: Diagnosis not present

## 2019-11-13 DIAGNOSIS — D492 Neoplasm of unspecified behavior of bone, soft tissue, and skin: Secondary | ICD-10-CM

## 2019-11-13 DIAGNOSIS — I10 Essential (primary) hypertension: Secondary | ICD-10-CM | POA: Diagnosis not present

## 2019-11-13 DIAGNOSIS — I5022 Chronic systolic (congestive) heart failure: Secondary | ICD-10-CM
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
## 2019-11-16 ENCOUNTER — Telehealth (HOSPITAL_COMMUNITY): Payer: Self-pay | Admitting: *Deleted

## 2019-11-16 LAB — HEPATIC FUNCTION PANEL
ALT: 28 (ref 7–35)
AST: 20 (ref 13–35)
Alkaline Phosphatase: 152 — AB (ref 25–125)
Bilirubin, Total: 0.5

## 2019-11-16 LAB — COMPREHENSIVE METABOLIC PANEL
Albumin: 3.3 — AB (ref 3.5–5.0)
Globulin: 2.8

## 2019-11-16 NOTE — Telephone Encounter (Signed)
ekg from [facility] performed since pt unable to travel to office due to severe pain per facility. EKG after starting flecainide 50mg  BID - per Roderic Palau NP ok compared to prior EKGs - can continue flecainide at current dose.  HR 70 SR w first degree av block . Will have EKG scanned in.

## 2019-11-20 ENCOUNTER — Non-Acute Institutional Stay (SKILLED_NURSING_FACILITY): Payer: Medicare Other | Admitting: Internal Medicine

## 2019-11-20 ENCOUNTER — Encounter: Payer: Self-pay | Admitting: Internal Medicine

## 2019-11-20 DIAGNOSIS — S12100S Unspecified displaced fracture of second cervical vertebra, sequela: Secondary | ICD-10-CM

## 2019-11-20 DIAGNOSIS — R1310 Dysphagia, unspecified: Secondary | ICD-10-CM | POA: Diagnosis not present

## 2019-11-20 DIAGNOSIS — I480 Paroxysmal atrial fibrillation: Secondary | ICD-10-CM | POA: Diagnosis not present

## 2019-11-20 DIAGNOSIS — I5022 Chronic systolic (congestive) heart failure: Secondary | ICD-10-CM
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
## 2019-11-30 ENCOUNTER — Encounter (HOSPITAL_COMMUNITY): Payer: Medicare Other | Admitting: Nurse Practitioner

## 2019-12-01 ENCOUNTER — Ambulatory Visit (HOSPITAL_COMMUNITY)
Admission: RE | Admit: 2019-12-01 | Discharge: 2019-12-01 | Disposition: A | Discharge status: Home / Self Care | Payer: No Typology Code available for payment source | Source: Ambulatory Visit | Attending: Nurse Practitioner | Admitting: Nurse Practitioner

## 2019-12-01 ENCOUNTER — Other Ambulatory Visit: Payer: Self-pay

## 2019-12-01 ENCOUNTER — Encounter (HOSPITAL_COMMUNITY): Payer: Self-pay | Admitting: Nurse Practitioner

## 2019-12-01 VITALS — BP 104/64 | HR 77 | Ht 66.0 in | Wt 130.6 lb

## 2019-12-01 DIAGNOSIS — Z853 Personal history of malignant neoplasm of breast: Secondary | ICD-10-CM | POA: Insufficient documentation

## 2019-12-01 DIAGNOSIS — D6869 Other thrombophilia: Secondary | ICD-10-CM

## 2019-12-01 DIAGNOSIS — Z803 Family history of malignant neoplasm of breast: Secondary | ICD-10-CM | POA: Diagnosis not present

## 2019-12-01 DIAGNOSIS — I4891 Unspecified atrial fibrillation: Secondary | ICD-10-CM | POA: Diagnosis not present

## 2019-12-01 DIAGNOSIS — Z888 Allergy status to other drugs, medicaments and biological substances status: Secondary | ICD-10-CM | POA: Insufficient documentation

## 2019-12-01 DIAGNOSIS — H409 Unspecified glaucoma: Secondary | ICD-10-CM | POA: Insufficient documentation

## 2019-12-01 DIAGNOSIS — Z79899 Other long term (current) drug therapy: Secondary | ICD-10-CM | POA: Insufficient documentation

## 2019-12-01 DIAGNOSIS — M8580 Other specified disorders of bone density and structure, unspecified site: Secondary | ICD-10-CM | POA: Insufficient documentation

## 2019-12-01 DIAGNOSIS — I10 Essential (primary) hypertension: Secondary | ICD-10-CM | POA: Diagnosis not present

## 2019-12-01 DIAGNOSIS — S12100D Unspecified displaced fracture of second cervical vertebra, subsequent encounter for fracture with routine healing: Secondary | ICD-10-CM | POA: Insufficient documentation

## 2019-12-01 DIAGNOSIS — Z79890 Hormone replacement therapy (postmenopausal): Secondary | ICD-10-CM | POA: Insufficient documentation

## 2019-12-01 DIAGNOSIS — Z85850 Personal history of malignant neoplasm of thyroid: Secondary | ICD-10-CM | POA: Diagnosis not present

## 2019-12-01 DIAGNOSIS — I4819 Other persistent atrial fibrillation: Secondary | ICD-10-CM | POA: Diagnosis not present

## 2019-12-01 DIAGNOSIS — Z7901 Long term (current) use of anticoagulants: Secondary | ICD-10-CM | POA: Diagnosis not present
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
## 2019-12-12 ENCOUNTER — Ambulatory Visit: Payer: Medicare Other | Admitting: Physician Assistant

## 2019-12-12 DIAGNOSIS — M542 Cervicalgia: Secondary | ICD-10-CM | POA: Diagnosis not present

## 2019-12-12 DIAGNOSIS — S12100G Unspecified displaced fracture of second cervical vertebra, subsequent encounter for fracture with delayed healing: Secondary | ICD-10-CM | POA: Diagnosis not present

## 2019-12-13 ENCOUNTER — Ambulatory Visit: Payer: Medicare Other | Admitting: Physician Assistant

## 2019-12-19 ENCOUNTER — Non-Acute Institutional Stay (SKILLED_NURSING_FACILITY): Payer: Medicare Other | Admitting: Nurse Practitioner

## 2019-12-19 ENCOUNTER — Encounter: Payer: Self-pay | Admitting: Nurse Practitioner

## 2019-12-19 DIAGNOSIS — R1310 Dysphagia, unspecified: Secondary | ICD-10-CM | POA: Diagnosis not present

## 2019-12-19 DIAGNOSIS — E039 Hypothyroidism, unspecified: Secondary | ICD-10-CM | POA: Insufficient documentation

## 2019-12-19 DIAGNOSIS — K5901 Slow transit constipation: Secondary | ICD-10-CM | POA: Insufficient documentation

## 2019-12-19 DIAGNOSIS — I10 Essential (primary) hypertension: Secondary | ICD-10-CM | POA: Diagnosis not present

## 2019-12-19 DIAGNOSIS — I5022 Chronic systolic (congestive) heart failure: Secondary | ICD-10-CM

## 2019-12-19 DIAGNOSIS — I480 Paroxysmal atrial fibrillation: Secondary | ICD-10-CM | POA: Diagnosis not present

## 2019-12-19 DIAGNOSIS — S12100S Unspecified displaced fracture of second cervical vertebra, sequela: Secondary | ICD-10-CM

## 2019-12-19 NOTE — Assessment & Plan Note (Signed)
Stable, continue Senokot S
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
## 2019-12-19 NOTE — Assessment & Plan Note (Signed)
Compensated, not taking diuretics.
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
## 2019-12-19 NOTE — Assessment & Plan Note (Addendum)
Hard collar is the culprit, s/p ST eval, diet modification, nectar thick liquids

## 2019-12-19 NOTE — Assessment & Plan Note (Signed)
Continue Levothyroxine, last TSH wnl

## 2019-12-19 NOTE — Assessment & Plan Note (Signed)
Blood pressure is controlled, continue Diltiazem.  

## 2019-12-19 NOTE — Assessment & Plan Note (Signed)
Afib, saw cardiology 12/01/19, stopped Amiodarone in the remote past due to fatigue. Takes Flecainide, s/p cardioversion, NSR, takes Eliquis, Diltiazem.

## 2020-01-02 DIAGNOSIS — S12100G Unspecified displaced fracture of second cervical vertebra, subsequent encounter for fracture with delayed healing: Secondary | ICD-10-CM | POA: Diagnosis not present

## 2020-01-05 ENCOUNTER — Other Ambulatory Visit: Payer: Self-pay

## 2020-01-05 ENCOUNTER — Encounter: Payer: Self-pay | Admitting: Nurse Practitioner

## 2020-01-05 ENCOUNTER — Non-Acute Institutional Stay (SKILLED_NURSING_FACILITY): Payer: Medicare Other | Admitting: Nurse Practitioner

## 2020-01-05 DIAGNOSIS — K5901 Slow transit constipation: Secondary | ICD-10-CM

## 2020-01-05 DIAGNOSIS — I480 Paroxysmal atrial fibrillation: Secondary | ICD-10-CM | POA: Diagnosis not present

## 2020-01-05 DIAGNOSIS — E039 Hypothyroidism, unspecified: Secondary | ICD-10-CM

## 2020-01-05 DIAGNOSIS — I10 Essential (primary) hypertension: Secondary | ICD-10-CM | POA: Diagnosis not present

## 2020-01-05 DIAGNOSIS — I5022 Chronic systolic (congestive) heart failure: Secondary | ICD-10-CM

## 2020-01-05 DIAGNOSIS — R1310 Dysphagia, unspecified: Secondary | ICD-10-CM

## 2020-01-05 DIAGNOSIS — S12100S Unspecified displaced fracture of second cervical vertebra, sequela: Secondary | ICD-10-CM | POA: Diagnosis not present

## 2020-01-05 NOTE — Assessment & Plan Note (Signed)
Chronic edema BLE, mild, otherwise compensated clinically, continue Entresto.

## 2020-01-05 NOTE — Assessment & Plan Note (Addendum)
Stable, continue Levothyroxine, TSH 4.94 11/09/19
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
## 2020-01-05 NOTE — Assessment & Plan Note (Signed)
Due to hard cervical collar.

## 2020-01-05 NOTE — Assessment & Plan Note (Addendum)
Afib, saw cardiology 12/01/19, stopped Amiodarone in the remote past due to fatigue. Heart rate is in control, continue  Flecainide, s/p cardioversion, NSR, continue Eliquis, Diltiazem.

## 2020-01-05 NOTE — Assessment & Plan Note (Signed)
Blood pressure is controlled, continue Diltiazem.  
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
## 2020-01-05 NOTE — Assessment & Plan Note (Signed)
Stable, continue Senokot.  

## 2020-01-11 DIAGNOSIS — I10 Essential (primary) hypertension: Secondary | ICD-10-CM | POA: Diagnosis not present

## 2020-01-11 DIAGNOSIS — M542 Cervicalgia: Secondary | ICD-10-CM | POA: Diagnosis not present

## 2020-01-11 DIAGNOSIS — E039 Hypothyroidism, unspecified: Secondary | ICD-10-CM | POA: Diagnosis not present

## 2020-01-11 DIAGNOSIS — I5020 Unspecified systolic (congestive) heart failure: Secondary | ICD-10-CM | POA: Diagnosis not present

## 2020-01-11 DIAGNOSIS — M170 Bilateral primary osteoarthritis of knee: Secondary | ICD-10-CM | POA: Diagnosis not present

## 2020-01-11 DIAGNOSIS — S12100D Unspecified displaced fracture of second cervical vertebra, subsequent encounter for fracture with routine healing: Secondary | ICD-10-CM | POA: Diagnosis not present

## 2020-01-11 DIAGNOSIS — I480 Paroxysmal atrial fibrillation: Secondary | ICD-10-CM | POA: Diagnosis not present

## 2020-01-11 DIAGNOSIS — R2689 Other abnormalities of gait and mobility: Secondary | ICD-10-CM | POA: Diagnosis not present

## 2020-01-11 DIAGNOSIS — H409 Unspecified glaucoma: Secondary | ICD-10-CM | POA: Diagnosis not present

## 2020-01-12 ENCOUNTER — Other Ambulatory Visit: Payer: Self-pay | Admitting: *Deleted

## 2020-01-12 MED ORDER — OXYCODONE HCL 5 MG PO TABS: 5.0000 mg | ORAL_TABLET | Freq: Two times a day (BID) | ORAL | 0 refills | Status: DC

## 2020-01-12 NOTE — Telephone Encounter (Signed)
Received refill Request Pended Rx and sent to Dr. Lyndel Safe for approval.

## 2020-01-15 ENCOUNTER — Non-Acute Institutional Stay: Payer: Medicare Other | Admitting: Nurse Practitioner

## 2020-01-15 ENCOUNTER — Encounter: Payer: Self-pay | Admitting: Nurse Practitioner

## 2020-01-15 DIAGNOSIS — L739 Follicular disorder, unspecified: Secondary | ICD-10-CM | POA: Diagnosis not present

## 2020-01-15 DIAGNOSIS — I5022 Chronic systolic (congestive) heart failure: Secondary | ICD-10-CM | POA: Diagnosis not present

## 2020-01-15 DIAGNOSIS — K5901 Slow transit constipation: Secondary | ICD-10-CM | POA: Diagnosis not present

## 2020-01-15 DIAGNOSIS — E039 Hypothyroidism, unspecified: Secondary | ICD-10-CM | POA: Diagnosis not present

## 2020-01-15 DIAGNOSIS — M542 Cervicalgia: Secondary | ICD-10-CM | POA: Diagnosis not present

## 2020-01-15 DIAGNOSIS — I480 Paroxysmal atrial fibrillation: Secondary | ICD-10-CM | POA: Diagnosis not present

## 2020-01-15 DIAGNOSIS — R1310 Dysphagia, unspecified: Secondary | ICD-10-CM | POA: Diagnosis not present

## 2020-01-15 DIAGNOSIS — S12100D Unspecified displaced fracture of second cervical vertebra, subsequent encounter for fracture with routine healing: Secondary | ICD-10-CM | POA: Diagnosis not present

## 2020-01-15 DIAGNOSIS — I10 Essential (primary) hypertension: Secondary | ICD-10-CM | POA: Diagnosis not present

## 2020-01-15 DIAGNOSIS — R2689 Other abnormalities of gait and mobility: Secondary | ICD-10-CM | POA: Diagnosis not present

## 2020-01-15 DIAGNOSIS — S12100S Unspecified displaced fracture of second cervical vertebra, sequela: Secondary | ICD-10-CM
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
## 2020-01-15 NOTE — Assessment & Plan Note (Signed)
Due to hard collar, on thin liquids.

## 2020-01-15 NOTE — Assessment & Plan Note (Signed)
a pus filled, reddened folliculitis about a pea sized, no pain, swelling, warmth, or decreased ROM in the left knee.  I+D, pus drained, will apply Bactroban oint bid x 10days, observe.

## 2020-01-15 NOTE — Assessment & Plan Note (Signed)
Blood pressure is controlled, continue Diltiazem.  
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
## 2020-01-15 NOTE — Assessment & Plan Note (Signed)
saw cardiology 12/01/19, stopped Amiodarone in the remote past due to fatigue. Takes Flecainide, Eliquis, Diltiazem. s/p cardioversion, NSR

## 2020-01-15 NOTE — Assessment & Plan Note (Signed)
Chronic edema BLE, trace, continue Entresto

## 2020-01-15 NOTE — Assessment & Plan Note (Signed)
Stable, continue Senokot S

## 2020-01-15 NOTE — Assessment & Plan Note (Signed)
Stable, continue Levothyroxine. TSH 4.94 10/2019

## 2020-01-16 ENCOUNTER — Encounter: Payer: Self-pay | Admitting: Nurse Practitioner

## 2020-01-17 DIAGNOSIS — M542 Cervicalgia: Secondary | ICD-10-CM | POA: Diagnosis not present

## 2020-01-17 DIAGNOSIS — S12100D Unspecified displaced fracture of second cervical vertebra, subsequent encounter for fracture with routine healing: Secondary | ICD-10-CM | POA: Diagnosis not present

## 2020-01-17 DIAGNOSIS — R2689 Other abnormalities of gait and mobility: Secondary | ICD-10-CM | POA: Diagnosis not present

## 2020-01-18 DIAGNOSIS — M542 Cervicalgia: Secondary | ICD-10-CM | POA: Diagnosis not present

## 2020-01-18 DIAGNOSIS — R2689 Other abnormalities of gait and mobility: Secondary | ICD-10-CM | POA: Diagnosis not present

## 2020-01-18 DIAGNOSIS — S12100D Unspecified displaced fracture of second cervical vertebra, subsequent encounter for fracture with routine healing: Secondary | ICD-10-CM | POA: Diagnosis not present

## 2020-01-22 DIAGNOSIS — R2689 Other abnormalities of gait and mobility: Secondary | ICD-10-CM | POA: Diagnosis not present

## 2020-01-22 DIAGNOSIS — M542 Cervicalgia: Secondary | ICD-10-CM | POA: Diagnosis not present

## 2020-01-22 DIAGNOSIS — S12100D Unspecified displaced fracture of second cervical vertebra, subsequent encounter for fracture with routine healing: Secondary | ICD-10-CM | POA: Diagnosis not present

## 2020-01-23 DIAGNOSIS — S12100D Unspecified displaced fracture of second cervical vertebra, subsequent encounter for fracture with routine healing: Secondary | ICD-10-CM | POA: Diagnosis not present

## 2020-01-23 DIAGNOSIS — M542 Cervicalgia: Secondary | ICD-10-CM | POA: Diagnosis not present

## 2020-01-23 DIAGNOSIS — R2689 Other abnormalities of gait and mobility: Secondary | ICD-10-CM | POA: Diagnosis not present

## 2020-01-24 DIAGNOSIS — S12100S Unspecified displaced fracture of second cervical vertebra, sequela: Secondary | ICD-10-CM | POA: Diagnosis not present

## 2020-01-24 DIAGNOSIS — M6281 Muscle weakness (generalized): Secondary | ICD-10-CM | POA: Diagnosis not present

## 2020-01-24 DIAGNOSIS — R2689 Other abnormalities of gait and mobility: Secondary | ICD-10-CM | POA: Diagnosis not present

## 2020-01-24 DIAGNOSIS — M542 Cervicalgia: Secondary | ICD-10-CM | POA: Diagnosis not present

## 2020-01-26 DIAGNOSIS — M6281 Muscle weakness (generalized): Secondary | ICD-10-CM | POA: Diagnosis not present

## 2020-01-26 DIAGNOSIS — R2689 Other abnormalities of gait and mobility: Secondary | ICD-10-CM | POA: Diagnosis not present

## 2020-01-26 DIAGNOSIS — M542 Cervicalgia: Secondary | ICD-10-CM | POA: Diagnosis not present

## 2020-01-26 DIAGNOSIS — S12100S Unspecified displaced fracture of second cervical vertebra, sequela: Secondary | ICD-10-CM | POA: Diagnosis not present
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
## 2020-01-29 DIAGNOSIS — S12100S Unspecified displaced fracture of second cervical vertebra, sequela: Secondary | ICD-10-CM | POA: Diagnosis not present

## 2020-01-29 DIAGNOSIS — R2689 Other abnormalities of gait and mobility: Secondary | ICD-10-CM | POA: Diagnosis not present

## 2020-01-29 DIAGNOSIS — M542 Cervicalgia: Secondary | ICD-10-CM | POA: Diagnosis not present

## 2020-01-29 DIAGNOSIS — M6281 Muscle weakness (generalized): Secondary | ICD-10-CM | POA: Diagnosis not present

## 2020-01-30 ENCOUNTER — Ambulatory Visit (INDEPENDENT_AMBULATORY_CARE_PROVIDER_SITE_OTHER): Payer: PRIVATE HEALTH INSURANCE | Admitting: Physician Assistant

## 2020-01-30 ENCOUNTER — Other Ambulatory Visit: Payer: Self-pay

## 2020-01-30 VITALS — BP 106/60 | HR 79 | Ht 66.0 in | Wt 141.0 lb

## 2020-01-30 DIAGNOSIS — I4819 Other persistent atrial fibrillation: Secondary | ICD-10-CM

## 2020-01-30 DIAGNOSIS — I10 Essential (primary) hypertension: Secondary | ICD-10-CM | POA: Diagnosis not present

## 2020-01-30 DIAGNOSIS — I428 Other cardiomyopathies: Secondary | ICD-10-CM | POA: Diagnosis not present

## 2020-01-30 NOTE — Patient Instructions (Signed)
Medication Instructions:   Your physician recommends that you continue on your current medications as directed. Please refer to the Current Medication list given to you today. *If you need a refill on your cardiac medications before your next appointment, please call your pharmacy*   Lab Work: [place]   If you have labs (blood work) drawn today and your tests are completely normal, you will receive your results only by: Marland Kitchen MyChart Message (if you have MyChart) OR . A paper copy in the mail If you have any lab test that is abnormal or we need to change your treatment, we will call you to review the results.   Testing/Procedures: NONE ORDERED  TODAY     Follow-Up: At [facility], you and your health needs are our priority.  As part of our continuing mission to provide you with exceptional heart care, we have created designated Provider Care Teams.  These Care Teams include your primary Cardiologist (physician) and Advanced Practice Providers (APPs -  Physician Assistants and Nurse Practitioners) who all work together to provide you with the care you need, when you need it.  We recommend signing up for the patient portal called "MyChart".  Sign up information is provided on this After Visit Summary.  MyChart is used to connect with patients for Virtual Visits (Telemedicine).  Patients are able to view lab/test results, encounter notes, upcoming appointments, etc.  Non-urgent messages can be sent to your provider as well.   To learn more about what you can do with MyChart, go to [email or web address].    Your next appointment:   6 month(s)  The format for your next appointment:   In Person  Provider:   You may see Dr. Curt Bears  or one of the following Advanced Practice Providers on your designated Care Team:    Chanetta Marshall, NP  Tommye Standard, PA-C  Legrand Como "Oda [place], [place]    Other Instructions

## 2020-01-31 DIAGNOSIS — R2689 Other abnormalities of gait and mobility: Secondary | ICD-10-CM | POA: Diagnosis not present

## 2020-01-31 DIAGNOSIS — S12100S Unspecified displaced fracture of second cervical vertebra, sequela: Secondary | ICD-10-CM | POA: Diagnosis not present

## 2020-01-31 DIAGNOSIS — M542 Cervicalgia: Secondary | ICD-10-CM | POA: Diagnosis not present

## 2020-01-31 DIAGNOSIS — M6281 Muscle weakness (generalized): Secondary | ICD-10-CM | POA: Diagnosis not present

## 2020-02-01 DIAGNOSIS — M6281 Muscle weakness (generalized): Secondary | ICD-10-CM | POA: Diagnosis not present

## 2020-02-01 DIAGNOSIS — M542 Cervicalgia: Secondary | ICD-10-CM | POA: Diagnosis not present

## 2020-02-01 DIAGNOSIS — S12100S Unspecified displaced fracture of second cervical vertebra, sequela: Secondary | ICD-10-CM | POA: Diagnosis not present

## 2020-02-01 DIAGNOSIS — R2689 Other abnormalities of gait and mobility: Secondary | ICD-10-CM | POA: Diagnosis not present

## 2020-02-02 DIAGNOSIS — M542 Cervicalgia: Secondary | ICD-10-CM | POA: Diagnosis not present

## 2020-02-02 DIAGNOSIS — S12100S Unspecified displaced fracture of second cervical vertebra, sequela: Secondary | ICD-10-CM | POA: Diagnosis not present

## 2020-02-02 DIAGNOSIS — R2689 Other abnormalities of gait and mobility: Secondary | ICD-10-CM | POA: Diagnosis not present

## 2020-02-02 DIAGNOSIS — M6281 Muscle weakness (generalized): Secondary | ICD-10-CM | POA: Diagnosis not present

## 2020-02-05 DIAGNOSIS — M542 Cervicalgia: Secondary | ICD-10-CM | POA: Diagnosis not present

## 2020-02-05 DIAGNOSIS — M6281 Muscle weakness (generalized): Secondary | ICD-10-CM | POA: Diagnosis not present

## 2020-02-05 DIAGNOSIS — S12100S Unspecified displaced fracture of second cervical vertebra, sequela: Secondary | ICD-10-CM | POA: Diagnosis not present

## 2020-02-05 DIAGNOSIS — R2689 Other abnormalities of gait and mobility: Secondary | ICD-10-CM | POA: Diagnosis not present

## 2020-02-06 DIAGNOSIS — R2689 Other abnormalities of gait and mobility: Secondary | ICD-10-CM | POA: Diagnosis not present

## 2020-02-06 DIAGNOSIS — M542 Cervicalgia: Secondary | ICD-10-CM | POA: Diagnosis not present

## 2020-02-06 DIAGNOSIS — M6281 Muscle weakness (generalized): Secondary | ICD-10-CM | POA: Diagnosis not present

## 2020-02-06 DIAGNOSIS — S12100S Unspecified displaced fracture of second cervical vertebra, sequela: Secondary | ICD-10-CM | POA: Diagnosis not present

## 2020-02-07 ENCOUNTER — Encounter: Payer: Self-pay | Admitting: Internal Medicine

## 2020-02-07 ENCOUNTER — Non-Acute Institutional Stay: Payer: Medicare Other | Admitting: Internal Medicine

## 2020-02-07 DIAGNOSIS — I5022 Chronic systolic (congestive) heart failure: Secondary | ICD-10-CM

## 2020-02-07 DIAGNOSIS — R1310 Dysphagia, unspecified: Secondary | ICD-10-CM | POA: Diagnosis not present

## 2020-02-07 DIAGNOSIS — I480 Paroxysmal atrial fibrillation: Secondary | ICD-10-CM | POA: Diagnosis not present

## 2020-02-07 DIAGNOSIS — M6281 Muscle weakness (generalized): Secondary | ICD-10-CM | POA: Diagnosis not present

## 2020-02-07 DIAGNOSIS — I10 Essential (primary) hypertension: Secondary | ICD-10-CM | POA: Diagnosis not present

## 2020-02-07 DIAGNOSIS — S12100S Unspecified displaced fracture of second cervical vertebra, sequela: Secondary | ICD-10-CM

## 2020-02-07 DIAGNOSIS — R2689 Other abnormalities of gait and mobility: Secondary | ICD-10-CM | POA: Diagnosis not present

## 2020-02-07 DIAGNOSIS — M542 Cervicalgia: Secondary | ICD-10-CM | POA: Diagnosis not present
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
## 2020-02-08 ENCOUNTER — Other Ambulatory Visit: Payer: Self-pay | Admitting: *Deleted

## 2020-02-08 DIAGNOSIS — D649 Anemia, unspecified: Secondary | ICD-10-CM | POA: Diagnosis not present

## 2020-02-08 DIAGNOSIS — M542 Cervicalgia: Secondary | ICD-10-CM | POA: Diagnosis not present

## 2020-02-08 DIAGNOSIS — R2689 Other abnormalities of gait and mobility: Secondary | ICD-10-CM | POA: Diagnosis not present

## 2020-02-08 DIAGNOSIS — F0390 Unspecified dementia without behavioral disturbance: Secondary | ICD-10-CM | POA: Diagnosis not present

## 2020-02-08 DIAGNOSIS — M6281 Muscle weakness (generalized): Secondary | ICD-10-CM | POA: Diagnosis not present

## 2020-02-08 DIAGNOSIS — S12100S Unspecified displaced fracture of second cervical vertebra, sequela: Secondary | ICD-10-CM | POA: Diagnosis not present

## 2020-02-08 MED ORDER — OXYCODONE HCL 5 MG PO TABS: 5.0000 mg | ORAL_TABLET | Freq: Two times a day (BID) | ORAL | 0 refills | Status: DC

## 2020-02-08 NOTE — Telephone Encounter (Signed)
Received Refill Request from [facility] Rx and sent to Dr. Lyndel Safe for approval

## 2020-02-09 DIAGNOSIS — M542 Cervicalgia: Secondary | ICD-10-CM | POA: Diagnosis not present

## 2020-02-09 DIAGNOSIS — R2689 Other abnormalities of gait and mobility: Secondary | ICD-10-CM | POA: Diagnosis not present

## 2020-02-09 DIAGNOSIS — M6281 Muscle weakness (generalized): Secondary | ICD-10-CM | POA: Diagnosis not present

## 2020-02-09 DIAGNOSIS — S12100S Unspecified displaced fracture of second cervical vertebra, sequela: Secondary | ICD-10-CM | POA: Diagnosis not present

## 2020-02-12 DIAGNOSIS — M6281 Muscle weakness (generalized): Secondary | ICD-10-CM | POA: Diagnosis not present

## 2020-02-12 DIAGNOSIS — M542 Cervicalgia: Secondary | ICD-10-CM | POA: Diagnosis not present

## 2020-02-12 DIAGNOSIS — S12100S Unspecified displaced fracture of second cervical vertebra, sequela: Secondary | ICD-10-CM | POA: Diagnosis not present

## 2020-02-12 DIAGNOSIS — R2689 Other abnormalities of gait and mobility: Secondary | ICD-10-CM | POA: Diagnosis not present

## 2020-02-13 DIAGNOSIS — M6281 Muscle weakness (generalized): Secondary | ICD-10-CM | POA: Diagnosis not present

## 2020-02-13 DIAGNOSIS — M542 Cervicalgia: Secondary | ICD-10-CM | POA: Diagnosis not present

## 2020-02-13 DIAGNOSIS — S12100S Unspecified displaced fracture of second cervical vertebra, sequela: Secondary | ICD-10-CM | POA: Diagnosis not present

## 2020-02-13 DIAGNOSIS — S12100G Unspecified displaced fracture of second cervical vertebra, subsequent encounter for fracture with delayed healing: Secondary | ICD-10-CM | POA: Diagnosis not present

## 2020-02-13 DIAGNOSIS — R2689 Other abnormalities of gait and mobility: Secondary | ICD-10-CM | POA: Diagnosis not present

## 2020-02-14 ENCOUNTER — Other Ambulatory Visit: Payer: Self-pay | Admitting: Neurosurgery

## 2020-02-14 DIAGNOSIS — M542 Cervicalgia: Secondary | ICD-10-CM | POA: Diagnosis not present

## 2020-02-14 DIAGNOSIS — M6281 Muscle weakness (generalized): Secondary | ICD-10-CM | POA: Diagnosis not present

## 2020-02-14 DIAGNOSIS — S12100G Unspecified displaced fracture of second cervical vertebra, subsequent encounter for fracture with delayed healing: Secondary | ICD-10-CM

## 2020-02-14 DIAGNOSIS — S12100S Unspecified displaced fracture of second cervical vertebra, sequela: Secondary | ICD-10-CM | POA: Diagnosis not present

## 2020-02-14 DIAGNOSIS — R2689 Other abnormalities of gait and mobility: Secondary | ICD-10-CM | POA: Diagnosis not present

## 2020-02-15 DIAGNOSIS — R2689 Other abnormalities of gait and mobility: Secondary | ICD-10-CM | POA: Diagnosis not present

## 2020-02-15 DIAGNOSIS — S12100S Unspecified displaced fracture of second cervical vertebra, sequela: Secondary | ICD-10-CM | POA: Diagnosis not present

## 2020-02-15 DIAGNOSIS — M6281 Muscle weakness (generalized): Secondary | ICD-10-CM | POA: Diagnosis not present

## 2020-02-15 DIAGNOSIS — M542 Cervicalgia: Secondary | ICD-10-CM | POA: Diagnosis not present

## 2020-02-20 ENCOUNTER — Telehealth: Payer: Self-pay

## 2020-02-20 DIAGNOSIS — M542 Cervicalgia: Secondary | ICD-10-CM | POA: Diagnosis not present

## 2020-02-20 DIAGNOSIS — R41841 Cognitive communication deficit: Secondary | ICD-10-CM | POA: Diagnosis not present

## 2020-02-20 DIAGNOSIS — R2681 Unsteadiness on feet: Secondary | ICD-10-CM | POA: Diagnosis not present

## 2020-02-20 DIAGNOSIS — S12100D Unspecified displaced fracture of second cervical vertebra, subsequent encounter for fracture with routine healing: Secondary | ICD-10-CM | POA: Diagnosis not present

## 2020-02-20 DIAGNOSIS — R293 Abnormal posture: Secondary | ICD-10-CM | POA: Diagnosis not present

## 2020-02-20 DIAGNOSIS — I5022 Chronic systolic (congestive) heart failure: Secondary | ICD-10-CM | POA: Diagnosis not present

## 2020-02-20 DIAGNOSIS — R2689 Other abnormalities of gait and mobility: Secondary | ICD-10-CM | POA: Diagnosis not present

## 2020-02-20 NOTE — Telephone Encounter (Signed)
Per Dr. Lyndel Safe, schedule patient to follow up in clinic in 4 weeks.   Right now only 5 weeks is available.   Called patient to make her aware, but no answer and unable to leave message.  Patient scheduled for.  Wednesday Mar 27, 2020  Appt at 2:00 PM (30 min)

## 2020-02-21 DIAGNOSIS — R2681 Unsteadiness on feet: Secondary | ICD-10-CM | POA: Diagnosis not present

## 2020-02-21 DIAGNOSIS — R2689 Other abnormalities of gait and mobility: Secondary | ICD-10-CM | POA: Diagnosis not present

## 2020-02-21 DIAGNOSIS — S12100D Unspecified displaced fracture of second cervical vertebra, subsequent encounter for fracture with routine healing: Secondary | ICD-10-CM | POA: Diagnosis not present

## 2020-02-21 DIAGNOSIS — I5022 Chronic systolic (congestive) heart failure: Secondary | ICD-10-CM | POA: Diagnosis not present

## 2020-02-21 DIAGNOSIS — R41841 Cognitive communication deficit: Secondary | ICD-10-CM | POA: Diagnosis not present

## 2020-02-21 DIAGNOSIS — R293 Abnormal posture: Secondary | ICD-10-CM | POA: Diagnosis not present

## 2020-02-21 NOTE — Telephone Encounter (Signed)
Attempted to discuss with the patient but she says she is hard of hearing and would prefer a letter in the mail.

## 2020-02-22 DIAGNOSIS — S12100D Unspecified displaced fracture of second cervical vertebra, subsequent encounter for fracture with routine healing: Secondary | ICD-10-CM | POA: Diagnosis not present

## 2020-02-22 DIAGNOSIS — R41841 Cognitive communication deficit: Secondary | ICD-10-CM | POA: Diagnosis not present

## 2020-02-22 DIAGNOSIS — R2689 Other abnormalities of gait and mobility: Secondary | ICD-10-CM | POA: Diagnosis not present

## 2020-02-22 DIAGNOSIS — I5022 Chronic systolic (congestive) heart failure: Secondary | ICD-10-CM | POA: Diagnosis not present

## 2020-02-22 DIAGNOSIS — R2681 Unsteadiness on feet: Secondary | ICD-10-CM | POA: Diagnosis not present

## 2020-02-22 DIAGNOSIS — R293 Abnormal posture: Secondary | ICD-10-CM | POA: Diagnosis not present

## 2020-02-22 NOTE — Telephone Encounter (Signed)
Letter mailed to patient.

## 2020-02-23 DIAGNOSIS — S12100D Unspecified displaced fracture of second cervical vertebra, subsequent encounter for fracture with routine healing: Secondary | ICD-10-CM | POA: Diagnosis not present

## 2020-02-23 DIAGNOSIS — R2681 Unsteadiness on feet: Secondary | ICD-10-CM | POA: Diagnosis not present

## 2020-02-23 DIAGNOSIS — R41841 Cognitive communication deficit: Secondary | ICD-10-CM | POA: Diagnosis not present

## 2020-02-23 DIAGNOSIS — M542 Cervicalgia: Secondary | ICD-10-CM | POA: Diagnosis not present

## 2020-02-23 DIAGNOSIS — R293 Abnormal posture: Secondary | ICD-10-CM | POA: Diagnosis not present

## 2020-02-23 DIAGNOSIS — I5022 Chronic systolic (congestive) heart failure: Secondary | ICD-10-CM | POA: Diagnosis not present

## 2020-02-23 DIAGNOSIS — R2689 Other abnormalities of gait and mobility: Secondary | ICD-10-CM | POA: Diagnosis not present

## 2020-02-26 DIAGNOSIS — R41841 Cognitive communication deficit: Secondary | ICD-10-CM | POA: Diagnosis not present

## 2020-02-26 DIAGNOSIS — M542 Cervicalgia: Secondary | ICD-10-CM | POA: Diagnosis not present

## 2020-02-26 DIAGNOSIS — R2689 Other abnormalities of gait and mobility: Secondary | ICD-10-CM | POA: Diagnosis not present

## 2020-02-26 DIAGNOSIS — R293 Abnormal posture: Secondary | ICD-10-CM | POA: Diagnosis not present

## 2020-02-26 DIAGNOSIS — I5022 Chronic systolic (congestive) heart failure: Secondary | ICD-10-CM | POA: Diagnosis not present

## 2020-02-26 DIAGNOSIS — R2681 Unsteadiness on feet: Secondary | ICD-10-CM | POA: Diagnosis not present

## 2020-02-27 DIAGNOSIS — R41841 Cognitive communication deficit: Secondary | ICD-10-CM | POA: Diagnosis not present

## 2020-02-27 DIAGNOSIS — R2681 Unsteadiness on feet: Secondary | ICD-10-CM | POA: Diagnosis not present

## 2020-02-27 DIAGNOSIS — I5022 Chronic systolic (congestive) heart failure: Secondary | ICD-10-CM | POA: Diagnosis not present

## 2020-02-27 DIAGNOSIS — R2689 Other abnormalities of gait and mobility: Secondary | ICD-10-CM | POA: Diagnosis not present

## 2020-02-27 DIAGNOSIS — M542 Cervicalgia: Secondary | ICD-10-CM | POA: Diagnosis not present

## 2020-02-27 DIAGNOSIS — R293 Abnormal posture: Secondary | ICD-10-CM | POA: Diagnosis not present

## 2020-02-28 ENCOUNTER — Ambulatory Visit
Admission: RE | Admit: 2020-02-28 | Discharge: 2020-02-28 | Disposition: A | Discharge status: Home / Self Care | Payer: Medicare Other | Source: Ambulatory Visit | Attending: Neurosurgery | Admitting: Neurosurgery

## 2020-02-28 ENCOUNTER — Other Ambulatory Visit: Payer: Self-pay

## 2020-02-28 DIAGNOSIS — S12120A Other displaced dens fracture, initial encounter for closed fracture: Secondary | ICD-10-CM | POA: Diagnosis not present

## 2020-02-28 DIAGNOSIS — I700 Atherosclerosis of aorta: Secondary | ICD-10-CM | POA: Diagnosis not present

## 2020-02-28 DIAGNOSIS — M5032 Other cervical disc degeneration, mid-cervical region, unspecified level: Secondary | ICD-10-CM | POA: Diagnosis not present

## 2020-02-28 DIAGNOSIS — S12100A Unspecified displaced fracture of second cervical vertebra, initial encounter for closed fracture: Secondary | ICD-10-CM | POA: Diagnosis not present

## 2020-02-28 DIAGNOSIS — S12100G Unspecified displaced fracture of second cervical vertebra, subsequent encounter for fracture with delayed healing: Secondary | ICD-10-CM

## 2020-02-29 DIAGNOSIS — R2689 Other abnormalities of gait and mobility: Secondary | ICD-10-CM | POA: Diagnosis not present

## 2020-02-29 DIAGNOSIS — R41841 Cognitive communication deficit: Secondary | ICD-10-CM | POA: Diagnosis not present

## 2020-02-29 DIAGNOSIS — I5022 Chronic systolic (congestive) heart failure: Secondary | ICD-10-CM | POA: Diagnosis not present

## 2020-02-29 DIAGNOSIS — R2681 Unsteadiness on feet: Secondary | ICD-10-CM | POA: Diagnosis not present

## 2020-02-29 DIAGNOSIS — R293 Abnormal posture: Secondary | ICD-10-CM | POA: Diagnosis not present

## 2020-02-29 DIAGNOSIS — M542 Cervicalgia: Secondary | ICD-10-CM | POA: Diagnosis not present

## 2020-03-01 DIAGNOSIS — R2681 Unsteadiness on feet: Secondary | ICD-10-CM | POA: Diagnosis not present

## 2020-03-01 DIAGNOSIS — R293 Abnormal posture: Secondary | ICD-10-CM | POA: Diagnosis not present

## 2020-03-01 DIAGNOSIS — R41841 Cognitive communication deficit: Secondary | ICD-10-CM | POA: Diagnosis not present

## 2020-03-01 DIAGNOSIS — M542 Cervicalgia: Secondary | ICD-10-CM | POA: Diagnosis not present

## 2020-03-01 DIAGNOSIS — R2689 Other abnormalities of gait and mobility: Secondary | ICD-10-CM | POA: Diagnosis not present

## 2020-03-01 DIAGNOSIS — I5022 Chronic systolic (congestive) heart failure: Secondary | ICD-10-CM | POA: Diagnosis not present

## 2020-03-04 ENCOUNTER — Other Ambulatory Visit: Payer: Self-pay

## 2020-03-04 DIAGNOSIS — R41841 Cognitive communication deficit: Secondary | ICD-10-CM | POA: Diagnosis not present

## 2020-03-04 DIAGNOSIS — R293 Abnormal posture: Secondary | ICD-10-CM | POA: Diagnosis not present

## 2020-03-04 DIAGNOSIS — R2689 Other abnormalities of gait and mobility: Secondary | ICD-10-CM | POA: Diagnosis not present

## 2020-03-04 DIAGNOSIS — I5022 Chronic systolic (congestive) heart failure: Secondary | ICD-10-CM | POA: Diagnosis not present

## 2020-03-04 DIAGNOSIS — M542 Cervicalgia: Secondary | ICD-10-CM | POA: Diagnosis not present

## 2020-03-04 DIAGNOSIS — R2681 Unsteadiness on feet: Secondary | ICD-10-CM | POA: Diagnosis not present

## 2020-03-04 MED ORDER — DILTIAZEM HCL 60 MG PO TABS
60.0000 mg | ORAL_TABLET | Freq: Four times a day (QID) | ORAL | 3 refills | Status: DC
Start: 2020-03-04 — End: 2020-08-23

## 2020-03-04 MED ORDER — ELIQUIS 5 MG PO TABS: 5.0000 mg | ORAL_TABLET | Freq: Two times a day (BID) | ORAL | 6 refills | Status: DC

## 2020-03-04 MED ORDER — FLECAINIDE ACETATE 50 MG PO TABS
50.0000 mg | ORAL_TABLET | Freq: Two times a day (BID) | ORAL | 3 refills | Status: DC
Start: 2020-03-04 — End: 2020-04-23

## 2020-03-04 NOTE — Telephone Encounter (Signed)
Pt last saw Laura [place], [place] 01/30/20, last labs 11/04/19 Creat 0.56, age 86, weight 63.3kg, based on specified criteria pt is on appropriate dosage of Eliquis 5mg  BID.  Will refill rx.

## 2020-03-04 NOTE — Telephone Encounter (Signed)
[facility] is requesting prescription because pt is new to their pharmacy.

## 2020-03-05 DIAGNOSIS — M542 Cervicalgia: Secondary | ICD-10-CM | POA: Diagnosis not present

## 2020-03-05 DIAGNOSIS — I5022 Chronic systolic (congestive) heart failure: Secondary | ICD-10-CM | POA: Diagnosis not present

## 2020-03-05 DIAGNOSIS — R293 Abnormal posture: Secondary | ICD-10-CM | POA: Diagnosis not present

## 2020-03-05 DIAGNOSIS — R41841 Cognitive communication deficit: Secondary | ICD-10-CM | POA: Diagnosis not present

## 2020-03-05 DIAGNOSIS — R2681 Unsteadiness on feet: Secondary | ICD-10-CM | POA: Diagnosis not present

## 2020-03-05 DIAGNOSIS — R2689 Other abnormalities of gait and mobility: Secondary | ICD-10-CM | POA: Diagnosis not present

## 2020-03-07 DIAGNOSIS — M542 Cervicalgia: Secondary | ICD-10-CM | POA: Diagnosis not present

## 2020-03-07 DIAGNOSIS — R293 Abnormal posture: Secondary | ICD-10-CM | POA: Diagnosis not present

## 2020-03-07 DIAGNOSIS — R41841 Cognitive communication deficit: Secondary | ICD-10-CM | POA: Diagnosis not present

## 2020-03-07 DIAGNOSIS — R2689 Other abnormalities of gait and mobility: Secondary | ICD-10-CM | POA: Diagnosis not present

## 2020-03-07 DIAGNOSIS — I5022 Chronic systolic (congestive) heart failure: Secondary | ICD-10-CM | POA: Diagnosis not present

## 2020-03-07 DIAGNOSIS — R2681 Unsteadiness on feet: Secondary | ICD-10-CM | POA: Diagnosis not present

## 2020-03-11 DIAGNOSIS — M542 Cervicalgia: Secondary | ICD-10-CM | POA: Diagnosis not present

## 2020-03-11 DIAGNOSIS — R2681 Unsteadiness on feet: Secondary | ICD-10-CM | POA: Diagnosis not present

## 2020-03-11 DIAGNOSIS — R293 Abnormal posture: Secondary | ICD-10-CM | POA: Diagnosis not present

## 2020-03-11 DIAGNOSIS — I5022 Chronic systolic (congestive) heart failure: Secondary | ICD-10-CM | POA: Diagnosis not present

## 2020-03-11 DIAGNOSIS — R2689 Other abnormalities of gait and mobility: Secondary | ICD-10-CM | POA: Diagnosis not present

## 2020-03-11 DIAGNOSIS — R41841 Cognitive communication deficit: Secondary | ICD-10-CM | POA: Diagnosis not present

## 2020-03-13 DIAGNOSIS — R293 Abnormal posture: Secondary | ICD-10-CM | POA: Diagnosis not present

## 2020-03-13 DIAGNOSIS — R41841 Cognitive communication deficit: Secondary | ICD-10-CM | POA: Diagnosis not present

## 2020-03-13 DIAGNOSIS — R2689 Other abnormalities of gait and mobility: Secondary | ICD-10-CM | POA: Diagnosis not present

## 2020-03-13 DIAGNOSIS — I5022 Chronic systolic (congestive) heart failure: Secondary | ICD-10-CM | POA: Diagnosis not present

## 2020-03-13 DIAGNOSIS — M542 Cervicalgia: Secondary | ICD-10-CM | POA: Diagnosis not present

## 2020-03-13 DIAGNOSIS — R2681 Unsteadiness on feet: Secondary | ICD-10-CM | POA: Diagnosis not present

## 2020-03-18 DIAGNOSIS — I5022 Chronic systolic (congestive) heart failure: Secondary | ICD-10-CM | POA: Diagnosis not present

## 2020-03-18 DIAGNOSIS — R2681 Unsteadiness on feet: Secondary | ICD-10-CM | POA: Diagnosis not present

## 2020-03-18 DIAGNOSIS — M542 Cervicalgia: Secondary | ICD-10-CM | POA: Diagnosis not present

## 2020-03-18 DIAGNOSIS — R41841 Cognitive communication deficit: Secondary | ICD-10-CM | POA: Diagnosis not present

## 2020-03-18 DIAGNOSIS — R293 Abnormal posture: Secondary | ICD-10-CM | POA: Diagnosis not present

## 2020-03-18 DIAGNOSIS — R2689 Other abnormalities of gait and mobility: Secondary | ICD-10-CM | POA: Diagnosis not present

## 2020-03-21 DIAGNOSIS — R293 Abnormal posture: Secondary | ICD-10-CM | POA: Diagnosis not present

## 2020-03-21 DIAGNOSIS — M542 Cervicalgia: Secondary | ICD-10-CM | POA: Diagnosis not present

## 2020-03-21 DIAGNOSIS — R41841 Cognitive communication deficit: Secondary | ICD-10-CM | POA: Diagnosis not present

## 2020-03-21 DIAGNOSIS — R2689 Other abnormalities of gait and mobility: Secondary | ICD-10-CM | POA: Diagnosis not present

## 2020-03-21 DIAGNOSIS — I5022 Chronic systolic (congestive) heart failure: Secondary | ICD-10-CM | POA: Diagnosis not present

## 2020-03-21 DIAGNOSIS — R2681 Unsteadiness on feet: Secondary | ICD-10-CM | POA: Diagnosis not present

## 2020-03-25 DIAGNOSIS — R293 Abnormal posture: Secondary | ICD-10-CM | POA: Diagnosis not present

## 2020-03-25 DIAGNOSIS — I5022 Chronic systolic (congestive) heart failure: Secondary | ICD-10-CM | POA: Diagnosis not present

## 2020-03-27 ENCOUNTER — Other Ambulatory Visit: Payer: Self-pay

## 2020-03-27 ENCOUNTER — Encounter: Payer: Self-pay | Admitting: Internal Medicine

## 2020-03-27 ENCOUNTER — Non-Acute Institutional Stay: Payer: Medicare Other | Admitting: Internal Medicine

## 2020-03-27 VITALS — BP 122/68 | HR 71 | Temp 97.6°F | Ht 66.0 in | Wt 138.6 lb

## 2020-03-27 DIAGNOSIS — E039 Hypothyroidism, unspecified: Secondary | ICD-10-CM | POA: Diagnosis not present

## 2020-03-27 DIAGNOSIS — S12100S Unspecified displaced fracture of second cervical vertebra, sequela: Secondary | ICD-10-CM | POA: Diagnosis not present

## 2020-03-27 DIAGNOSIS — R1310 Dysphagia, unspecified: Secondary | ICD-10-CM

## 2020-03-27 DIAGNOSIS — I5022 Chronic systolic (congestive) heart failure: Secondary | ICD-10-CM | POA: Diagnosis not present

## 2020-03-27 DIAGNOSIS — Z23 Encounter for immunization: Secondary | ICD-10-CM | POA: Diagnosis not present

## 2020-03-27 DIAGNOSIS — I480 Paroxysmal atrial fibrillation: Secondary | ICD-10-CM | POA: Diagnosis not present

## 2020-03-27 DIAGNOSIS — I10 Essential (primary) hypertension: Secondary | ICD-10-CM | POA: Diagnosis not present

## 2020-03-27 MED ORDER — HYDROCORTISONE ACETATE 25 MG RE SUPP: 25.0000 mg | Freq: Two times a day (BID) | RECTAL | 0 refills | Status: DC
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
## 2020-03-27 NOTE — Patient Instructions (Signed)
Insert Suppositories Twice a day for 6 days. If bleeding does not stop let our office know. Also Call us before you run out of your Oxycodone for renewal

## 2020-03-28 DIAGNOSIS — R293 Abnormal posture: Secondary | ICD-10-CM | POA: Diagnosis not present

## 2020-03-28 DIAGNOSIS — I5022 Chronic systolic (congestive) heart failure: Secondary | ICD-10-CM | POA: Diagnosis not present

## 2020-04-03 ENCOUNTER — Other Ambulatory Visit: Payer: Self-pay

## 2020-04-03 ENCOUNTER — Telehealth: Payer: Self-pay

## 2020-04-03 DIAGNOSIS — K644 Residual hemorrhoidal skin tags: Secondary | ICD-10-CM

## 2020-04-03 MED ORDER — HYDROCORTISONE ACETATE 25 MG RE SUPP: 25.0000 mg | Freq: Two times a day (BID) | RECTAL | 1 refills | Status: DC

## 2020-04-03 MED ORDER — HYDROCORTISONE ACETATE 25 MG RE SUPP
25.0000 mg | Freq: Two times a day (BID) | RECTAL | 1 refills | Status: DC
Start: 2020-04-03 — End: 2020-04-03

## 2020-04-03 NOTE — Telephone Encounter (Signed)
Patient called and told that Rx had been refilled.

## 2020-04-03 NOTE — Telephone Encounter (Signed)
Patient states she saw you last week for hemorrhoids and she was prescribed suppositories - Anusol HC x6 days and to call if no improvement. She states the bleeding has decreased but has not totally resolved.  She questions whether a few more days of Anusol would take care of it or if she needs another therapy.

## 2020-04-03 NOTE — Telephone Encounter (Signed)
Refill sent by provider to the wrong pharmacy. Rx resent to the pharmacy specified by the patient.

## 2020-04-03 NOTE — Telephone Encounter (Signed)
Yes that should be fine. I will renew her prescription

## 2020-04-08 DIAGNOSIS — Z23 Encounter for immunization: Secondary | ICD-10-CM | POA: Diagnosis not present

## 2020-04-11 ENCOUNTER — Telehealth: Payer: Self-pay | Admitting: Internal Medicine

## 2020-04-11 NOTE — Telephone Encounter (Signed)
Patient called and wanted me to tell you she has internal hemroids and the meds she had gotten are working she is not noticing as much blood if any and the medicine was anucordaz and she would like the prescription refilled at [facility].  Call. Pt back at [phone] she will be gone to [place] at 10 and if you dont get her she said just leave a message jas

## 2020-04-12 ENCOUNTER — Other Ambulatory Visit: Payer: Self-pay | Admitting: Internal Medicine

## 2020-04-12 DIAGNOSIS — K644 Residual hemorrhoidal skin tags: Secondary | ICD-10-CM

## 2020-04-12 MED ORDER — HYDROCORTISONE ACETATE 25 MG RE SUPP: 25.0000 mg | Freq: Two times a day (BID) | RECTAL | 1 refills | Status: DC

## 2020-04-12 NOTE — Telephone Encounter (Signed)
RX was approved 10 days ago with an additional refill. I will send to Virgie Dad, MD for further review

## 2020-04-15 ENCOUNTER — Other Ambulatory Visit: Payer: Self-pay | Admitting: Internal Medicine

## 2020-04-15 DIAGNOSIS — K644 Residual hemorrhoidal skin tags: Secondary | ICD-10-CM

## 2020-04-15 MED ORDER — OXYCODONE HCL 5 MG PO TABS
5.0000 mg | ORAL_TABLET | Freq: Two times a day (BID) | ORAL | 0 refills | Status: DC
Start: 2020-04-15 — End: 2020-06-03

## 2020-04-15 MED ORDER — HYDROCORTISONE ACETATE 25 MG RE SUPP: 25.0000 mg | Freq: Two times a day (BID) | RECTAL | 1 refills | Status: DC

## 2020-04-15 NOTE — Telephone Encounter (Signed)
Patient requested refills Epic LR: 02/08/2020 Pended Rx and sent to Dr. Lyndel Safe for approval.

## 2020-04-15 NOTE — Telephone Encounter (Signed)
Pt is on oxy once a day and she needs a new prescription to [facility] and she is on a cream for hemroids and she also needs a refill for that also.  Call Back [phone]

## 2020-04-23 ENCOUNTER — Ambulatory Visit (INDEPENDENT_AMBULATORY_CARE_PROVIDER_SITE_OTHER): Payer: PRIVATE HEALTH INSURANCE | Admitting: Cardiology

## 2020-04-23 ENCOUNTER — Other Ambulatory Visit: Payer: Self-pay

## 2020-04-23 ENCOUNTER — Encounter: Payer: Self-pay | Admitting: Cardiology

## 2020-04-23 VITALS — BP 110/80 | HR 115 | Ht 66.0 in | Wt 146.0 lb

## 2020-04-23 DIAGNOSIS — Z01812 Encounter for preprocedural laboratory examination: Secondary | ICD-10-CM

## 2020-04-23 DIAGNOSIS — I4819 Other persistent atrial fibrillation: Secondary | ICD-10-CM

## 2020-04-23 DIAGNOSIS — S12100G Unspecified displaced fracture of second cervical vertebra, subsequent encounter for fracture with delayed healing: Secondary | ICD-10-CM | POA: Diagnosis not present

## 2020-04-23 MED ORDER — FLECAINIDE ACETATE 100 MG PO TABS: 100.0000 mg | ORAL_TABLET | Freq: Two times a day (BID) | ORAL | 3 refills | Status: DC

## 2020-04-23 NOTE — Patient Instructions (Signed)
Medication Instructions:  Your physician has recommended you make the following change in your medication:  1. INCREASE Flecainide to 100 mg twice daily  *If you need a refill on your cardiac medications before your next appointment, please call your pharmacy*   Lab Work: Pre procedure labs today: BMET & CBC If you have labs (blood work) drawn today and your tests are completely normal, you will receive your results only by: Marland Kitchen MyChart Message (if you have MyChart) OR . A paper copy in the mail If you have any lab test that is abnormal or we need to change your treatment, we will call you to review the results.   Testing/Procedures: Your physician has recommended that you have a Cardioversion (DCCV). Electrical Cardioversion uses a jolt of electricity to your heart either through paddles or wired patches attached to your chest. This is a controlled, usually prescheduled, procedure. Defibrillation is done under light anesthesia in the hospital, and you usually go home the day of the procedure. This is done to get your heart back into a normal rhythm. You are not awake for the procedure. Please see the instructions below under "other instructions".  Follow-Up: At [facility], you and your health needs are our priority.  As part of our continuing mission to provide you with exceptional heart care, we have created designated Provider Care Teams.  These Care Teams include your primary Cardiologist (physician) and Advanced Practice Providers (APPs -  Physician Assistants and Nurse Practitioners) who all work together to provide you with the care you need, when you need it.   Your next appointment:   1 month(s) after your cardioversion on 12/9  The format for your next appointment:   In Person  Provider:   Tommye Standard, PA-C    Thank you for choosing [facility]!!   Trinidad Curet, RN [phone]   Other Instructions   You are scheduled for a Cardioversion on 05/02/20 with Dr.  Margaretann Loveless.  Please arrive at the [facility] (Main Entrance A) at [facility]: [address] [place], [place] [address] at 7:00 am. (1 hour prior to procedure unless lab work is needed; if lab work is needed arrive 1.5 hours ahead)  DIET: Nothing to eat or drink after midnight except a sip of water with medications (see medication instructions below)  Medication Instructions: Hold Entreto  Continue your anticoagulant: Eliquis You will need to continue your anticoagulant after your procedure until you are told by your provider that it is safe to stop   Labs: 11/30  You must have a responsible person to drive you home and stay in the waiting area during your procedure. Failure to do so could result in cancellation.  Bring your insurance cards.  *Special Note: Every effort is made to have your procedure done on time. Occasionally there are emergencies that occur at the hospital that may cause delays. Please be patient if a delay does occur.
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
## 2020-04-24 LAB — BASIC METABOLIC PANEL
BUN/Creatinine Ratio: 23 (ref 12–28)
BUN: 20 mg/dL (ref 8–27)
CO2: 26 mmol/L (ref 20–29)
Calcium: 9.2 mg/dL (ref 8.7–10.3)
Chloride: 102 mmol/L (ref 96–106)
Creatinine, Ser: 0.86 mg/dL (ref 0.57–1.00)
GFR calc Af Amer: 71 mL/min/{1.73_m2} (ref >59)
GFR calc non Af Amer: 62 mL/min/{1.73_m2} (ref >59)
Glucose: 79 mg/dL (ref 65–99)
Potassium: 4.2 mmol/L (ref 3.5–5.2)
Sodium: 142 mmol/L (ref 134–144)

## 2020-04-24 LAB — CBC
Hematocrit: 41.8 % (ref 34.0–46.6)
Hemoglobin: 14.2 g/dL (ref 11.1–15.9)
MCH: 30 pg (ref 26.6–33.0)
MCHC: 34 g/dL (ref 31.5–35.7)
MCV: 88 fL (ref 79–97)
RBC: 4.74 x10E6/uL (ref 3.77–5.28)
RDW: 12 % (ref 11.7–15.4)
WBC: 7.9 10*3/uL (ref 3.4–10.8)

## 2020-04-26 NOTE — Telephone Encounter (Signed)
hydrocortisone (ANUSOL-HC) 25 MG ordered by Dr. Lyndel Safe. Closing encounter

## 2020-04-30 ENCOUNTER — Other Ambulatory Visit (HOSPITAL_COMMUNITY)
Admission: RE | Admit: 2020-04-30 | Discharge: 2020-04-30 | Disposition: A | Discharge status: Home / Self Care | Payer: Medicare Other | Source: Ambulatory Visit | Attending: Internal Medicine | Admitting: Internal Medicine

## 2020-04-30 DIAGNOSIS — Z01812 Encounter for preprocedural laboratory examination: Secondary | ICD-10-CM | POA: Insufficient documentation

## 2020-04-30 DIAGNOSIS — Z20822 Contact with and (suspected) exposure to covid-19: Secondary | ICD-10-CM | POA: Insufficient documentation

## 2020-04-30 LAB — SARS CORONAVIRUS 2 (TAT 6-24 HRS): SARS Coronavirus 2: NEGATIVE

## 2020-05-01 NOTE — Anesthesia Preprocedure Evaluation (Addendum)
Anesthesia Evaluation  Patient identified by MRN, date of birth, ID band Patient awake    Reviewed: Allergy & Precautions, NPO status , Patient's Chart, lab work & pertinent test results  Airway Mallampati: III  TM Distance: >3 FB Neck ROM: Limited    Dental no notable dental hx. (+) Teeth Intact, Caps   Pulmonary neg pulmonary ROS,    Pulmonary exam normal breath sounds clear to auscultation       Cardiovascular Exercise Tolerance: Good hypertension, Pt. on medications +CHF  Normal cardiovascular exam+ dysrhythmias (on Eliquis) Atrial Fibrillation  Rhythm:Irregular Rate:Normal     Neuro/Psych Hx/o Glaucoma HOH negative psych ROS   GI/Hepatic negative GI ROS, Neg liver ROS,   Endo/Other  Hypothyroidism Hx/o breast and thyroid Ca  Renal/GU negative Renal ROS  negative genitourinary   Musculoskeletal Osteopenia   Abdominal   Peds negative pediatric ROS (+)  Hematology   Anesthesia Other Findings Breast cancer Thyroid cancer  Reproductive/Obstetrics                            Anesthesia Physical  Anesthesia Plan  ASA: III  Anesthesia Plan: General   Post-op Pain Management:    Induction: Intravenous  PONV Risk Score and Plan: 3 and Propofol infusion, TIVA and Treatment may vary due to age or medical condition  Airway Management Planned: Natural Airway and Mask  Additional Equipment:   Intra-op Plan:   Post-operative Plan:   Informed Consent: I have reviewed the patients History and Physical, chart, labs and discussed the procedure including the risks, benefits and alternatives for the proposed anesthesia with the patient or authorized representative who has indicated his/her understanding and acceptance.     Dental advisory given  Plan Discussed with: CRNA and Anesthesiologist  Anesthesia Plan Comments:         Anesthesia Quick Evaluation

## 2020-05-02 ENCOUNTER — Ambulatory Visit (HOSPITAL_COMMUNITY)
Admission: RE | Admit: 2020-05-02 | Discharge: 2020-05-02 | Disposition: A | Discharge status: Home / Self Care | Payer: Medicare Other | Attending: Internal Medicine | Admitting: Internal Medicine

## 2020-05-02 ENCOUNTER — Ambulatory Visit (HOSPITAL_COMMUNITY): Payer: Medicare Other | Admitting: Certified Registered Nurse Anesthetist

## 2020-05-02 ENCOUNTER — Encounter (HOSPITAL_COMMUNITY)
Admission: RE | Disposition: A | Discharge status: Home / Self Care | Payer: Self-pay | Source: Home / Self Care | Attending: Internal Medicine

## 2020-05-02 ENCOUNTER — Other Ambulatory Visit: Payer: Self-pay

## 2020-05-02 ENCOUNTER — Encounter (HOSPITAL_COMMUNITY): Payer: Self-pay | Admitting: Internal Medicine

## 2020-05-02 DIAGNOSIS — Z7901 Long term (current) use of anticoagulants: Secondary | ICD-10-CM | POA: Insufficient documentation

## 2020-05-02 DIAGNOSIS — E876 Hypokalemia: Secondary | ICD-10-CM | POA: Diagnosis not present

## 2020-05-02 DIAGNOSIS — Z79890 Hormone replacement therapy (postmenopausal): Secondary | ICD-10-CM | POA: Insufficient documentation

## 2020-05-02 DIAGNOSIS — I480 Paroxysmal atrial fibrillation: Secondary | ICD-10-CM | POA: Diagnosis not present

## 2020-05-02 DIAGNOSIS — I4819 Other persistent atrial fibrillation: Secondary | ICD-10-CM | POA: Diagnosis not present

## 2020-05-02 DIAGNOSIS — I428 Other cardiomyopathies: Secondary | ICD-10-CM | POA: Insufficient documentation

## 2020-05-02 DIAGNOSIS — Z79899 Other long term (current) drug therapy: Secondary | ICD-10-CM | POA: Insufficient documentation

## 2020-05-02 DIAGNOSIS — E039 Hypothyroidism, unspecified: Secondary | ICD-10-CM | POA: Diagnosis not present

## 2020-05-02 HISTORY — PX: CARDIOVERSION: SHX1299

## 2020-05-02 SURGERY — CARDIOVERSION
Anesthesia: General

## 2020-05-02 MED ORDER — LIDOCAINE HCL (CARDIAC) PF 100 MG/5ML IV SOSY
PREFILLED_SYRINGE | INTRAVENOUS | Status: DC | PRN
  Administered 2020-05-02: 80 mg via INTRAVENOUS

## 2020-05-02 MED ORDER — PROPOFOL 10 MG/ML IV BOLUS
INTRAVENOUS | Status: DC | PRN
  Administered 2020-05-02: 40 mg via INTRAVENOUS

## 2020-05-02 MED ORDER — SODIUM CHLORIDE 0.9 % IV SOLN
INTRAVENOUS | Status: AC | PRN
  Administered 2020-05-02: 20 mL via INTRAVENOUS

## 2020-05-02 MED ORDER — APIXABAN 5 MG PO TABS
5.0000 mg | ORAL_TABLET | Freq: Once | ORAL | Status: AC
  Administered 2020-05-02: 5 mg via ORAL
  Filled 2020-05-02: qty 1

## 2020-05-02 NOTE — CV Procedure (Signed)
Procedure: Electrical Cardioversion Indications:  Atrial Fibrillation  Procedure Details:  Consent: Risks of procedure as well as the alternatives and risks of each were explained to the (patient/caregiver).  Consent for procedure obtained.  Time Out: Verified patient identification, verified procedure, site/side was marked, verified correct patient position, special equipment/implants available, medications/allergies/relevent history reviewed, required imaging and test results available. PERFORMED.  Patient placed on cardiac monitor, pulse oximetry, supplemental oxygen as necessary.  Sedation given: propofol/lidocaine per anesthesia. Pacer pads placed anterior and posterior chest.  Cardioverted 1 time(s).  Cardioversion with synchronized biphasic 120J shock.  Evaluation: Findings: Post procedure EKG shows: NSR Complications: None Patient did tolerate procedure well.  Time Spent Directly with the Patient:  20 minutes   Elouise Munroe 05/02/2020, 8:02 AM

## 2020-05-02 NOTE — Anesthesia Postprocedure Evaluation (Signed)
Anesthesia Post Note  Patient: Laura Lee  Procedure(s) Performed: CARDIOVERSION (N/A )     Patient location during evaluation: PACU Anesthesia Type: General Level of consciousness: awake Pain management: pain level controlled Vital Signs Assessment: post-procedure vital signs reviewed and stable Respiratory status: spontaneous breathing and respiratory function stable Cardiovascular status: stable Postop Assessment: no apparent nausea or vomiting Anesthetic complications: no   No complications documented.  Last Vitals:  Vitals:   05/02/20 0820 05/02/20 0830  BP: (!) 91/58   Pulse: 69 66  Resp: (!) 8 12  Temp:    SpO2: 95% 97%    Last Pain:  Vitals:   05/02/20 0830  TempSrc:   PainSc: 0-No pain                 Merlinda Frederick

## 2020-05-02 NOTE — Anesthesia Procedure Notes (Signed)
Procedure Name: General with mask airway Date/Time: 05/02/2020 7:58 AM Performed by: Colin Benton, CRNA Pre-anesthesia Checklist: Patient identified, Emergency Drugs available, Patient being monitored and Suction available Patient Re-evaluated:Patient Re-evaluated prior to induction Oxygen Delivery Method: Ambu bag Preoxygenation: Pre-oxygenation with 100% oxygen Induction Type: IV induction Placement Confirmation: positive ETCO2 Dental Injury: Teeth and Oropharynx as per pre-operative assessment

## 2020-05-02 NOTE — Interval H&P Note (Signed)
History and Physical Interval Note:  05/02/2020 7:58 AM  Laura Lee  has presented today for surgery, with the diagnosis of AFIB.  The various methods of treatment have been discussed with the patient and family. After consideration of risks, benefits and other options for treatment, the patient has consented to  Procedure(s): CARDIOVERSION (N/A) as a surgical intervention.  The patient's history has been reviewed, patient examined, no change in status, stable for surgery.  I have reviewed the patient's chart and labs.  Questions were answered to the patient's satisfaction.     Laura Lee

## 2020-05-02 NOTE — Transfer of Care (Signed)
Immediate Anesthesia Transfer of Care Note  Patient: Laura Lee  Procedure(s) Performed: CARDIOVERSION (N/A )  Patient Location: Endoscopy Unit  Anesthesia Type:MAC  Level of Consciousness: drowsy  Airway & Oxygen Therapy: Patient Spontanous Breathing  Post-op Assessment: Report given to RN and Post -op Vital signs reviewed and stable  Post vital signs: Reviewed and stable  Last Vitals:  Vitals Value Taken Time  BP    Temp    Pulse    Resp    SpO2      Last Pain:  Vitals:   05/02/20 0714  TempSrc: Oral  PainSc: 0-No pain         Complications: No complications documented.

## 2020-05-03 ENCOUNTER — Ambulatory Visit: Payer: Medicare Other | Admitting: Physician Assistant

## 2020-05-03 NOTE — Progress Notes (Signed)
NO ANSWER, UNABLE TO LEAVE VOICE MAIL

## 2020-05-05 ENCOUNTER — Encounter (HOSPITAL_COMMUNITY): Payer: Self-pay | Admitting: Internal Medicine

## 2020-05-29 DIAGNOSIS — I10 Essential (primary) hypertension: Secondary | ICD-10-CM | POA: Diagnosis not present

## 2020-05-29 DIAGNOSIS — M170 Bilateral primary osteoarthritis of knee: Secondary | ICD-10-CM | POA: Diagnosis not present

## 2020-05-29 DIAGNOSIS — I5020 Unspecified systolic (congestive) heart failure: Secondary | ICD-10-CM | POA: Diagnosis not present

## 2020-05-29 DIAGNOSIS — I480 Paroxysmal atrial fibrillation: Secondary | ICD-10-CM | POA: Diagnosis not present

## 2020-05-29 DIAGNOSIS — E039 Hypothyroidism, unspecified: Secondary | ICD-10-CM | POA: Diagnosis not present

## 2020-05-29 DIAGNOSIS — H409 Unspecified glaucoma: Secondary | ICD-10-CM | POA: Diagnosis not present

## 2020-06-03 ENCOUNTER — Other Ambulatory Visit: Payer: Self-pay | Admitting: Internal Medicine

## 2020-06-03 NOTE — Telephone Encounter (Signed)
Patient has request refill on medication Oxycodone 5mg . Patient last refill was 04/15/2020 with 60 tablets to be taken twice daily. Medication pend and sent to PCP Virgie Dad, MD . Please Advise.

## 2020-06-04 ENCOUNTER — Ambulatory Visit (INDEPENDENT_AMBULATORY_CARE_PROVIDER_SITE_OTHER): Payer: PRIVATE HEALTH INSURANCE | Admitting: Cardiology

## 2020-06-04 ENCOUNTER — Encounter: Payer: Self-pay | Admitting: Cardiology

## 2020-06-04 ENCOUNTER — Other Ambulatory Visit: Payer: Self-pay

## 2020-06-04 VITALS — BP 118/72 | HR 73 | Ht 66.0 in | Wt 140.0 lb

## 2020-06-04 DIAGNOSIS — I4819 Other persistent atrial fibrillation: Secondary | ICD-10-CM | POA: Diagnosis not present

## 2020-06-04 NOTE — Patient Instructions (Addendum)
Medication Instructions:  Your physician recommends that you continue on your current medications as directed. Please refer to the Current Medication list given to you today.  *If you need a refill on your cardiac medications before your next appointment, please call your pharmacy*   Lab Work: None ordered  Testing/Procedures: None ordered   Follow-Up: At [facility], you and your health needs are our priority.  As part of our continuing mission to provide you with exceptional heart care, we have created designated Provider Care Teams.  These Care Teams include your primary Cardiologist (physician) and Advanced Practice Providers (APPs -  Physician Assistants and Nurse Practitioners) who all work together to provide you with the care you need, when you need it.  Your next appointment:   6 month(s)  The format for your next appointment:   In Person  Provider:   Will Camnitz, MD    Thank you for choosing [facility]!!   Ajay Strubel, RN [phone]   Other Instructions     
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
## 2020-06-17 ENCOUNTER — Other Ambulatory Visit: Payer: Self-pay | Admitting: Cardiology

## 2020-07-04 ENCOUNTER — Other Ambulatory Visit: Payer: Self-pay

## 2020-07-04 DIAGNOSIS — I480 Paroxysmal atrial fibrillation: Secondary | ICD-10-CM | POA: Diagnosis not present

## 2020-07-04 DIAGNOSIS — I10 Essential (primary) hypertension: Secondary | ICD-10-CM | POA: Diagnosis not present

## 2020-07-04 DIAGNOSIS — I5022 Chronic systolic (congestive) heart failure: Secondary | ICD-10-CM

## 2020-07-04 DIAGNOSIS — E039 Hypothyroidism, unspecified: Secondary | ICD-10-CM

## 2020-07-04 LAB — COMPLETE METABOLIC PANEL WITH GFR
AG Ratio: 1.1 (calc) (ref 1.0–2.5)
ALT: 9 U/L (ref 6–29)
AST: 18 U/L (ref 10–35)
Albumin: 3.8 g/dL (ref 3.6–5.1)
Alkaline phosphatase (APISO): 66 U/L (ref 37–153)
BUN/Creatinine Ratio: 19 (calc) (ref 6–22)
BUN: 21 mg/dL (ref 7–25)
CO2: 26 mmol/L (ref 20–32)
Calcium: 9.6 mg/dL (ref 8.6–10.4)
Chloride: 101 mmol/L (ref 98–110)
Creat: 1.12 mg/dL — ABNORMAL HIGH (ref 0.60–0.88)
GFR, Est African American: 52 mL/min/{1.73_m2} — ABNORMAL LOW (ref >=60)
GFR, Est Non African American: 44 mL/min/{1.73_m2} — ABNORMAL LOW (ref >=60)
Globulin: 3.4 g/dL (calc) (ref 1.9–3.7)
Glucose, Bld: 86 mg/dL (ref 65–99)
Potassium: 4.2 mmol/L (ref 3.5–5.3)
Sodium: 138 mmol/L (ref 135–146)
Total Bilirubin: 0.5 mg/dL (ref 0.2–1.2)
Total Protein: 7.2 g/dL (ref 6.1–8.1)

## 2020-07-04 LAB — CBC WITH DIFFERENTIAL/PLATELET
Absolute Monocytes: 956 cells/uL — ABNORMAL HIGH (ref 200–950)
Basophils Absolute: 73 cells/uL (ref 0–200)
Basophils Relative: 0.8 %
Eosinophils Absolute: 228 cells/uL (ref 15–500)
Eosinophils Relative: 2.5 %
HCT: 44.5 % (ref 35.0–45.0)
Hemoglobin: 14.3 g/dL (ref 11.7–15.5)
Lymphs Abs: 2894 cells/uL (ref 850–3900)
MCH: 27.7 pg (ref 27.0–33.0)
MCHC: 32.1 g/dL (ref 32.0–36.0)
MCV: 86.2 fL (ref 80.0–100.0)
MPV: 11.4 fL (ref 7.5–12.5)
Monocytes Relative: 10.5 %
Neutro Abs: 4950 cells/uL (ref 1500–7800)
Neutrophils Relative %: 54.4 %
Platelets: 63 10*3/uL — ABNORMAL LOW (ref 140–400)
RBC: 5.16 10*6/uL — ABNORMAL HIGH (ref 3.80–5.10)
RDW: 13.9 % (ref 11.0–15.0)
Total Lymphocyte: 31.8 %
WBC: 9.1 10*3/uL (ref 3.8–10.8)

## 2020-07-04 LAB — TSH: TSH: 2.81 mIU/L (ref 0.40–4.50)

## 2020-07-10 ENCOUNTER — Non-Acute Institutional Stay: Payer: Medicare Other | Admitting: Internal Medicine

## 2020-07-10 ENCOUNTER — Other Ambulatory Visit: Payer: Self-pay

## 2020-07-10 ENCOUNTER — Encounter: Payer: Self-pay | Admitting: Internal Medicine

## 2020-07-10 VITALS — BP 116/80 | HR 95 | Temp 97.0°F | Ht 66.0 in | Wt 144.0 lb

## 2020-07-10 DIAGNOSIS — S12100S Unspecified displaced fracture of second cervical vertebra, sequela: Secondary | ICD-10-CM

## 2020-07-10 DIAGNOSIS — R1310 Dysphagia, unspecified: Secondary | ICD-10-CM

## 2020-07-10 DIAGNOSIS — E039 Hypothyroidism, unspecified: Secondary | ICD-10-CM

## 2020-07-10 DIAGNOSIS — I480 Paroxysmal atrial fibrillation: Secondary | ICD-10-CM

## 2020-07-10 DIAGNOSIS — I10 Essential (primary) hypertension: Secondary | ICD-10-CM

## 2020-07-10 DIAGNOSIS — D696 Thrombocytopenia, unspecified: Secondary | ICD-10-CM | POA: Diagnosis not present

## 2020-07-10 DIAGNOSIS — N1831 Chronic kidney disease, stage 3a: Secondary | ICD-10-CM | POA: Diagnosis not present
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
## 2020-07-10 NOTE — Patient Instructions (Signed)
Call the office at [phone] if you would like an order for TDAP vaccine sent to a pharmacy to get.

## 2020-07-11 ENCOUNTER — Other Ambulatory Visit: Payer: Self-pay

## 2020-07-11 ENCOUNTER — Non-Acute Institutional Stay: Payer: Medicare Other

## 2020-07-11 DIAGNOSIS — D696 Thrombocytopenia, unspecified: Secondary | ICD-10-CM | POA: Diagnosis not present

## 2020-07-11 DIAGNOSIS — N1831 Chronic kidney disease, stage 3a: Secondary | ICD-10-CM | POA: Diagnosis not present

## 2020-07-11 LAB — COMPLETE METABOLIC PANEL WITH GFR
AG Ratio: 1.2 (calc) (ref 1.0–2.5)
ALT: 9 U/L (ref 6–29)
AST: 14 U/L (ref 10–35)
Albumin: 3.7 g/dL (ref 3.6–5.1)
Alkaline phosphatase (APISO): 66 U/L (ref 37–153)
BUN/Creatinine Ratio: 18 (calc) (ref 6–22)
BUN: 19 mg/dL (ref 7–25)
CO2: 29 mmol/L (ref 20–32)
Calcium: 8.8 mg/dL (ref 8.6–10.4)
Chloride: 103 mmol/L (ref 98–110)
Creat: 1.05 mg/dL — ABNORMAL HIGH (ref 0.60–0.88)
GFR, Est African American: 56 mL/min/{1.73_m2} — ABNORMAL LOW (ref >=60)
GFR, Est Non African American: 48 mL/min/{1.73_m2} — ABNORMAL LOW (ref >=60)
Globulin: 3 g/dL (calc) (ref 1.9–3.7)
Glucose, Bld: 62 mg/dL — ABNORMAL LOW (ref 65–99)
Potassium: 4.1 mmol/L (ref 3.5–5.3)
Sodium: 140 mmol/L (ref 135–146)
Total Bilirubin: 0.6 mg/dL (ref 0.2–1.2)
Total Protein: 6.7 g/dL (ref 6.1–8.1)

## 2020-07-11 LAB — CBC WITH DIFFERENTIAL/PLATELET
Absolute Monocytes: 878 cells/uL (ref 200–950)
Basophils Absolute: 58 cells/uL (ref 0–200)
Basophils Relative: 0.8 %
Eosinophils Absolute: 259 cells/uL (ref 15–500)
Eosinophils Relative: 3.6 %
HCT: 42.8 % (ref 35.0–45.0)
Hemoglobin: 13.8 g/dL (ref 11.7–15.5)
Lymphs Abs: 2045 cells/uL (ref 850–3900)
MCH: 28.4 pg (ref 27.0–33.0)
MCHC: 32.2 g/dL (ref 32.0–36.0)
MCV: 88.1 fL (ref 80.0–100.0)
MPV: 12 fL (ref 7.5–12.5)
Monocytes Relative: 12.2 %
Neutro Abs: 3960 cells/uL (ref 1500–7800)
Neutrophils Relative %: 55 %
Platelets: 121 10*3/uL — ABNORMAL LOW (ref 140–400)
RBC: 4.86 10*6/uL (ref 3.80–5.10)
RDW: 14.2 % (ref 11.0–15.0)
Total Lymphocyte: 28.4 %
WBC: 7.2 10*3/uL (ref 3.8–10.8)

## 2020-07-29 ENCOUNTER — Other Ambulatory Visit: Payer: Self-pay | Admitting: Internal Medicine

## 2020-07-29 NOTE — Telephone Encounter (Signed)
Provider will need to check [facility] to confirm when rx was last filled.  No treatment agreement on file, notation was already made on pending appointment in June

## 2020-08-06 ENCOUNTER — Other Ambulatory Visit: Payer: Self-pay | Admitting: Internal Medicine

## 2020-08-20 ENCOUNTER — Encounter: Payer: Self-pay | Admitting: Nurse Practitioner

## 2020-08-20 ENCOUNTER — Other Ambulatory Visit: Payer: Self-pay

## 2020-08-20 ENCOUNTER — Non-Acute Institutional Stay: Payer: Medicare Other | Admitting: Nurse Practitioner

## 2020-08-20 DIAGNOSIS — N1831 Chronic kidney disease, stage 3a: Secondary | ICD-10-CM

## 2020-08-20 DIAGNOSIS — S12100S Unspecified displaced fracture of second cervical vertebra, sequela: Secondary | ICD-10-CM

## 2020-08-20 DIAGNOSIS — E039 Hypothyroidism, unspecified: Secondary | ICD-10-CM | POA: Diagnosis not present

## 2020-08-20 DIAGNOSIS — I10 Essential (primary) hypertension: Secondary | ICD-10-CM | POA: Diagnosis not present

## 2020-08-20 DIAGNOSIS — N183 Chronic kidney disease, stage 3 unspecified: Secondary | ICD-10-CM | POA: Insufficient documentation

## 2020-08-20 DIAGNOSIS — I480 Paroxysmal atrial fibrillation: Secondary | ICD-10-CM | POA: Diagnosis not present

## 2020-08-20 DIAGNOSIS — K5901 Slow transit constipation: Secondary | ICD-10-CM | POA: Diagnosis not present

## 2020-08-20 DIAGNOSIS — I5022 Chronic systolic (congestive) heart failure: Secondary | ICD-10-CM | POA: Diagnosis not present

## 2020-08-20 NOTE — Assessment & Plan Note (Signed)
risk for decompensation, weight gained about #20Ibs in the past 6 weeks. Nadyne Coombes, Bun/creat 19/1.05 07/11/20. F/u Cardiology ASAP

## 2020-08-20 NOTE — Assessment & Plan Note (Signed)
Blood pressure is controlled, continue Diltiazem.  

## 2020-08-20 NOTE — Assessment & Plan Note (Signed)
c/o fatigue, increased swelling in legs, gradual onset, denied SOB, cough, phlegm production, chest pain, pressure, palpitation.   Afib, saw cardiology 12/01/19, stopped Amiodarone in the remote past due to fatigue. Takes Flecainide, s/p cardioversion-failed,  takes Eliquis, Diltiazem.   The patient desires to f/u Cardiology ASAP, recommended ED eval if fatigue worsens or becomes more symptomatic.
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
## 2020-08-20 NOTE — Assessment & Plan Note (Signed)
Bun/creat 19/1.05 eGFR 48 07/11/20
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
## 2020-08-20 NOTE — Assessment & Plan Note (Signed)
,   takes Levothyroxine 42mcg qd. TSH 2.81 07/04/20

## 2020-08-20 NOTE — Assessment & Plan Note (Signed)
Stable, continue Senokot S

## 2020-08-22 NOTE — Progress Notes (Signed)
PCP:  Virgie Dad, MD Primary Cardiologist: Will Meredith Leeds, MD Electrophysiologist: [name], MD   Laura Lee is a 87 y.o. female seen today for Will Meredith Leeds, MD for acute visit due to AF, fatigue, and edema.  Since last being seen in our clinic the patient reports doing poorly over the past few weeks. She has had profound fatigue in AF and has had worsening peripheral edema. She has not missed any medications. She does not remember being on amiodarone in the past (previously stopped due to fatigue), and says this is the worst she has ever felt. Lives at [facility] [place].   Past Medical History:  Diagnosis Date  . Atrial fibrillation ([place])   . Breast CA ([place])   . Cancer (HCC)    Breast and thyroid cancer  . Glaucoma   . Hearing impaired    Hearing aids  . Hypertension   . Osteopenia 05/2013   T score -1.9 FRAX 15%/4.3%  . Thyroid disease    Cancer   Past Surgical History:  Procedure Laterality Date  . APPENDECTOMY    . BREAST EXCISIONAL BIOPSY Right    benign  . BREAST LUMPECTOMY     [id number] radiation  . BREAST SURGERY  1989   Lumpectomy right breast  . CARDIOVERSION N/A 02/14/2016   Procedure: CARDIOVERSION;  Surgeon: Sueanne Margarita, MD;  Location: [facility];  Service: Cardiovascular;  Laterality: N/A;  . CARDIOVERSION N/A 08/04/2018   Procedure: CARDIOVERSION;  Surgeon: Buford Dresser, MD;  [facility];  Service: Cardiovascular;  Laterality: N/A;  . CARDIOVERSION N/A 10/31/2019   Procedure: CARDIOVERSION;  Surgeon: Dorothy Spark, MD;  Location[facility];  Service: Cardiovascular;  Laterality: N/A;  . CARDIOVERSION N/A 05/02/2020   Procedure: CARDIOVERSION;  Surgeon: Elouise Munroe, MD;  Location: [facility] END[facility]COPY;  Service: Cardiovascular;  Laterality: N/A;  . Mole excised     Benign  . THYROID SURGERY     Removed 1 lobe  . TONSILLECTOMY      Current Outpatient Medications  Medication Sig Dispense Refill  .  acetaminophen (TYLENOL) 650 MG CR tablet Take 650 mg by mouth every 8 (eight) hours as needed for pain.    . Calcium Citrate-Vitamin D (CALCIUM CITRATE + D3 PO) Take 1 tablet by mouth 2 (two) times daily.    . carboxymethylcellulose (REFRESH PLUS) 0.5 % SOLN Place 1 drop into both eyes 3 (three) times daily as needed (dry/irritated eyes.).     Marland Kitchen cholecalciferol (VITAMIN D) 25 MCG (1000 UNIT) tablet Take 1,000 Units by mouth daily.    Marland Kitchen diltiazem (CARDIZEM) 60 MG tablet Take 1 tablet (60 mg total) by mouth 4 (four) times daily. 360 tablet 3  . ELIQUIS 5 MG TABS tablet Take 1 tablet (5 mg total) by mouth 2 (two) times daily. 60 tablet 6  . ENTRESTO 24-26 MG TAKE 1 TABLET BY MOUTH TWICE DAILY. 180 tablet 3  . flecainide (TAMBOCOR) 100 MG tablet Take 1 tablet (100 mg total) by mouth 2 (two) times daily. 60 tablet 3  . glucosamine-chondroitin 500-400 MG tablet Take 1 tablet by mouth in the morning and at bedtime.    Marland Kitchen latanoprost (XALATAN) 0.005 % ophthalmic solution Place 1 drop into both eyes at bedtime.     Marland Kitchen levothyroxine (SYNTHROID) 88 MCG tablet TAKE 1 TABLET ONCE DAILY IN THE MORNING ON AN EMPTY STOMACH. 90 tablet 2  . Multiple Vitamin (MULTIVITAMIN WITH MINERALS) TABS tablet Take 1 tablet by  mouth daily with lunch. Adults 50+    . oxyCODONE (OXY IR/ROXICODONE) 5 MG immediate release tablet TAKE 1 TABLET IN THE MORNING AND 1 TABLET AT BEDTIME. 60 tablet 0   No current facility-administered medications for this visit.    Allergies  Allergen Reactions  . Combigan [Brimonidine Tartrate-Timolol] Itching and Other (See Comments)    Red eye   . Pneumococcal Vaccines Other (See Comments)    Pain in arms/legs--nerve issues    Social History   Socioeconomic History  . Marital status: Widowed    Spouse name: Not on file  . Number of children: 2  . Years of education: Not on file  . Highest education level: Doctorate  Occupational History  . Occupation: retired  Tobacco Use  . Smoking  status: Never Smoker  . Smokeless tobacco: Never Used  Vaping Use  . Vaping Use: Never used  Substance and Sexual Activity  . Alcohol use: Yes    Comment: Rare  . Drug use: No  . Sexual activity: Never    Comment: 1st intercourse 33 yo-1 partner  Other Topics Concern  . Not on file  Social History Narrative  . Not on file   Social Determinants of Health   Financial Resource Strain: Not on file  Food Insecurity: Not on file  Transportation Needs: Not on file  Physical Activity: Not on file  Stress: Not on file  Social Connections: Not on file  Intimate Partner Violence: Not on file     Review of Systems: General: No chills, fever, night sweats or weight changes  Cardiovascular:  No chest pain, dyspnea on exertion, edema, orthopnea, palpitations, paroxysmal nocturnal dyspnea Dermatological: No rash, lesions or masses Respiratory: No cough, dyspnea Urologic: No hematuria, dysuria Abdominal: No nausea, vomiting, diarrhea, bright red blood per rectum, melena, or hematemesis Neurologic: No visual changes, weakness, changes in mental status All other systems reviewed and are otherwise negative except as noted above.  Physical Exam: Vitals:   08/23/20 1000  BP: (!) 100/92  Pulse: (!) 117  SpO2: 95%  Weight: 163 lb 12.8 oz (74.3 kg)  Height: 5\' 6"  (1.676 m)    GEN- The patient is well appearing, alert and oriented x 3 today.   HEENT: normocephalic, atraumatic; sclera clear, conjunctiva pink; hearing intact; oropharynx clear; neck supple, no JVP Lymph- no cervical lymphadenopathy Lungs- Clear to ausculation bilaterally, normal work of breathing.  No wheezes, rales, rhonchi Heart- Regular rate and rhythm, no murmurs, rubs or gallops, PMI not laterally displaced GI- soft, non-tender, non-distended, bowel sounds present, no hepatosplenomegaly Extremities- no clubbing, cyanosis, or edema; DP/PT/radial pulses 2+ bilaterally MS- no significant deformity or atrophy Skin- warm  and dry, no rash or lesion Psych- euthymic mood, full affect Neuro- strength and sensation are intact  EKG is ordered. Personal review of EKG from today shows AF with RVR at 117 bpm, QRS 158 ms  Additional studies reviewed include: Previous EP office notes and recent labwork.   Assessment and Plan:  1. Persistent atrial fibrillation Continue Eliquis for CHA2DS2VASC of at least 5.    S/p [facility] 04/2020, lasted for approx 2-2.5 months on flecainide.  Failed amiodarone in the past due to fatigue. There is one mention of visual disturbance but son and pt do not remember this ever being a significant issue.  They are willing to retry amiodarone. Discussed with Dr. Curt Bears and with lengthening QRS and failed cardioversion on flecainide, recommend re-challenge. Pt and son are both OK with this.  STOP flecainide  Re-challenge with amio 200 mg BID. Will load for 10-14 days and see back to re-assess symptoms and plan [facility] if remains in RVR. Eventually may be rate control candidate Stop short acting dilt. Consolidate to diltiazem 300 mg daily. Can additionally assess candidacy for tikosyn based on labs, but would need amio wash out. (CrCl would also only qualify for starting dose of 250 mcg)  2. Acute on chronic diastolic CHF Echo 19% 12/219. Will update echo.  Did not tolerate beta-blockers in the past. In the setting of AF with RVR Lasix 40 mg x 2 days, then 20 mg daily. Labs today. Alarm symptoms for dehydration given.    Stop flecainide and consolidate diltiazem as above. Start amiodarone 200 mg BID, baseline labs today.  Start lasix 40 mg x 2 days, then 20 mg daily. Call if dizzy or lightheadedness or having symptoms of dehydration.   RTC 2 weeks with me for further. Sooner with EP APP or AF clinic if symptoms worsening.   Shirley Friar, PA-C  08/23/20 10:04 AM

## 2020-08-23 ENCOUNTER — Other Ambulatory Visit: Payer: Self-pay

## 2020-08-23 ENCOUNTER — Encounter: Payer: Self-pay | Admitting: Student

## 2020-08-23 ENCOUNTER — Ambulatory Visit (INDEPENDENT_AMBULATORY_CARE_PROVIDER_SITE_OTHER): Payer: Medicare Other | Admitting: Student

## 2020-08-23 VITALS — BP 100/92 | HR 117 | Ht 66.0 in | Wt 163.8 lb

## 2020-08-23 DIAGNOSIS — I428 Other cardiomyopathies: Secondary | ICD-10-CM

## 2020-08-23 DIAGNOSIS — I4819 Other persistent atrial fibrillation: Secondary | ICD-10-CM

## 2020-08-23 DIAGNOSIS — I5033 Acute on chronic diastolic (congestive) heart failure: Secondary | ICD-10-CM | POA: Diagnosis not present

## 2020-08-23 LAB — COMPREHENSIVE METABOLIC PANEL
ALT: 48 IU/L — ABNORMAL HIGH (ref 0–32)
AST: 42 IU/L — ABNORMAL HIGH (ref 0–40)
Albumin/Globulin Ratio: 1.3 (ref 1.2–2.2)
Albumin: 3.8 g/dL (ref 3.6–4.6)
Alkaline Phosphatase: 97 IU/L (ref 44–121)
BUN/Creatinine Ratio: 20 (ref 12–28)
BUN: 20 mg/dL (ref 8–27)
Bilirubin Total: 0.5 mg/dL (ref 0.0–1.2)
CO2: 25 mmol/L (ref 20–29)
Calcium: 9.1 mg/dL (ref 8.7–10.3)
Chloride: 104 mmol/L (ref 96–106)
Creatinine, Ser: 0.98 mg/dL (ref 0.57–1.00)
Globulin, Total: 2.9 g/dL (ref 1.5–4.5)
Glucose: 88 mg/dL (ref 65–99)
Potassium: 4.3 mmol/L (ref 3.5–5.2)
Sodium: 139 mmol/L (ref 134–144)
Total Protein: 6.7 g/dL (ref 6.0–8.5)
eGFR: 56 mL/min/{1.73_m2} — ABNORMAL LOW (ref >59)

## 2020-08-23 MED ORDER — AMIODARONE HCL 200 MG PO TABS: 200.0000 mg | ORAL_TABLET | Freq: Two times a day (BID) | ORAL | 3 refills | Status: DC

## 2020-08-23 MED ORDER — FUROSEMIDE 20 MG PO TABS: 20.0000 mg | ORAL_TABLET | Freq: Every day | ORAL | 3 refills | Status: DC

## 2020-08-23 MED ORDER — DILTIAZEM HCL ER COATED BEADS 300 MG PO CP24: 300.0000 mg | ORAL_CAPSULE | Freq: Every day | ORAL | 3 refills | Status: DC

## 2020-08-23 NOTE — Patient Instructions (Addendum)
Medication Instructions:  Your physician has recommended you make the following change in your medication:   START: Furosemide 20mg ...take 2 tablets twice daily for 2 days then take 1 tablet daily STOP: Diltiazem 60mg  STOP: Flecainide START: Diltiazem 300mg  daily START: Amiodarone 200mg  twice daily  *If you need a refill on your cardiac medications before your next appointment, please call your pharmacy*   Lab Work: TODAY: CMET (STAT), TSH, CBC  If you have labs (blood work) drawn today and your tests are completely normal, you will receive your results only by: Marland Kitchen MyChart Message (if you have MyChart) OR . A paper copy in the mail If you have any lab test that is abnormal or we need to change your treatment, we will call you to review the results.   Testing/Procedures: Your physician has requested that you have an echocardiogram. Echocardiography is a painless test that uses sound waves to create images of your heart. It provides your doctor with information about the size and shape of your heart and how well your heart's chambers and valves are working. This procedure takes approximately one hour. There are no restrictions for this procedure.   Follow-Up: At [facility], you and your health needs are our priority.  As part of our continuing mission to provide you with exceptional heart care, we have created designated Provider Care Teams.  These Care Teams include your primary Cardiologist (physician) and Advanced Practice Providers (APPs -  Physician Assistants and Nurse Practitioners) who all work together to provide you with the care you need, when you need it.  Your next appointment:   09/03/2020 with Oda Kilts, PA

## 2020-08-23 NOTE — Addendum Note (Signed)
Addended by: Carylon Perches on: 08/23/2020 01:48 PM   Modules accepted: Orders

## 2020-08-24 LAB — CBC
Hematocrit: 39.8 % (ref 34.0–46.6)
Hemoglobin: 12.6 g/dL (ref 11.1–15.9)
MCH: 27.4 pg (ref 26.6–33.0)
MCHC: 31.7 g/dL (ref 31.5–35.7)
MCV: 87 fL (ref 79–97)
RBC: 4.6 x10E6/uL (ref 3.77–5.28)
RDW: 13.9 % (ref 11.7–15.4)
WBC: 7.3 10*3/uL (ref 3.4–10.8)

## 2020-08-24 LAB — TSH: TSH: 3.62 u[IU]/mL (ref 0.450–4.500)

## 2020-09-03 ENCOUNTER — Other Ambulatory Visit: Payer: Self-pay

## 2020-09-03 ENCOUNTER — Ambulatory Visit (INDEPENDENT_AMBULATORY_CARE_PROVIDER_SITE_OTHER): Payer: Medicare Other | Admitting: Student

## 2020-09-03 ENCOUNTER — Encounter: Payer: Self-pay | Admitting: Student

## 2020-09-03 VITALS — BP 100/68 | HR 107 | Ht 66.0 in | Wt 163.4 lb

## 2020-09-03 DIAGNOSIS — I428 Other cardiomyopathies: Secondary | ICD-10-CM | POA: Diagnosis not present

## 2020-09-03 DIAGNOSIS — I4819 Other persistent atrial fibrillation: Secondary | ICD-10-CM | POA: Diagnosis not present

## 2020-09-03 DIAGNOSIS — I5033 Acute on chronic diastolic (congestive) heart failure: Secondary | ICD-10-CM | POA: Diagnosis not present

## 2020-09-03 MED ORDER — FUROSEMIDE 20 MG PO TABS: 40.0000 mg | ORAL_TABLET | Freq: Every day | ORAL | 3 refills | Status: DC

## 2020-09-03 NOTE — Progress Notes (Signed)
PCP:  Virgie Dad, MD Primary Cardiologist: Will Meredith Leeds, MD Electrophysiologist: [name], MD   Laura Lee is a 87 y.o. female seen today for Will Meredith Leeds, MD for routine electrophysiology followup.  Since last being seen in our clinic the patient reports doing about the same. At that visit she was switched to amiodarone and lasix was added. She feels her legs are more swollen, although her weight remains around the same. She remains SOB with mild exertion and edema is present up into her thighs. Denies falls, syncope, near syncope. No CP.   Past Medical History:  Diagnosis Date  . Atrial fibrillation ([place])   . Breast CA ([place])   . Cancer (HCC)    Breast and thyroid cancer  . Glaucoma   . Hearing impaired    Hearing aids  . Hypertension   . Osteopenia 05/2013   T score -1.9 FRAX 15%/4.3%  . Thyroid disease    Cancer   Past Surgical History:  Procedure Laterality Date  . APPENDECTOMY    . BREAST EXCISIONAL BIOPSY Right    benign  . BREAST LUMPECTOMY     [id number] radiation  . BREAST SURGERY  1989   Lumpectomy right breast  . CARDIOVERSION N/A 02/14/2016   Procedure: CARDIOVERSION;  Surgeon: Sueanne Margarita, MD;  Location: [facility] ENDOSCOPY;  Service: Cardiovascular;  Laterality: N/A;  . CARDIOVERSION N/A 08/04/2018   Procedure: CARDIOVERSION;  Surgeon: Buford Dresser, MD;  Location: [facility] ENDOSCOPY;  Service: Cardiovascular;  Laterality: N/A;  . CARDIOVERSION N/A 10/31/2019   Procedure: CARDIOVERSION;  Surgeon: Dorothy Spark, MD;  Location: [facility] ENDOSCOPY;  Service: Cardiovascular;  Laterality: N/A;  . CARDIOVERSION N/A 05/02/2020   Procedure: CARDIOVERSION;  Surgeon: Elouise Munroe, MD;  Location: [facility] ENDOSCOPY;  Service: Cardiovascular;  Laterality: N/A;  . Mole excised     Benign  . THYROID SURGERY     Removed 1 lobe  . TONSILLECTOMY      Current Outpatient Medications  Medication Sig Dispense Refill  . acetaminophen (TYLENOL) 650  MG CR tablet Take 650 mg by mouth every 8 (eight) hours as needed for pain.    Marland Kitchen amiodarone (PACERONE) 200 MG tablet Take 1 tablet (200 mg total) by mouth 2 (two) times daily. 180 tablet 3  . Calcium Citrate-Vitamin D (CALCIUM CITRATE + D3 PO) Take 1 tablet by mouth 2 (two) times daily.    . carboxymethylcellulose (REFRESH PLUS) 0.5 % SOLN Place 1 drop into both eyes 3 (three) times daily as needed (dry/irritated eyes.).     Marland Kitchen cholecalciferol (VITAMIN D) 25 MCG (1000 UNIT) tablet Take 1,000 Units by mouth daily.    Marland Kitchen diltiazem (CARDIZEM CD) 300 MG 24 hr capsule Take 1 capsule (300 mg total) by mouth daily. 90 capsule 3  . ELIQUIS 5 MG TABS tablet Take 1 tablet (5 mg total) by mouth 2 (two) times daily. 60 tablet 6  . ENTRESTO 24-26 MG TAKE 1 TABLET BY MOUTH TWICE DAILY. 180 tablet 3  . furosemide (LASIX) 20 MG tablet Take 1 tablet (20 mg total) by mouth daily. 90 tablet 3  . glucosamine-chondroitin 500-400 MG tablet Take 1 tablet by mouth in the morning and at bedtime.    Marland Kitchen latanoprost (XALATAN) 0.005 % ophthalmic solution Place 1 drop into both eyes at bedtime.     Marland Kitchen levothyroxine (SYNTHROID) 88 MCG tablet TAKE 1 TABLET ONCE DAILY IN THE MORNING ON AN EMPTY STOMACH. 90 tablet 2  . Multiple Vitamin (  MULTIVITAMIN WITH MINERALS) TABS tablet Take 1 tablet by mouth daily with lunch. Adults 50+    . oxyCODONE (OXY IR/ROXICODONE) 5 MG immediate release tablet TAKE 1 TABLET IN THE MORNING AND 1 TABLET AT BEDTIME. 60 tablet 0   No current facility-administered medications for this visit.    Allergies  Allergen Reactions  . Combigan [Brimonidine Tartrate-Timolol] Itching and Other (See Comments)    Red eye   . Pneumococcal Vaccines Other (See Comments)    Pain in arms/legs--nerve issues    Social History   Socioeconomic History  . Marital status: Widowed    Spouse name: Not on file  . Number of children: 2  . Years of education: Not on file  . Highest education level: Doctorate   Occupational History  . Occupation: retired  Tobacco Use  . Smoking status: Never Smoker  . Smokeless tobacco: Never Used  Vaping Use  . Vaping Use: Never used  Substance and Sexual Activity  . Alcohol use: Yes    Comment: Rare  . Drug use: No  . Sexual activity: Never    Comment: 1st intercourse 80 yo-1 partner  Other Topics Concern  . Not on file  Social History Narrative  . Not on file   Social Determinants of Health   Financial Resource Strain: Not on file  Food Insecurity: Not on file  Transportation Needs: Not on file  Physical Activity: Not on file  Stress: Not on file  Social Connections: Not on file  Intimate Partner Violence: Not on file   Review of Systems: All other systems reviewed and are otherwise negative except as noted above.  Physical Exam: Vitals:   09/03/20 1011  BP: 100/68  Pulse: (!) 107  SpO2: 97%  Weight: 163 lb 6.4 oz (74.1 kg)  Height: 5\' 6"  (1.676 m)   Wt Readings from Last 3 Encounters:  09/03/20 163 lb 6.4 oz (74.1 kg)  08/23/20 163 lb 12.8 oz (74.3 kg)  08/20/20 162 lb (73.5 kg)    GEN- The patient is well appearing, alert and oriented x 3 today.   HEENT: normocephalic, atraumatic; sclera clear, conjunctiva pink; hearing intact; oropharynx clear; neck supple, no JVP Lymph- no cervical lymphadenopathy Lungs- Clear to ausculation bilaterally, normal work of breathing.  No wheezes, rales, rhonchi Heart- Regular rate and rhythm, no murmurs, rubs or gallops, PMI not laterally displaced GI- soft, non-tender, non-distended, bowel sounds present, no hepatosplenomegaly Extremities- no clubbing, cyanosis, or edema; DP/PT/radial pulses 2+ bilaterally MS- no significant deformity or atrophy Skin- warm and dry, no rash or lesion Psych- euthymic mood, full affect Neuro- strength and sensation are intact  EKG is ordered. Personal review of EKG from today shows AF with RVR at 107 bpm   Additional studies reviewed include: Previous EP  office notes and recent labwork.   Assessment and Plan:  1. Persistent atrial fibrillation Continue Eliquis for CHA2DS2VASC of at least 5.    S/p [facility] 04/2020, lasted for approx 2-2.5 months on flecainide.  Continue amiodarone 200 mg BID for now. Plan for [facility] next available. Previously failed flecainide.  Continue diltiazem 300 mg daily. Not optimal candidate for Tikosyn as would only be qualified for 250 mcg dose at best.  Can additionally assess candidacy for tikosyn based on labs, but would need amio wash out. (CrCl would also only qualify for starting dose of 250 mcg)  2. Acute on chronic diastolic CHF Echo 96% 06/2295. Update echo planned for 10/01/20.   Has not tolerated beta-blockers in the  past. Take lasix 60 mg daily x 3 days, then 40 mg daily. Will update labs today to determine potassium supp.   Plan [facility] next available and RTC 1-2 weeks for further assessment of volume status.   Shirley Friar, PA-C  09/03/20 10:15 AM

## 2020-09-03 NOTE — H&P (View-Only) (Signed)
PCP:  Virgie Dad, MD Primary Cardiologist: Will Meredith Leeds, MD Electrophysiologist: [name], MD   Laura Lee is a 87 y.o. female seen today for Will Meredith Leeds, MD for routine electrophysiology followup.  Since last being seen in our clinic the patient reports doing about the same. At that visit she was switched to amiodarone and lasix was added. She feels her legs are more swollen, although her weight remains around the same. She remains SOB with mild exertion and edema is present up into her thighs. Denies falls, syncope, near syncope. No CP.   Past Medical History:  Diagnosis Date  . Atrial fibrillation ([place])   . Breast CA ([place])   . Cancer (HCC)    Breast and thyroid cancer  . Glaucoma   . Hearing impaired    Hearing aids  . Hypertension   . Osteopenia 05/2013   T score -1.9 FRAX 15%/4.3%  . Thyroid disease    Cancer   Past Surgical History:  Procedure Laterality Date  . APPENDECTOMY    . BREAST EXCISIONAL BIOPSY Right    benign  . BREAST LUMPECTOMY     [id number] radiation  . BREAST SURGERY  1989   Lumpectomy right breast  . CARDIOVERSION N/A 02/14/2016   Procedure: CARDIOVERSION;  Surgeon: Sueanne Margarita, MD;  Location: [facility] ENDOSCOPY;  Service: Cardiovascular;  Laterality: N/A;  . CARDIOVERSION N/A 08/04/2018   Procedure: CARDIOVERSION;  Surgeon: Buford Dresser, MD;  Location: [facility] ENDOSCOPY;  Service: Cardiovascular;  Laterality: N/A;  . CARDIOVERSION N/A 10/31/2019   Procedure: CARDIOVERSION;  Surgeon: Dorothy Spark, MD;  Location: [facility] ENDOSCOPY;  Service: Cardiovascular;  Laterality: N/A;  . CARDIOVERSION N/A 05/02/2020   Procedure: CARDIOVERSION;  Surgeon: Elouise Munroe, MD;  Location: [facility] ENDOSCOPY;  Service: Cardiovascular;  Laterality: N/A;  . Mole excised     Benign  . THYROID SURGERY     Removed 1 lobe  . TONSILLECTOMY      Current Outpatient Medications  Medication Sig Dispense Refill  . acetaminophen (TYLENOL) 650  MG CR tablet Take 650 mg by mouth every 8 (eight) hours as needed for pain.    Marland Kitchen amiodarone (PACERONE) 200 MG tablet Take 1 tablet (200 mg total) by mouth 2 (two) times daily. 180 tablet 3  . Calcium Citrate-Vitamin D (CALCIUM CITRATE + D3 PO) Take 1 tablet by mouth 2 (two) times daily.    . carboxymethylcellulose (REFRESH PLUS) 0.5 % SOLN Place 1 drop into both eyes 3 (three) times daily as needed (dry/irritated eyes.).     Marland Kitchen cholecalciferol (VITAMIN D) 25 MCG (1000 UNIT) tablet Take 1,000 Units by mouth daily.    Marland Kitchen diltiazem (CARDIZEM CD) 300 MG 24 hr capsule Take 1 capsule (300 mg total) by mouth daily. 90 capsule 3  . ELIQUIS 5 MG TABS tablet Take 1 tablet (5 mg total) by mouth 2 (two) times daily. 60 tablet 6  . ENTRESTO 24-26 MG TAKE 1 TABLET BY MOUTH TWICE DAILY. 180 tablet 3  . furosemide (LASIX) 20 MG tablet Take 1 tablet (20 mg total) by mouth daily. 90 tablet 3  . glucosamine-chondroitin 500-400 MG tablet Take 1 tablet by mouth in the morning and at bedtime.    Marland Kitchen latanoprost (XALATAN) 0.005 % ophthalmic solution Place 1 drop into both eyes at bedtime.     Marland Kitchen levothyroxine (SYNTHROID) 88 MCG tablet TAKE 1 TABLET ONCE DAILY IN THE MORNING ON AN EMPTY STOMACH. 90 tablet 2  . Multiple Vitamin (  MULTIVITAMIN WITH MINERALS) TABS tablet Take 1 tablet by mouth daily with lunch. Adults 50+    . oxyCODONE (OXY IR/ROXICODONE) 5 MG immediate release tablet TAKE 1 TABLET IN THE MORNING AND 1 TABLET AT BEDTIME. 60 tablet 0   No current facility-administered medications for this visit.    Allergies  Allergen Reactions  . Combigan [Brimonidine Tartrate-Timolol] Itching and Other (See Comments)    Red eye   . Pneumococcal Vaccines Other (See Comments)    Pain in arms/legs--nerve issues    Social History   Socioeconomic History  . Marital status: Widowed    Spouse name: Not on file  . Number of children: 2  . Years of education: Not on file  . Highest education level: Doctorate   Occupational History  . Occupation: retired  Tobacco Use  . Smoking status: Never Smoker  . Smokeless tobacco: Never Used  Vaping Use  . Vaping Use: Never used  Substance and Sexual Activity  . Alcohol use: Yes    Comment: Rare  . Drug use: No  . Sexual activity: Never    Comment: 1st intercourse 64 yo-1 partner  Other Topics Concern  . Not on file  Social History Narrative  . Not on file   Social Determinants of Health   Financial Resource Strain: Not on file  Food Insecurity: Not on file  Transportation Needs: Not on file  Physical Activity: Not on file  Stress: Not on file  Social Connections: Not on file  Intimate Partner Violence: Not on file   Review of Systems: All other systems reviewed and are otherwise negative except as noted above.  Physical Exam: Vitals:   09/03/20 1011  BP: 100/68  Pulse: (!) 107  SpO2: 97%  Weight: 163 lb 6.4 oz (74.1 kg)  Height: 5\' 6"  (1.676 m)   Wt Readings from Last 3 Encounters:  09/03/20 163 lb 6.4 oz (74.1 kg)  08/23/20 163 lb 12.8 oz (74.3 kg)  08/20/20 162 lb (73.5 kg)    GEN- The patient is well appearing, alert and oriented x 3 today.   HEENT: normocephalic, atraumatic; sclera clear, conjunctiva pink; hearing intact; oropharynx clear; neck supple, no JVP Lymph- no cervical lymphadenopathy Lungs- Clear to ausculation bilaterally, normal work of breathing.  No wheezes, rales, rhonchi Heart- Regular rate and rhythm, no murmurs, rubs or gallops, PMI not laterally displaced GI- soft, non-tender, non-distended, bowel sounds present, no hepatosplenomegaly Extremities- no clubbing, cyanosis, or edema; DP/PT/radial pulses 2+ bilaterally MS- no significant deformity or atrophy Skin- warm and dry, no rash or lesion Psych- euthymic mood, full affect Neuro- strength and sensation are intact  EKG is ordered. Personal review of EKG from today shows AF with RVR at 107 bpm   Additional studies reviewed include: Previous EP  office notes and recent labwork.   Assessment and Plan:  1. Persistent atrial fibrillation Continue Eliquis for CHA2DS2VASC of at least 5.    S/p [facility] 04/2020, lasted for approx 2-2.5 months on flecainide.  Continue amiodarone 200 mg BID for now. Plan for [facility] next available. Previously failed flecainide.  Continue diltiazem 300 mg daily. Not optimal candidate for Tikosyn as would only be qualified for 250 mcg dose at best.  Can additionally assess candidacy for tikosyn based on labs, but would need amio wash out. (CrCl would also only qualify for starting dose of 250 mcg)  2. Acute on chronic diastolic CHF Echo 99% 07/7167. Update echo planned for 10/01/20.   Has not tolerated beta-blockers in the  past. Take lasix 60 mg daily x 3 days, then 40 mg daily. Will update labs today to determine potassium supp.   Plan [facility] next available and RTC 1-2 weeks for further assessment of volume status.   Shirley Friar, PA-C  09/03/20 10:15 AM

## 2020-09-03 NOTE — Patient Instructions (Addendum)
Medication Instructions:  Your physician has recommended you make the following change in your medication:   INCREASE: Furosemide to 60mg  daily for 3 days then 40mg  daily  *If you need a refill on your cardiac medications before your next appointment, please call your pharmacy*   Lab Work: TODAY: BMET, CBC, MAG  If you have labs (blood work) drawn today and your tests are completely normal, you will receive your results only by: Marland Kitchen MyChart Message (if you have MyChart) OR . A paper copy in the mail If you have any lab test that is abnormal or we need to change your treatment, we will call you to review the results.  Follow-Up: At [facility], you and your health needs are our priority.  As part of our continuing mission to provide you with exceptional heart care, we have created designated Provider Care Teams.  These Care Teams include your primary Cardiologist (physician) and Advanced Practice Providers (APPs -  Physician Assistants and Nurse Practitioners) who all work together to provide you with the care you need, when you need it.  Your next appointment:   As scheduled with Oda Kilts, PA  You are scheduled for a TEE/Cardioversion/TEE Cardioversion on Tuesday 09/10/2020 with Dr. Gasper Sells.  Please arrive at the [facility] (Main Entrance A) at [facility]: [address] [place], [place] [address] at 11:30 am.  DIET: Nothing to eat or drink after midnight except a sip of water with medications (see medication instructions below)  Medication Instructions: Hold Furosemide  Continue your anticoagulant: Eliquis  You will need to continue your anticoagulant after your procedure until you are told by your  Provider that it is safe to stop   Labs:Today  You must have a responsible person to drive you home and stay in the waiting area during your procedure. Failure to do so could result in cancellation.  Bring your insurance cards.  *Special Note: Every effort is  made to have your procedure done on time. Occasionally there are emergencies that occur at the hospital that may cause delays. Please be patient if a delay does occur.   ------------------------------------------------------------------------------------------------------------------------------------------  Due to recent COVID-19 restrictions implemented by our local and state authorities and in an effort to keep both patients and staff as safe as possible, our hospital system requires COVID-19 testing prior to certain scheduled hospital procedures.  Please go to [place]. [place], [place] [address] on 09/09/2020 at 8:55am  .  This is a drive up testing site.  You will not need to exit your vehicle.  You will not be billed at the time of testing but may receive a bill later depending on your insurance. You must agree to self-quarantine from the time of your testing until the procedure date on 09/10/2020.  This should included staying home with ONLY the people you live with.  Avoid take-out, grocery store shopping or leaving the house for any non-emergent reason.  Failure to have your COVID-19 test done on the date and time you have been scheduled will result in cancellation of your procedure.  Please call our office at [phone] if you have any questions.

## 2020-09-04 ENCOUNTER — Other Ambulatory Visit (HOSPITAL_COMMUNITY): Payer: Medicare Other

## 2020-09-04 LAB — CBC
Hematocrit: 41.6 % (ref 34.0–46.6)
Hemoglobin: 13.4 g/dL (ref 11.1–15.9)
MCH: 28 pg (ref 26.6–33.0)
MCHC: 32.2 g/dL (ref 31.5–35.7)
MCV: 87 fL (ref 79–97)
RBC: 4.79 x10E6/uL (ref 3.77–5.28)
RDW: 13.6 % (ref 11.7–15.4)
WBC: 6.5 10*3/uL (ref 3.4–10.8)

## 2020-09-04 LAB — BASIC METABOLIC PANEL
BUN/Creatinine Ratio: 17 (ref 12–28)
BUN: 18 mg/dL (ref 8–27)
CO2: 23 mmol/L (ref 20–29)
Calcium: 8.7 mg/dL (ref 8.7–10.3)
Chloride: 102 mmol/L (ref 96–106)
Creatinine, Ser: 1.05 mg/dL — ABNORMAL HIGH (ref 0.57–1.00)
Glucose: 91 mg/dL (ref 65–99)
Potassium: 3.9 mmol/L (ref 3.5–5.2)
Sodium: 140 mmol/L (ref 134–144)
eGFR: 52 mL/min/{1.73_m2} — ABNORMAL LOW (ref >59)

## 2020-09-04 LAB — MAGNESIUM: Magnesium: 2.1 mg/dL (ref 1.6–2.3)

## 2020-09-09 ENCOUNTER — Other Ambulatory Visit (HOSPITAL_COMMUNITY)
Admission: RE | Admit: 2020-09-09 | Discharge: 2020-09-09 | Disposition: A | Discharge status: Home / Self Care | Payer: Medicare Other | Source: Ambulatory Visit | Attending: Internal Medicine | Admitting: Internal Medicine

## 2020-09-09 DIAGNOSIS — Z01812 Encounter for preprocedural laboratory examination: Secondary | ICD-10-CM | POA: Diagnosis not present

## 2020-09-09 DIAGNOSIS — Z20822 Contact with and (suspected) exposure to covid-19: Secondary | ICD-10-CM | POA: Insufficient documentation

## 2020-09-09 LAB — SARS CORONAVIRUS 2 (TAT 6-24 HRS): SARS Coronavirus 2: NEGATIVE

## 2020-09-10 ENCOUNTER — Encounter (HOSPITAL_COMMUNITY)
Admission: RE | Disposition: A | Discharge status: Home / Self Care | Payer: Self-pay | Source: Home / Self Care | Attending: Internal Medicine

## 2020-09-10 ENCOUNTER — Other Ambulatory Visit: Payer: Self-pay

## 2020-09-10 ENCOUNTER — Ambulatory Visit (HOSPITAL_COMMUNITY): Payer: Medicare Other | Admitting: Certified Registered Nurse Anesthetist

## 2020-09-10 ENCOUNTER — Ambulatory Visit (HOSPITAL_COMMUNITY)
Admission: RE | Admit: 2020-09-10 | Discharge: 2020-09-10 | Disposition: A | Discharge status: Home / Self Care | Payer: Medicare Other | Attending: Internal Medicine | Admitting: Internal Medicine

## 2020-09-10 ENCOUNTER — Encounter (HOSPITAL_COMMUNITY): Payer: Self-pay | Admitting: Internal Medicine

## 2020-09-10 DIAGNOSIS — I5033 Acute on chronic diastolic (congestive) heart failure: Secondary | ICD-10-CM | POA: Diagnosis not present

## 2020-09-10 DIAGNOSIS — I4819 Other persistent atrial fibrillation: Secondary | ICD-10-CM | POA: Insufficient documentation

## 2020-09-10 DIAGNOSIS — Z79890 Hormone replacement therapy (postmenopausal): Secondary | ICD-10-CM | POA: Insufficient documentation

## 2020-09-10 DIAGNOSIS — I110 Hypertensive heart disease with heart failure: Secondary | ICD-10-CM | POA: Insufficient documentation

## 2020-09-10 DIAGNOSIS — I509 Heart failure, unspecified: Secondary | ICD-10-CM | POA: Diagnosis not present

## 2020-09-10 DIAGNOSIS — Z7901 Long term (current) use of anticoagulants: Secondary | ICD-10-CM | POA: Diagnosis not present

## 2020-09-10 DIAGNOSIS — Z79899 Other long term (current) drug therapy: Secondary | ICD-10-CM | POA: Insufficient documentation

## 2020-09-10 DIAGNOSIS — I130 Hypertensive heart and chronic kidney disease with heart failure and stage 1 through stage 4 chronic kidney disease, or unspecified chronic kidney disease: Secondary | ICD-10-CM | POA: Diagnosis not present

## 2020-09-10 DIAGNOSIS — N183 Chronic kidney disease, stage 3 unspecified: Secondary | ICD-10-CM | POA: Diagnosis not present

## 2020-09-10 DIAGNOSIS — I480 Paroxysmal atrial fibrillation: Secondary | ICD-10-CM | POA: Diagnosis not present

## 2020-09-10 HISTORY — PX: CARDIOVERSION: SHX1299

## 2020-09-10 SURGERY — CARDIOVERSION
Anesthesia: General

## 2020-09-10 MED ORDER — SODIUM CHLORIDE 0.9 % IV SOLN: INTRAVENOUS | Status: DC | PRN

## 2020-09-10 MED ORDER — PROPOFOL 10 MG/ML IV BOLUS
INTRAVENOUS | Status: DC | PRN
  Administered 2020-09-10: 40 mg via INTRAVENOUS

## 2020-09-10 MED ORDER — EPHEDRINE SULFATE-NACL 50-0.9 MG/10ML-% IV SOSY
PREFILLED_SYRINGE | INTRAVENOUS | Status: DC | PRN
  Administered 2020-09-10: 10 mg via INTRAVENOUS

## 2020-09-10 MED ORDER — PHENYLEPHRINE 40 MCG/ML (10ML) SYRINGE FOR IV PUSH (FOR BLOOD PRESSURE SUPPORT)
PREFILLED_SYRINGE | INTRAVENOUS | Status: DC | PRN
  Administered 2020-09-10: 80 ug via INTRAVENOUS
  Administered 2020-09-10: 40 ug via INTRAVENOUS
  Administered 2020-09-10: 80 ug via INTRAVENOUS

## 2020-09-10 MED ORDER — LIDOCAINE 2% (20 MG/ML) 5 ML SYRINGE
INTRAMUSCULAR | Status: DC | PRN
  Administered 2020-09-10: 40 mg via INTRAVENOUS

## 2020-09-10 NOTE — Interval H&P Note (Signed)
History and Physical Interval Note:  09/10/2020 10:03 AM  Laura Lee  has presented today for surgery, with the diagnosis of Afib.  The various methods of treatment have been discussed with the patient and family. After consideration of risks, benefits and other options for treatment, the patient has consented to  Procedure(s): CARDIOVERSION (N/A) as a surgical intervention.  The patient's history has been reviewed, patient examined, no change in status, stable for surgery.  I have reviewed the patient's chart and labs.  Questions were answered to the patient's satisfaction.     [name] A Zhoey Blackstock

## 2020-09-10 NOTE — Anesthesia Postprocedure Evaluation (Signed)
Anesthesia Post Note  Patient: Laura Lee  Procedure(s) Performed: CARDIOVERSION (N/A )     Patient location during evaluation: PACU Anesthesia Type: General Level of consciousness: awake and alert Pain management: pain level controlled Vital Signs Assessment: post-procedure vital signs reviewed and stable Respiratory status: spontaneous breathing, nonlabored ventilation and respiratory function stable Cardiovascular status: blood pressure returned to baseline and stable Postop Assessment: no apparent nausea or vomiting Anesthetic complications: no   No complications documented.  Last Vitals:  Vitals:   09/10/20 1106 09/10/20 1116  BP: (!) 89/50 (!) 93/54  Pulse: 62 63  Resp: (!) 9 10  Temp:    SpO2: 100% 100%    Last Pain:  Vitals:   09/10/20 1116  TempSrc:   PainSc: 0-No pain                 Audry Pili

## 2020-09-10 NOTE — Transfer of Care (Signed)
Immediate Anesthesia Transfer of Care Note  Patient: Laura Lee  Procedure(s) Performed: CARDIOVERSION (N/A )  Patient Location: Endoscopy Unit  Anesthesia Type:General  Level of Consciousness: drowsy  Airway & Oxygen Therapy: Patient Spontanous Breathing  Post-op Assessment: Report given to RN and Post -op Vital signs reviewed and stable  Post vital signs: Reviewed and stable  Last Vitals:  Vitals Value Taken Time  BP 91/58 09/10/20 1028  Temp    Pulse 64 09/10/20 1029  Resp 15 09/10/20 1029  SpO2 96 % 09/10/20 1029    Last Pain:  Vitals:   09/10/20 0956  TempSrc: Oral  PainSc: 0-No pain         Complications: No complications documented.

## 2020-09-10 NOTE — CV Procedure (Addendum)
   Electrical Cardioversion Procedure Note Laura Lee [id number] July 16, 1931  Procedure: Electrical Cardioversion Indications:  Atrial Fibrillation with heart failure  Time Out: Verified patient identification, verified procedure,medications/allergies/relevent history reviewed, required imaging and test results available.  Performed  Procedure Details  The patient was NPO after midnight. Anesthesia was administered at the beside  by Dr. Fransisco Beau with 60 mg of lidocaine and 40 mg propofol.  Cardioversion was done with synchronized biphasic defibrillation with AP pads with 200 Joules.  The patient converted to normal sinus rhythm. The patient tolerated the procedure well   IMPRESSION:  Successful cardioversion of atrial fibrillation Addendum:  Discussed with patient and son red flags that would require increase in lasix.  MAP 64 without medications to augment BP and patient feels improved from procedure.  Planned for DC unless new issues occur.    Laura Lee Laura Lee 09/10/2020, 10:24 AM

## 2020-09-10 NOTE — Interval H&P Note (Signed)
History and Physical Interval Note:  09/10/2020 10:03 AM  Leisa Lenz  has presented today for surgery, with the diagnosis of Afib.  The various methods of treatment have been discussed with the patient and family. After consideration of risks, benefits and other options for treatment, the patient has consented to  Procedure(s): CARDIOVERSION (N/A) as a surgical intervention.  The patient's history has been reviewed, patient examined, no change in status, stable for surgery.  I have reviewed the patient's chart and labs.  Questions were answered to the patient's satisfaction.     Izaih Kataoka A Orestes Geiman

## 2020-09-10 NOTE — Anesthesia Procedure Notes (Signed)
Procedure Name: General with mask airway Date/Time: 09/10/2020 10:15 AM Performed by: Colin Benton, CRNA Pre-anesthesia Checklist: Patient identified, Emergency Drugs available, Suction available and Patient being monitored Patient Re-evaluated:Patient Re-evaluated prior to induction Oxygen Delivery Method: Ambu bag Preoxygenation: Pre-oxygenation with 100% oxygen Induction Type: IV induction Ventilation: Mask ventilation without difficulty Placement Confirmation: positive ETCO2 Dental Injury: Teeth and Oropharynx as per pre-operative assessment

## 2020-09-10 NOTE — Discharge Instructions (Signed)
What can I expect after the procedure?  Your blood pressure, heart rate, breathing rate, and blood oxygen level will be monitored until you leave the hospital or clinic.  Your heart rhythm will be watched to make sure it does not change.  You may have some redness on the skin where the shocks were given. Follow these instructions at home:  Do not drive for 24 hours if you were given a sedative during your procedure.  Take over-the-counter and prescription medicines only as told by your health care provider.  Ask your health care provider how to check your pulse. Check it often.  Rest for 48 hours after the procedure or as told by your health care provider.  Avoid or limit your caffeine use as told by your health care provider.  Keep all follow-up visits as told by your health care provider. This is important. Contact a health care provider if:  You feel like your heart is beating too quickly or your pulse is not regular.  You have a serious muscle cramp that does not go away. Get help right away if:  You have discomfort in your chest.  You are dizzy or you feel faint.  You have trouble breathing or you are short of breath.  Your speech is slurred.  You have trouble moving an arm or leg on one side of your body.  Your fingers or toes turn cold or blue. Summary  Electrical cardioversion is the delivery of a jolt of electricity to restore a normal rhythm to the heart.  This procedure may be done right away in an emergency or may be a scheduled procedure if the condition is not an emergency.  Generally, this is a safe procedure.  After the procedure, check your pulse often as told by your health care provider. This information is not intended to replace advice given to you by your health care provider. Make sure you discuss any questions you have with your health care provider. Document Revised: 12/12/2018 Document Reviewed: 12/12/2018 Elsevier Patient Education  [place].

## 2020-09-10 NOTE — Anesthesia Preprocedure Evaluation (Addendum)
Anesthesia Evaluation  Patient identified by MRN, date of birth, ID band Patient awake    Reviewed: Allergy & Precautions, NPO status , Patient's Chart, lab work & pertinent test results  History of Anesthesia Complications Negative for: history of anesthetic complications  Airway Mallampati: II  TM Distance: >3 FB Neck ROM: Full    Dental  (+) Dental Advisory Given, Teeth Intact   Pulmonary neg pulmonary ROS,    Pulmonary exam normal        Cardiovascular hypertension, Pt. on medications +CHF  + dysrhythmias Atrial Fibrillation  Rhythm:Irregular Rate:Tachycardia   '18 TTE - mild LVH. EF 55%. Mild AI and MR. LA was moderately dilated.     Neuro/Psych negative neurological ROS  negative psych ROS   GI/Hepatic negative GI ROS, Neg liver ROS,   Endo/Other  Hypothyroidism   Renal/GU CRFRenal disease     Musculoskeletal negative musculoskeletal ROS (+)   Abdominal   Peds  Hematology  On eliquis    Anesthesia Other Findings Covid test negative   Reproductive/Obstetrics  Breast cancer                             Anesthesia Physical Anesthesia Plan  ASA: III  Anesthesia Plan: General   Post-op Pain Management:    Induction: Intravenous  PONV Risk Score and Plan: 3 and Treatment may vary due to age or medical condition and Propofol infusion  Airway Management Planned: Mask and Natural Airway  Additional Equipment: None  Intra-op Plan:   Post-operative Plan:   Informed Consent: I have reviewed the patients History and Physical, chart, labs and discussed the procedure including the risks, benefits and alternatives for the proposed anesthesia with the patient or authorized representative who has indicated his/her understanding and acceptance.       Plan Discussed with: CRNA and Anesthesiologist  Anesthesia Plan Comments:        Anesthesia Quick Evaluation

## 2020-09-11 ENCOUNTER — Encounter (HOSPITAL_COMMUNITY): Payer: Self-pay | Admitting: Internal Medicine

## 2020-09-16 ENCOUNTER — Encounter: Payer: Self-pay | Admitting: Student

## 2020-09-16 ENCOUNTER — Ambulatory Visit (INDEPENDENT_AMBULATORY_CARE_PROVIDER_SITE_OTHER): Payer: Medicare Other | Admitting: Student

## 2020-09-16 ENCOUNTER — Other Ambulatory Visit: Payer: Self-pay

## 2020-09-16 VITALS — BP 100/60 | HR 67 | Ht 66.0 in | Wt 168.0 lb

## 2020-09-16 DIAGNOSIS — I428 Other cardiomyopathies: Secondary | ICD-10-CM | POA: Diagnosis not present

## 2020-09-16 DIAGNOSIS — I5033 Acute on chronic diastolic (congestive) heart failure: Secondary | ICD-10-CM | POA: Diagnosis not present

## 2020-09-16 DIAGNOSIS — I4819 Other persistent atrial fibrillation: Secondary | ICD-10-CM

## 2020-09-16 LAB — BASIC METABOLIC PANEL WITH GFR
BUN/Creatinine Ratio: 17 (ref 12–28)
BUN: 17 mg/dL (ref 8–27)
CO2: 26 mmol/L (ref 20–29)
Calcium: 8.7 mg/dL (ref 8.7–10.3)
Chloride: 103 mmol/L (ref 96–106)
Creatinine, Ser: 1 mg/dL (ref 0.57–1.00)
Glucose: 93 mg/dL (ref 65–99)
Potassium: 4.1 mmol/L (ref 3.5–5.2)
Sodium: 141 mmol/L (ref 134–144)
eGFR: 55 mL/min/{1.73_m2} — ABNORMAL LOW

## 2020-09-16 NOTE — Patient Instructions (Signed)
Medication Instructions:  Your physician has recommended you make the following change in your medication:   CHANGE: Furosemide (Lasix) to 60mg  daily for 3 days then 40mg  daily  *If you need a refill on your cardiac medications before your next appointment, please call your pharmacy*   Lab Work: TODAY: BMET  If you have labs (blood work) drawn today and your tests are completely normal, you will receive your results only by: Marland Kitchen MyChart Message (if you have MyChart) OR . A paper copy in the mail If you have any lab test that is abnormal or we need to change your treatment, we will call you to review the results.   Follow-Up: At [facility], you and your health needs are our priority.  As part of our continuing mission to provide you with exceptional heart care, we have created designated Provider Care Teams.  These Care Teams include your primary Cardiologist (physician) and Advanced Practice Providers (APPs -  Physician Assistants and Nurse Practitioners) who all work together to provide you with the care you need, when you need it.  Your next appointment:   As scheduled

## 2020-09-16 NOTE — Progress Notes (Signed)
PCP:  Virgie Dad, MD Primary Cardiologist: Will Meredith Leeds, MD Electrophysiologist: [name], MD   Laura Lee is a 87 y.o. female seen today for Will Meredith Leeds, MD for post cardioversion follow up. .  Since last being seen in our clinic the patient reports doing OK. Her legs remains swollen despite cardioversion. She is monitoring fluid intake and sodium intake at [facility]. She denies dyspnea or orthopnea. She remains about 20 lbs up since February (baseline weight ~145 lbs). Compression stockings are especially uncomfortable on her L leg with a pinched nerve.    Past Medical History:  Diagnosis Date  . Atrial fibrillation ([place])   . Breast CA ([place])   . Cancer (H[facility])    Breast and thyroid cancer  . Glaucoma   . Hearing impaired    Hearing aids  . Hypertension   . Osteopenia 05/2013   T score -1.9 FRAX 15%/4.3%  . Thyroid disease    Cancer   Past Surgical History:  Procedure Laterality Date  . APPENDECTOMY    . BREAST EXCISIONAL BIOPSY Right    benign  . BREAST LUMPECTOMY     [id number] radiation  . BREAST SURGERY  1989   Lumpectomy right breast  . CARDIOVERSION N/A 02/14/2016   Procedure: CARDIOVERSION;  Surgeon: Sueanne Margarita, MD;  Location: [facility] ENDOSCOPY;  Service: Cardiovascular;  Laterality: N/A;  . CARDIOVERSION N/A 08/04/2018   Procedure: CARDIOVERSION;  Surgeon: Buford Dresser, MD;  Location: [facility] ENDOSCOPY;  Service: Cardiovascular;  Laterality: N/A;  . CARDIOVERSION N/A 10/31/2019   Procedure: CARDIOVERSION;  Surgeon: Dorothy Spark, MD;  Location: [facility] ENDOSCOPY;  Service: Cardiovascular;  Laterality: N/A;  . CARDIOVERSION N/A 05/02/2020   Procedure: CARDIOVERSION;  Surgeon: Elouise Munroe, MD;  Location: [facility] ENDOSCOPY;  Service: Cardiovascular;  Laterality: N/A;  . CARDIOVERSION N/A 09/10/2020   Procedure: CARDIOVERSION;  Surgeon: Werner Lean, MD;  Location: [facility] ENDOSCOPY;  Service: Cardiovascular;  Laterality: N/A;   . Mole excised     Benign  . THYROID SURGERY     Removed 1 lobe  . TONSILLECTOMY      Current Outpatient Medications  Medication Sig Dispense Refill  . amiodarone (PACERONE) 200 MG tablet Take 1 tablet (200 mg total) by mouth 2 (two) times daily. 180 tablet 3  . beta carotene w/minerals (OCUVITE) tablet Take 1 tablet by mouth at bedtime.    . Calcium Citrate-Vitamin D (CALCIUM CITRATE + D3 PO) Take 1 tablet by mouth 2 (two) times daily.    . carboxymethylcellulose (REFRESH PLUS) 0.5 % SOLN Place 1 drop into both eyes 3 (three) times daily as needed (dry/irritated eyes.).     Marland Kitchen cholecalciferol (VITAMIN D) 25 MCG (1000 UNIT) tablet Take 1,000 Units by mouth in the morning. chewables    . diltiazem (CARDIZEM CD) 300 MG 24 hr capsule Take 1 capsule (300 mg total) by mouth daily. 90 capsule 3  . ELIQUIS 5 MG TABS tablet Take 1 tablet (5 mg total) by mouth 2 (two) times daily. 60 tablet 6  . ENTRESTO 24-26 MG TAKE 1 TABLET BY MOUTH TWICE DAILY. 180 tablet 3  . furosemide (LASIX) 20 MG tablet Take 2 tablets (40 mg total) by mouth daily. 90 tablet 3  . glucosamine-chondroitin 500-400 MG tablet Take 1 tablet by mouth in the morning and at bedtime.    Marland Kitchen latanoprost (XALATAN) 0.005 % ophthalmic solution Place 1 drop into both eyes at bedtime.     Marland Kitchen levothyroxine (SYNTHROID)  88 MCG tablet TAKE 1 TABLET ONCE DAILY IN THE MORNING ON AN EMPTY STOMACH. 90 tablet 2  . Multiple Vitamin (MULTIVITAMIN WITH MINERALS) TABS tablet Take 1 tablet by mouth daily with lunch. Adults 50+    . oxyCODONE (OXY IR/ROXICODONE) 5 MG immediate release tablet TAKE 1 TABLET IN THE MORNING AND 1 TABLET AT BEDTIME. 60 tablet 0  . Polyethylene Glycol 400 (BLINK TEARS) 0.25 % SOLN Place 1-2 drops into both eyes 3 (three) times daily as needed (dry/irritated eyes).     No current facility-administered medications for this visit.    Allergies  Allergen Reactions  . Combigan [Brimonidine Tartrate-Timolol] Itching and Other (See  Comments)    Red eye   . Pneumococcal Vaccines Other (See Comments)    Pain in arms/legs--nerve issues    Social History   Socioeconomic History  . Marital status: Widowed    Spouse name: Not on file  . Number of children: 2  . Years of education: Not on file  . Highest education level: Doctorate  Occupational History  . Occupation: retired  Tobacco Use  . Smoking status: Never Smoker  . Smokeless tobacco: Never Used  Vaping Use  . Vaping Use: Never used  Substance and Sexual Activity  . Alcohol use: Yes    Comment: Rare  . Drug use: No  . Sexual activity: Never    Comment: 1st intercourse 69 yo-1 partner  Other Topics Concern  . Not on file  Social History Narrative  . Not on file   Social Determinants of Health   Financial Resource Strain: Not on file  Food Insecurity: Not on file  Transportation Needs: Not on file  Physical Activity: Not on file  Stress: Not on file  Social Connections: Not on file  Intimate Partner Violence: Not on file     Review of Systems: General: No chills, fever, night sweats or weight changes  Cardiovascular:  No chest pain, dyspnea on exertion, edema, orthopnea, palpitations, paroxysmal nocturnal dyspnea Dermatological: No rash, lesions or masses Respiratory: No cough, dyspnea Urologic: No hematuria, dysuria Abdominal: No nausea, vomiting, diarrhea, bright red blood per rectum, melena, or hematemesis Neurologic: No visual changes, weakness, changes in mental status All other systems reviewed and are otherwise negative except as noted above.  Physical Exam: Vitals:   09/16/20 1007  BP: 100/60  Pulse: 67  SpO2: 96%  Weight: 168 lb (76.2 kg)  Height: 5\' 6"  (1.676 m)    GEN- The patient is well appearing, alert and oriented x 3 today.   HEENT: normocephalic, atraumatic; sclera clear, conjunctiva pink; hearing intact; oropharynx clear; neck supple, no JVP Lymph- no cervical lymphadenopathy Lungs- Clear to ausculation  bilaterally, normal work of breathing.  No wheezes, rales, rhonchi Heart- Regular rate and rhythm, no murmurs, rubs or gallops, PMI not laterally displaced GI- soft, non-tender, non-distended, bowel sounds present, no hepatosplenomegaly Extremities- no clubbing, cyanosis, or edema; DP/PT/radial pulses 2+ bilaterally MS- no significant deformity or atrophy Skin- warm and dry, no rash or lesion Psych- euthymic mood, full affect Neuro- strength and sensation are intact  EKG is ordered. Personal review of EKG from today shows NSR at 67 bpm  Additional studies reviewed include: Previous EP office notes and recent labwork  Assessment and Plan:  1. Persistent atrial fibrillation Continue Eliquis for CHA2DS2VASC of at least 5. EKG today shows NSR as above S/p [facility] 04/2020, lasted for approx 2-2.5 months on flecainide.  Continue amiodarone 200 mg BID for now. Consider down-titration at next  visit.  Previously failed flecainide.  Continue diltiazem 300 mg daily. We consolidated this when she first began to have issues in March. Discussed with pharmacy and low suspicion that daily dosing would cause worsening swelling than short acting 4 times daily. She has tolerated BB poorly in the past.   Poor candidate for Tikosyn as would only be qualified for 250 mcg dose at best.  Can additionally assess candidacy for tikosyn based on labs, but would need amio wash out. (CrCl would also only qualify for starting dose of 250 mcg)  2. Acute on chronic diastolic CHF Echo 25% 01/5637. Update echo planned for 10/01/20, remains in NSR as above.  Has not tolerated beta-blockers in the past. Continue 40 mg daily.   Shirley Friar, PA-C  09/16/20 10:16 AM

## 2020-09-26 ENCOUNTER — Other Ambulatory Visit: Payer: Self-pay | Admitting: Cardiology

## 2020-10-01 ENCOUNTER — Other Ambulatory Visit: Payer: Self-pay

## 2020-10-01 ENCOUNTER — Ambulatory Visit (HOSPITAL_COMMUNITY): Payer: Medicare Other | Attending: Cardiology

## 2020-10-01 DIAGNOSIS — I4819 Other persistent atrial fibrillation: Secondary | ICD-10-CM | POA: Diagnosis not present

## 2020-10-01 LAB — ECHOCARDIOGRAM COMPLETE
Area-P 1/2: 2.62 cm2
P 1/2 time: 425 msec
S' Lateral: 4.1 cm
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
## 2020-10-07 ENCOUNTER — Other Ambulatory Visit: Payer: Self-pay

## 2020-10-07 ENCOUNTER — Ambulatory Visit (INDEPENDENT_AMBULATORY_CARE_PROVIDER_SITE_OTHER): Payer: Medicare Other | Admitting: Physician Assistant

## 2020-10-07 ENCOUNTER — Encounter: Payer: Self-pay | Admitting: Physician Assistant

## 2020-10-07 VITALS — BP 100/42 | HR 68 | Ht 66.0 in | Wt 149.8 lb

## 2020-10-07 DIAGNOSIS — I480 Paroxysmal atrial fibrillation: Secondary | ICD-10-CM

## 2020-10-07 DIAGNOSIS — Z79899 Other long term (current) drug therapy: Secondary | ICD-10-CM | POA: Diagnosis not present

## 2020-10-07 DIAGNOSIS — I420 Dilated cardiomyopathy: Secondary | ICD-10-CM | POA: Diagnosis not present

## 2020-10-07 DIAGNOSIS — I10 Essential (primary) hypertension: Secondary | ICD-10-CM | POA: Diagnosis not present

## 2020-10-07 MED ORDER — AMIODARONE HCL 200 MG PO TABS: 200.0000 mg | ORAL_TABLET | Freq: Every day | ORAL | 3 refills | Status: DC

## 2020-10-07 NOTE — Patient Instructions (Addendum)
Medication Instructions:  Your physician has recommended you make the following change in your medication:  1. DECREASE AMIODARONE 200 MG DAILY.  *If you need a refill on your cardiac medications before your next appointment, please call your pharmacy*   Lab Work: TODAY: LFTS, TSH If you have labs (blood work) drawn today and your tests are completely normal, you will receive your results only by: Marland Kitchen MyChart Message (if you have MyChart) OR . A paper copy in the mail If you have any lab test that is abnormal or we need to change your treatment, we will call you to review the results.   Testing/Procedures: NONE   Follow-Up: At [facility], you and your health needs are our priority.  As part of our continuing mission to provide you with exceptional heart care, we have created designated Provider Care Teams.  These Care Teams include your primary Cardiologist (physician) and Advanced Practice Providers (APPs -  Physician Assistants and Nurse Practitioners) who all work together to provide you with the care you need, when you need it.  We recommend signing up for the patient portal called "MyChart".  Sign up information is provided on this After Visit Summary.  MyChart is used to connect with patients for Virtual Visits (Telemedicine).  Patients are able to view lab/test results, encounter notes, upcoming appointments, etc.  Non-urgent messages can be sent to your provider as well.   To learn more about what you can do with MyChart, go to [email or web address].    Your next appointment:   2 week(s)  The format for your next appointment:   In Person  Provider:   You may see Will Meredith Leeds, MD or one of the following Advanced Practice Providers on your designated Care Team:    Tommye Standard, [place]  Legrand Como "[facility]" [place], [place]

## 2020-10-08 ENCOUNTER — Other Ambulatory Visit: Payer: Self-pay | Admitting: Nurse Practitioner

## 2020-10-08 LAB — HEPATIC FUNCTION PANEL
ALT: 13 IU/L (ref 0–32)
AST: 21 IU/L (ref 0–40)
Albumin: 4 g/dL (ref 3.6–4.6)
Alkaline Phosphatase: 90 IU/L (ref 44–121)
Bilirubin Total: 0.4 mg/dL (ref 0.0–1.2)
Bilirubin, Direct: 0.14 mg/dL (ref 0.00–0.40)
Total Protein: 7.6 g/dL (ref 6.0–8.5)

## 2020-10-08 LAB — TSH: TSH: 5.4 u[IU]/mL — ABNORMAL HIGH (ref 0.450–4.500)

## 2020-10-09 ENCOUNTER — Other Ambulatory Visit: Payer: Self-pay | Admitting: *Deleted

## 2020-10-09 DIAGNOSIS — Z79899 Other long term (current) drug therapy: Secondary | ICD-10-CM

## 2020-10-14 ENCOUNTER — Other Ambulatory Visit: Payer: Self-pay | Admitting: Cardiology

## 2020-10-14 DIAGNOSIS — I4819 Other persistent atrial fibrillation: Secondary | ICD-10-CM

## 2020-10-14 NOTE — Telephone Encounter (Signed)
Prescription refill request for Eliquis received. Indication: A Fib Last office visit: 10/07/20 Scr: 1.00 Age: 87 Weight: 67kg

## 2020-10-16 ENCOUNTER — Other Ambulatory Visit: Payer: Self-pay | Admitting: *Deleted

## 2020-10-16 DIAGNOSIS — Z79899 Other long term (current) drug therapy: Secondary | ICD-10-CM

## 2020-10-22 ENCOUNTER — Other Ambulatory Visit: Payer: Medicare Other | Admitting: *Deleted

## 2020-10-22 ENCOUNTER — Encounter: Payer: Self-pay | Admitting: Cardiology

## 2020-10-22 ENCOUNTER — Ambulatory Visit (INDEPENDENT_AMBULATORY_CARE_PROVIDER_SITE_OTHER): Payer: Medicare Other | Admitting: Cardiology

## 2020-10-22 ENCOUNTER — Other Ambulatory Visit: Payer: Self-pay

## 2020-10-22 VITALS — BP 114/72 | HR 59 | Ht 66.0 in | Wt 146.0 lb

## 2020-10-22 DIAGNOSIS — Z79899 Other long term (current) drug therapy: Secondary | ICD-10-CM

## 2020-10-22 DIAGNOSIS — I4819 Other persistent atrial fibrillation: Secondary | ICD-10-CM | POA: Diagnosis not present

## 2020-10-22 LAB — BASIC METABOLIC PANEL
Anion gap: 8 (ref 5–15)
BUN: 25 mg/dL — ABNORMAL HIGH (ref 8–23)
CO2: 33 mmol/L — ABNORMAL HIGH (ref 22–32)
Calcium: 9.4 mg/dL (ref 8.9–10.3)
Chloride: 97 mmol/L — ABNORMAL LOW (ref 98–111)
Creatinine, Ser: 0.7 mg/dL (ref 0.44–1.00)
GFR, Estimated: >60 mL/min (ref >60)
Glucose, Bld: 104 mg/dL — ABNORMAL HIGH (ref 70–99)
Potassium: 4 mmol/L (ref 3.5–5.1)
Sodium: 138 mmol/L (ref 135–145)
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
## 2020-10-30 DIAGNOSIS — Z23 Encounter for immunization: Secondary | ICD-10-CM | POA: Diagnosis not present

## 2020-11-13 ENCOUNTER — Non-Acute Institutional Stay: Payer: Medicare Other | Admitting: Internal Medicine

## 2020-11-13 ENCOUNTER — Encounter: Payer: Self-pay | Admitting: Internal Medicine

## 2020-11-13 ENCOUNTER — Other Ambulatory Visit: Payer: Self-pay

## 2020-11-13 VITALS — BP 114/80 | HR 62 | Temp 97.0°F | Ht 66.0 in | Wt 146.1 lb

## 2020-11-13 DIAGNOSIS — R600 Localized edema: Secondary | ICD-10-CM

## 2020-11-13 DIAGNOSIS — E039 Hypothyroidism, unspecified: Secondary | ICD-10-CM | POA: Diagnosis not present

## 2020-11-13 DIAGNOSIS — N1831 Chronic kidney disease, stage 3a: Secondary | ICD-10-CM | POA: Diagnosis not present

## 2020-11-13 DIAGNOSIS — I480 Paroxysmal atrial fibrillation: Secondary | ICD-10-CM | POA: Diagnosis not present

## 2020-11-13 DIAGNOSIS — S12100S Unspecified displaced fracture of second cervical vertebra, sequela: Secondary | ICD-10-CM | POA: Diagnosis not present

## 2020-11-13 DIAGNOSIS — I5022 Chronic systolic (congestive) heart failure: Secondary | ICD-10-CM

## 2020-11-13 DIAGNOSIS — I10 Essential (primary) hypertension: Secondary | ICD-10-CM

## 2020-11-13 MED ORDER — POTASSIUM CHLORIDE CRYS ER 10 MEQ PO TBCR
10.0000 meq | EXTENDED_RELEASE_TABLET | Freq: Once | ORAL | 2 refills | Status: DC
Start: 2020-11-13 — End: 2020-11-13

## 2020-11-13 MED ORDER — AQUAPHOR ADVANCED THERAPY EX OINT: 1.0000 | TOPICAL_OINTMENT | Freq: Two times a day (BID) | CUTANEOUS | 2 refills | Status: DC

## 2020-11-13 NOTE — Patient Instructions (Addendum)
Take 2 tabs of Furosemide twice a day for 7 days and then go back to 2 tabs a day. Also take Potassium 10 meq once a day for 7 days and then stop
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
## 2020-11-20 ENCOUNTER — Other Ambulatory Visit: Payer: Self-pay | Admitting: Internal Medicine

## 2020-11-20 DIAGNOSIS — E039 Hypothyroidism, unspecified: Secondary | ICD-10-CM | POA: Diagnosis not present

## 2020-11-20 DIAGNOSIS — R600 Localized edema: Secondary | ICD-10-CM | POA: Diagnosis not present

## 2020-11-21 ENCOUNTER — Other Ambulatory Visit: Payer: Self-pay

## 2020-11-21 DIAGNOSIS — E039 Hypothyroidism, unspecified: Secondary | ICD-10-CM

## 2020-11-21 DIAGNOSIS — R600 Localized edema: Secondary | ICD-10-CM

## 2020-11-22 ENCOUNTER — Telehealth: Payer: Self-pay | Admitting: Internal Medicine

## 2020-11-22 LAB — BASIC METABOLIC PANEL WITH GFR
BUN/Creatinine Ratio: 23 (calc) — ABNORMAL HIGH (ref 6–22)
BUN: 26 mg/dL — ABNORMAL HIGH (ref 7–25)
CO2: 29 mmol/L (ref 20–32)
Calcium: 9 mg/dL (ref 8.6–10.4)
Chloride: 100 mmol/L (ref 98–110)
Creat: 1.12 mg/dL — ABNORMAL HIGH (ref 0.60–0.88)
GFR, Est African American: 52 mL/min/{1.73_m2} — ABNORMAL LOW (ref >=60)
GFR, Est Non African American: 44 mL/min/{1.73_m2} — ABNORMAL LOW (ref >=60)
Glucose, Bld: 78 mg/dL (ref 65–99)
Potassium: 3.9 mmol/L (ref 3.5–5.3)
Sodium: 139 mmol/L (ref 135–146)

## 2020-11-22 LAB — TSH: TSH: 7.01 mIU/L — ABNORMAL HIGH (ref 0.40–4.50)

## 2020-11-22 MED ORDER — POTASSIUM CHLORIDE CRYS ER 10 MEQ PO TBCR: 10.0000 meq | EXTENDED_RELEASE_TABLET | Freq: Every day | ORAL | 2 refills | Status: DC

## 2020-11-22 NOTE — Telephone Encounter (Addendum)
Talked to the daughter and Patient . Her labs look good. Her swelling is better in her legs. Will continue Lasix 40 mg BID for 3 days a week and 40 mg QD 4 times a week .Also continue taking potassium. Will check BMP in 4 weeks. Will discuss about Elevated TSH in next visit

## 2020-12-09 ENCOUNTER — Other Ambulatory Visit: Payer: Self-pay | Admitting: Nurse Practitioner

## 2020-12-09 NOTE — Telephone Encounter (Signed)
Medication refill request received for Oxycodone 5 mg tablet. Medication pended and sent to Windell Moulding, NP for approval.

## 2020-12-11 ENCOUNTER — Encounter: Payer: Self-pay | Admitting: Internal Medicine

## 2020-12-11 ENCOUNTER — Other Ambulatory Visit: Payer: Self-pay

## 2020-12-11 ENCOUNTER — Non-Acute Institutional Stay: Payer: Medicare Other | Admitting: Internal Medicine

## 2020-12-11 VITALS — BP 136/80 | HR 56 | Temp 96.9°F | Ht 66.0 in | Wt 143.7 lb

## 2020-12-11 DIAGNOSIS — N1831 Chronic kidney disease, stage 3a: Secondary | ICD-10-CM | POA: Diagnosis not present

## 2020-12-11 DIAGNOSIS — L03116 Cellulitis of left lower limb: Secondary | ICD-10-CM

## 2020-12-11 DIAGNOSIS — I480 Paroxysmal atrial fibrillation: Secondary | ICD-10-CM

## 2020-12-11 DIAGNOSIS — R600 Localized edema: Secondary | ICD-10-CM | POA: Diagnosis not present

## 2020-12-11 DIAGNOSIS — S12100S Unspecified displaced fracture of second cervical vertebra, sequela: Secondary | ICD-10-CM

## 2020-12-11 DIAGNOSIS — I10 Essential (primary) hypertension: Secondary | ICD-10-CM | POA: Diagnosis not present

## 2020-12-11 DIAGNOSIS — I5022 Chronic systolic (congestive) heart failure: Secondary | ICD-10-CM

## 2020-12-11 DIAGNOSIS — E039 Hypothyroidism, unspecified: Secondary | ICD-10-CM

## 2020-12-11 MED ORDER — DOXYCYCLINE HYCLATE 100 MG PO TABS: 100.0000 mg | ORAL_TABLET | Freq: Two times a day (BID) | ORAL | 0 refills | Status: DC

## 2020-12-11 MED ORDER — FUROSEMIDE 20 MG PO TABS: 40.0000 mg | ORAL_TABLET | Freq: Every day | ORAL | 3 refills | Status: DC

## 2020-12-11 MED ORDER — POTASSIUM CHLORIDE CRYS ER 10 MEQ PO TBCR: 20.0000 meq | EXTENDED_RELEASE_TABLET | Freq: Every day | ORAL | 2 refills | Status: DC

## 2020-12-11 NOTE — Patient Instructions (Addendum)
Take Lasix 2 tabs twice a day for now Take Potassium 2 tabs 20 meq with it Also called in Antibiotics
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
## 2020-12-24 ENCOUNTER — Other Ambulatory Visit: Payer: Self-pay | Admitting: Nurse Practitioner

## 2020-12-24 ENCOUNTER — Other Ambulatory Visit: Payer: Self-pay

## 2020-12-24 ENCOUNTER — Encounter: Payer: Self-pay | Admitting: Nurse Practitioner

## 2020-12-24 ENCOUNTER — Non-Acute Institutional Stay: Payer: Medicare Other | Admitting: Nurse Practitioner

## 2020-12-24 ENCOUNTER — Telehealth: Payer: Self-pay | Admitting: *Deleted

## 2020-12-24 DIAGNOSIS — K5901 Slow transit constipation: Secondary | ICD-10-CM

## 2020-12-24 DIAGNOSIS — I10 Essential (primary) hypertension: Secondary | ICD-10-CM

## 2020-12-24 DIAGNOSIS — I5022 Chronic systolic (congestive) heart failure: Secondary | ICD-10-CM | POA: Diagnosis not present

## 2020-12-24 DIAGNOSIS — L03116 Cellulitis of left lower limb: Secondary | ICD-10-CM

## 2020-12-24 DIAGNOSIS — E039 Hypothyroidism, unspecified: Secondary | ICD-10-CM

## 2020-12-24 DIAGNOSIS — S12100S Unspecified displaced fracture of second cervical vertebra, sequela: Secondary | ICD-10-CM

## 2020-12-24 DIAGNOSIS — I480 Paroxysmal atrial fibrillation: Secondary | ICD-10-CM | POA: Diagnosis not present

## 2020-12-24 DIAGNOSIS — N1831 Chronic kidney disease, stage 3a: Secondary | ICD-10-CM

## 2020-12-24 DIAGNOSIS — L0390 Cellulitis, unspecified: Secondary | ICD-10-CM | POA: Insufficient documentation

## 2020-12-24 MED ORDER — DICLOFENAC SODIUM 3 % EX GEL: 100.0000 g | Freq: Four times a day (QID) | CUTANEOUS | 2 refills | Status: DC

## 2020-12-24 NOTE — Assessment & Plan Note (Signed)
takes Levothyroxine 68mg qd. TSH 7.01 11/20/20

## 2020-12-24 NOTE — Assessment & Plan Note (Addendum)
LLE cellulitis, treated with 7 day course of Doxy since 12/11/20, no pain, mild warmth, erythematous. Improving slowly. Will apply 3% Diclofenac gel 4x a day to reduce inflammation. Venous Doppler to r/o DVT even if the patient is taking Eliquis but she stated she has never had anything like this before.

## 2020-12-24 NOTE — Assessment & Plan Note (Signed)
s/p cardioversion-failed,  takes Eliquis, Diltiazem, Amiodarone.

## 2020-12-24 NOTE — Progress Notes (Signed)
ordered

## 2020-12-24 NOTE — Telephone Encounter (Signed)
Patient called and stated that she saw [facility] today and she ordered for her to have Imaging done but did not tell her where to go or what to do. Patient stated that she was told it was going to be at [facility] but no details were given to her.   There is not a order/referral in Epic.   Labs/tests ordered:  venous US left lower leg.     Please Advise.

## 2020-12-24 NOTE — Assessment & Plan Note (Signed)
risk for decompensation, chronic edema BLE. Takes Furosemide, Entresto, Bun/creat 26/1.12 11/20/20

## 2020-12-24 NOTE — Telephone Encounter (Signed)
Mast, Man X, NP  You 16 minutes ago (3:21 PM)   Did it again. Thanks.

## 2020-12-24 NOTE — Assessment & Plan Note (Signed)
Bun/creat 26/1.12 eGFR 44 11/20/20
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
## 2020-12-24 NOTE — Assessment & Plan Note (Signed)
takes Diltiazem Bun/creat 26/1.12 11/20/20

## 2020-12-24 NOTE — Assessment & Plan Note (Signed)
Constipation takes Senokot S
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
## 2020-12-29 ENCOUNTER — Other Ambulatory Visit: Payer: Self-pay | Admitting: Cardiology

## 2020-12-29 DIAGNOSIS — I4819 Other persistent atrial fibrillation: Secondary | ICD-10-CM

## 2020-12-30 NOTE — Telephone Encounter (Signed)
Eliquis mg refill request received. Patient is 87 years old, weight- 64.4 kg, Crea 1.12 and last seen by WC on 10/12/20 . Dose is appropriate based on dosing criteria. Will send in refill to requested pharmacy.

## 2021-01-01 ENCOUNTER — Encounter: Payer: Self-pay | Admitting: Internal Medicine

## 2021-01-01 ENCOUNTER — Other Ambulatory Visit: Payer: Self-pay

## 2021-01-01 ENCOUNTER — Non-Acute Institutional Stay: Payer: Medicare Other | Admitting: Internal Medicine

## 2021-01-01 VITALS — BP 138/82 | HR 63 | Temp 97.2°F | Ht 66.0 in | Wt 143.3 lb

## 2021-01-01 DIAGNOSIS — I480 Paroxysmal atrial fibrillation: Secondary | ICD-10-CM | POA: Diagnosis not present

## 2021-01-01 DIAGNOSIS — N1831 Chronic kidney disease, stage 3a: Secondary | ICD-10-CM

## 2021-01-01 DIAGNOSIS — R600 Localized edema: Secondary | ICD-10-CM

## 2021-01-01 DIAGNOSIS — I5022 Chronic systolic (congestive) heart failure: Secondary | ICD-10-CM

## 2021-01-01 DIAGNOSIS — E039 Hypothyroidism, unspecified: Secondary | ICD-10-CM | POA: Diagnosis not present

## 2021-01-01 DIAGNOSIS — I10 Essential (primary) hypertension: Secondary | ICD-10-CM | POA: Diagnosis not present

## 2021-01-01 MED ORDER — LEVOTHYROXINE SODIUM 100 MCG PO TABS: 100.0000 ug | ORAL_TABLET | Freq: Every day | ORAL | 3 refills | Status: DC

## 2021-01-01 MED ORDER — TRIAMCINOLONE ACETONIDE 0.1 % EX OINT: 1.0000 "application " | TOPICAL_OINTMENT | Freq: Every day | CUTANEOUS | 0 refills | Status: DC

## 2021-01-01 NOTE — Patient Instructions (Signed)
Put Triamcinolone Ointment one a day for 2 weeks to see if it helps the redness Will call you with your dermatology Appointment Will also call in new dose for Synthroid medicine

## 2021-01-02 ENCOUNTER — Other Ambulatory Visit: Payer: Self-pay

## 2021-01-02 ENCOUNTER — Ambulatory Visit (HOSPITAL_COMMUNITY)
Admission: RE | Admit: 2021-01-02 | Discharge: 2021-01-02 | Disposition: A | Discharge status: Home / Self Care | Payer: Medicare Other | Source: Ambulatory Visit | Attending: Nurse Practitioner | Admitting: Nurse Practitioner

## 2021-01-02 DIAGNOSIS — L03116 Cellulitis of left lower limb: Secondary | ICD-10-CM | POA: Diagnosis not present

## 2021-01-02 NOTE — Progress Notes (Signed)
Left lower extremity venous duplex completed. Refer to "CV Proc" under chart review to view preliminary results.  01/02/2021 8:39 AM Kelby Aline., MHA, RVT, RDCS, RDMS
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
## 2021-01-30 DIAGNOSIS — L820 Inflamed seborrheic keratosis: Secondary | ICD-10-CM | POA: Diagnosis not present

## 2021-01-30 DIAGNOSIS — D485 Neoplasm of uncertain behavior of skin: Secondary | ICD-10-CM | POA: Diagnosis not present

## 2021-01-30 DIAGNOSIS — D229 Melanocytic nevi, unspecified: Secondary | ICD-10-CM | POA: Diagnosis not present

## 2021-01-30 DIAGNOSIS — L814 Other melanin hyperpigmentation: Secondary | ICD-10-CM | POA: Diagnosis not present

## 2021-01-30 DIAGNOSIS — I872 Venous insufficiency (chronic) (peripheral): Secondary | ICD-10-CM | POA: Diagnosis not present

## 2021-01-30 DIAGNOSIS — L570 Actinic keratosis: Secondary | ICD-10-CM | POA: Diagnosis not present

## 2021-01-30 DIAGNOSIS — L821 Other seborrheic keratosis: Secondary | ICD-10-CM | POA: Diagnosis not present

## 2021-02-03 ENCOUNTER — Other Ambulatory Visit: Payer: Self-pay | Admitting: Internal Medicine

## 2021-02-05 ENCOUNTER — Encounter: Payer: Self-pay | Admitting: Internal Medicine

## 2021-02-05 ENCOUNTER — Other Ambulatory Visit: Payer: Self-pay

## 2021-02-05 ENCOUNTER — Non-Acute Institutional Stay: Payer: Medicare Other | Admitting: Internal Medicine

## 2021-02-05 VITALS — BP 136/88 | HR 58 | Temp 96.4°F | Ht 66.0 in | Wt 143.7 lb

## 2021-02-05 DIAGNOSIS — M7989 Other specified soft tissue disorders: Secondary | ICD-10-CM | POA: Diagnosis not present

## 2021-02-05 DIAGNOSIS — E039 Hypothyroidism, unspecified: Secondary | ICD-10-CM

## 2021-02-05 DIAGNOSIS — I5022 Chronic systolic (congestive) heart failure: Secondary | ICD-10-CM | POA: Diagnosis not present

## 2021-02-05 DIAGNOSIS — I10 Essential (primary) hypertension: Secondary | ICD-10-CM | POA: Diagnosis not present

## 2021-02-05 DIAGNOSIS — N1831 Chronic kidney disease, stage 3a: Secondary | ICD-10-CM | POA: Diagnosis not present

## 2021-02-05 DIAGNOSIS — I480 Paroxysmal atrial fibrillation: Secondary | ICD-10-CM

## 2021-02-05 NOTE — Patient Instructions (Addendum)
You can take extra lasix if you Legs feel Swollen. Take extra potassium also that day. Call Cardiology office and make sure you have follow up arrange with them. You do need Tetanus shot. You can get it from Pharmacy. Call them before going for the shot.
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
## 2021-02-10 ENCOUNTER — Other Ambulatory Visit: Payer: Self-pay | Admitting: Orthopedic Surgery

## 2021-02-10 NOTE — Telephone Encounter (Signed)
Pharmacy requested refill.  Last Epic RF: 12/09/2020 Pended Rx and sent to Dr. Lyndel Safe for approval.

## 2021-02-12 DIAGNOSIS — Z23 Encounter for immunization: Secondary | ICD-10-CM | POA: Diagnosis not present

## 2021-02-25 DIAGNOSIS — H35359 Cystoid macular degeneration, unspecified eye: Secondary | ICD-10-CM | POA: Diagnosis not present

## 2021-02-27 DIAGNOSIS — Z23 Encounter for immunization: Secondary | ICD-10-CM | POA: Diagnosis not present

## 2021-03-05 DIAGNOSIS — D485 Neoplasm of uncertain behavior of skin: Secondary | ICD-10-CM | POA: Diagnosis not present

## 2021-03-05 DIAGNOSIS — D229 Melanocytic nevi, unspecified: Secondary | ICD-10-CM | POA: Diagnosis not present

## 2021-03-05 DIAGNOSIS — I872 Venous insufficiency (chronic) (peripheral): Secondary | ICD-10-CM | POA: Diagnosis not present

## 2021-03-05 DIAGNOSIS — L570 Actinic keratosis: Secondary | ICD-10-CM | POA: Diagnosis not present

## 2021-03-05 DIAGNOSIS — L814 Other melanin hyperpigmentation: Secondary | ICD-10-CM | POA: Diagnosis not present

## 2021-03-27 ENCOUNTER — Telehealth: Payer: Self-pay | Admitting: Cardiology

## 2021-03-27 DIAGNOSIS — L820 Inflamed seborrheic keratosis: Secondary | ICD-10-CM | POA: Diagnosis not present

## 2021-03-27 DIAGNOSIS — D229 Melanocytic nevi, unspecified: Secondary | ICD-10-CM | POA: Diagnosis not present

## 2021-03-27 DIAGNOSIS — I872 Venous insufficiency (chronic) (peripheral): Secondary | ICD-10-CM | POA: Diagnosis not present

## 2021-03-27 DIAGNOSIS — L570 Actinic keratosis: Secondary | ICD-10-CM | POA: Diagnosis not present

## 2021-03-27 NOTE — Telephone Encounter (Signed)
Talked with pt and son. Aware that if unilateral swelling then not heart related and needed another MD to evaluation. Pt has been dealing with some LL cellulitis/treatment. Son reports that it is hard to know if r/t heart/afib because she is only wearing ted hose on one leg. Aware that we can discuss/evaluate further at [facility] week after next, but most likely not heart related. They appreciate my follow up call.

## 2021-03-27 NOTE — Telephone Encounter (Signed)
Pt c/o swelling: STAT is pt has developed SOB within 24 hours  If swelling, where is the swelling located? legs  How much weight have you gained and in what time span? Not sure  Have you gained 3 pounds in a day or 5 pounds in a week? Not sure  Do you have a log of your daily weights (if so, list)? no  Are you currently taking a fluid pill? Not sure  Are you currently SOB? Not sure  Have you traveled recently? Not sure   Patient's son states he received a call from [facility] requesting he schedule the patient an appointment due to her leg swelling. He states he does not have any more information on the swelling. He states he also does not have the number for [facility] memorized. Patient has an appointment 04/08/2021 with Tommye Standard.
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
## 2021-04-07 ENCOUNTER — Other Ambulatory Visit: Payer: Self-pay | Admitting: Internal Medicine

## 2021-04-07 NOTE — Telephone Encounter (Signed)
Patient has request refill on medication "Oxycodone". Patient last refill on medication was 02/10/2021. Patient has Opioid Contract on file from 12/17/2020. Patient medication pend and sent to Windell Moulding, NP. Please Advise.

## 2021-04-08 ENCOUNTER — Encounter: Payer: Self-pay | Admitting: Physician Assistant

## 2021-04-08 ENCOUNTER — Ambulatory Visit (INDEPENDENT_AMBULATORY_CARE_PROVIDER_SITE_OTHER): Payer: Medicare Other | Admitting: Physician Assistant

## 2021-04-08 ENCOUNTER — Other Ambulatory Visit: Payer: Self-pay

## 2021-04-08 VITALS — BP 102/58 | HR 60 | Ht 66.0 in | Wt 140.6 lb

## 2021-04-08 DIAGNOSIS — I420 Dilated cardiomyopathy: Secondary | ICD-10-CM

## 2021-04-08 DIAGNOSIS — I480 Paroxysmal atrial fibrillation: Secondary | ICD-10-CM

## 2021-04-08 DIAGNOSIS — I10 Essential (primary) hypertension: Secondary | ICD-10-CM | POA: Diagnosis not present

## 2021-04-08 DIAGNOSIS — I872 Venous insufficiency (chronic) (peripheral): Secondary | ICD-10-CM

## 2021-04-08 DIAGNOSIS — Z79899 Other long term (current) drug therapy: Secondary | ICD-10-CM | POA: Diagnosis not present

## 2021-04-08 NOTE — Patient Instructions (Signed)
Medication Instructions:    Your physician recommends that you continue on your current medications as directed. Please refer to the Current Medication list given to you today.  *If you need a refill on your cardiac medications before your next appointment, please call your pharmacy*   Lab Work:  CMET CBC AND TSH TODAY   If you have labs (blood work) drawn today and your tests are completely normal, you will receive your results only by: [place] (if you have MyChart) OR A paper copy in the mail If you have any lab test that is abnormal or we need to change your treatment, we will call you to review the results.   Testing/Procedures: NONE ORDERED  TODAY    Follow-Up: At [facility], you and your health needs are our priority.  As part of our continuing mission to provide you with exceptional heart care, we have created designated Provider Care Teams.  These Care Teams include your primary Cardiologist (physician) and Advanced Practice Providers (APPs -  Physician Assistants and Nurse Practitioners) who all work together to provide you with the care you need, when you need it.  We recommend signing up for the patient portal called "MyChart".  Sign up information is provided on this After Visit Summary.  MyChart is used to connect with patients for Virtual Visits (Telemedicine).  Patients are able to view lab/test results, encounter notes, upcoming appointments, etc.  Non-urgent messages can be sent to your provider as well.   To learn more about what you can do with MyChart, go to [email or web address].    Your next appointment:   6-8 week(s)  The format for your next appointment:   In Person  Provider:   You will see one of the following Advanced Practice Providers on your designated Care Team:   Tommye Standard, PA-C    :1}    Other Instructions

## 2021-04-09 ENCOUNTER — Non-Acute Institutional Stay: Payer: Medicare Other | Admitting: Internal Medicine

## 2021-04-09 ENCOUNTER — Encounter: Payer: Self-pay | Admitting: Internal Medicine

## 2021-04-09 VITALS — BP 121/70 | HR 64 | Temp 96.4°F | Ht 66.0 in | Wt 140.8 lb

## 2021-04-09 DIAGNOSIS — S12100S Unspecified displaced fracture of second cervical vertebra, sequela: Secondary | ICD-10-CM | POA: Diagnosis not present

## 2021-04-09 DIAGNOSIS — I480 Paroxysmal atrial fibrillation: Secondary | ICD-10-CM

## 2021-04-09 DIAGNOSIS — I5022 Chronic systolic (congestive) heart failure: Secondary | ICD-10-CM

## 2021-04-09 DIAGNOSIS — H9193 Unspecified hearing loss, bilateral: Secondary | ICD-10-CM | POA: Diagnosis not present

## 2021-04-09 DIAGNOSIS — E039 Hypothyroidism, unspecified: Secondary | ICD-10-CM | POA: Diagnosis not present

## 2021-04-09 DIAGNOSIS — M85852 Other specified disorders of bone density and structure, left thigh: Secondary | ICD-10-CM | POA: Diagnosis not present

## 2021-04-09 DIAGNOSIS — I10 Essential (primary) hypertension: Secondary | ICD-10-CM

## 2021-04-09 DIAGNOSIS — M7989 Other specified soft tissue disorders: Secondary | ICD-10-CM

## 2021-04-09 DIAGNOSIS — N1831 Chronic kidney disease, stage 3a: Secondary | ICD-10-CM

## 2021-04-09 DIAGNOSIS — M85851 Other specified disorders of bone density and structure, right thigh: Secondary | ICD-10-CM | POA: Diagnosis not present

## 2021-04-09 LAB — CBC
Hematocrit: 43.2 % (ref 34.0–46.6)
Hemoglobin: 13.9 g/dL (ref 11.1–15.9)
MCH: 28.5 pg (ref 26.6–33.0)
MCHC: 32.2 g/dL (ref 31.5–35.7)
MCV: 89 fL (ref 79–97)
RBC: 4.88 x10E6/uL (ref 3.77–5.28)
RDW: 13.1 % (ref 11.7–15.4)
WBC: 6.7 10*3/uL (ref 3.4–10.8)

## 2021-04-09 LAB — COMPREHENSIVE METABOLIC PANEL
ALT: 19 IU/L (ref 0–32)
AST: 24 IU/L (ref 0–40)
Albumin/Globulin Ratio: 1.2 (ref 1.2–2.2)
Albumin: 4.1 g/dL (ref 3.6–4.6)
Alkaline Phosphatase: 82 IU/L (ref 44–121)
BUN/Creatinine Ratio: 18 (ref 12–28)
BUN: 22 mg/dL (ref 8–27)
Bilirubin Total: 0.4 mg/dL (ref 0.0–1.2)
CO2: 27 mmol/L (ref 20–29)
Calcium: 9.2 mg/dL (ref 8.7–10.3)
Chloride: 99 mmol/L (ref 96–106)
Creatinine, Ser: 1.21 mg/dL — ABNORMAL HIGH (ref 0.57–1.00)
Globulin, Total: 3.3 g/dL (ref 1.5–4.5)
Glucose: 89 mg/dL (ref 70–99)
Potassium: 4.6 mmol/L (ref 3.5–5.2)
Sodium: 140 mmol/L (ref 134–144)
Total Protein: 7.4 g/dL (ref 6.0–8.5)
eGFR: 44 mL/min/{1.73_m2} — ABNORMAL LOW (ref >59)

## 2021-04-09 LAB — TSH: TSH: 4.02 u[IU]/mL (ref 0.450–4.500)
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
## 2021-04-09 NOTE — Patient Instructions (Signed)
I am Ordering Bone density Scan for you in [facility]. Once you have test done we can talk about treatment is required. You also need to get Tetanus shot.

## 2021-04-14 ENCOUNTER — Other Ambulatory Visit: Payer: Self-pay | Admitting: Cardiology

## 2021-04-14 DIAGNOSIS — I4819 Other persistent atrial fibrillation: Secondary | ICD-10-CM

## 2021-04-14 NOTE — Telephone Encounter (Signed)
Pt last saw Tommye Standard, [place] on 04/08/21, last labs 04/08/21 Creat 1.21, age 87, weight 63.9kg, based on specified criteria pt is on appropriate dosage of Eliquis 5mg  BID for afib.  Will refill rx.

## 2021-04-15 ENCOUNTER — Other Ambulatory Visit: Payer: Self-pay | Admitting: Internal Medicine

## 2021-04-15 NOTE — Telephone Encounter (Signed)
Patient has request refill on medication "Lasix". Patient last refill was 12/11/2020. Patient was given 90 doses and medication has end date. Im unsure why this is. Medication pend and sent to Windell Moulding, NP. Please Advise.

## 2021-04-24 DIAGNOSIS — L814 Other melanin hyperpigmentation: Secondary | ICD-10-CM | POA: Diagnosis not present

## 2021-04-24 DIAGNOSIS — I872 Venous insufficiency (chronic) (peripheral): Secondary | ICD-10-CM | POA: Diagnosis not present

## 2021-04-24 DIAGNOSIS — D485 Neoplasm of uncertain behavior of skin: Secondary | ICD-10-CM | POA: Diagnosis not present

## 2021-04-24 DIAGNOSIS — L570 Actinic keratosis: Secondary | ICD-10-CM | POA: Diagnosis not present

## 2021-04-27 IMAGING — CT CT CHEST W/ CM
2 of 5 series · 13 of 36 positions shown, 16 images · IV contrast (APPLIED)
Comparison: MR thoracic spine, 10/27/2019, CT head and neck
angiogram, 10/26/2019

CLINICAL DATA: Thoracic spine bone lesion, malignancy suspected

EXAM:
CT CHEST, ABDOMEN, AND PELVIS WITH CONTRAST
TECHNIQUE: Multidetector CT imaging of the chest, abdomen and pelvis was
performed following the standard protocol during bolus
administration of intravenous contrast.
CONTRAST:  100mL OMNIPAQUE IOHEXOL 300 MG/ML  SOLN

[Series 3: cap 5.0 i31f 2 · axial · 0.90mm/px · z∈[-509,+6]mm · 10 of 127 slices shown, 13 images]
[im 12/127  mediastinal]
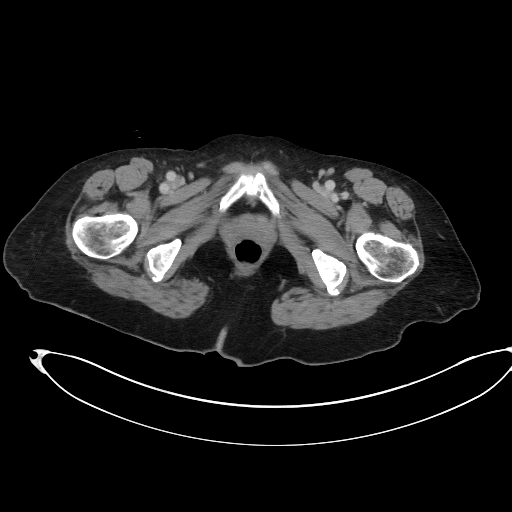
[im 12/127  lung]
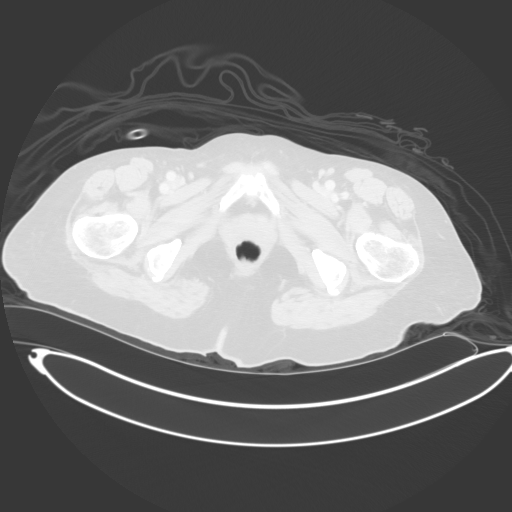
[im 23/127  lung]
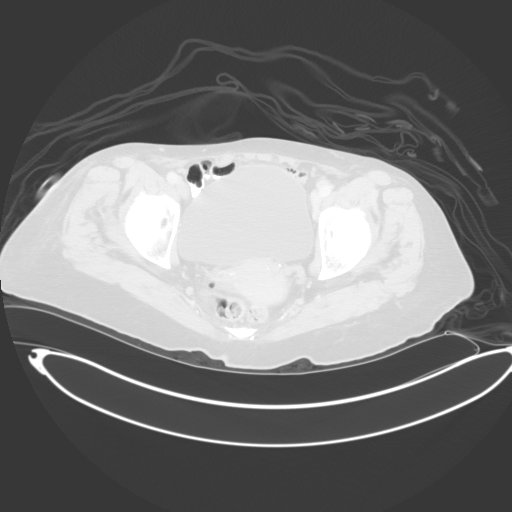
[im 35/127  lung]
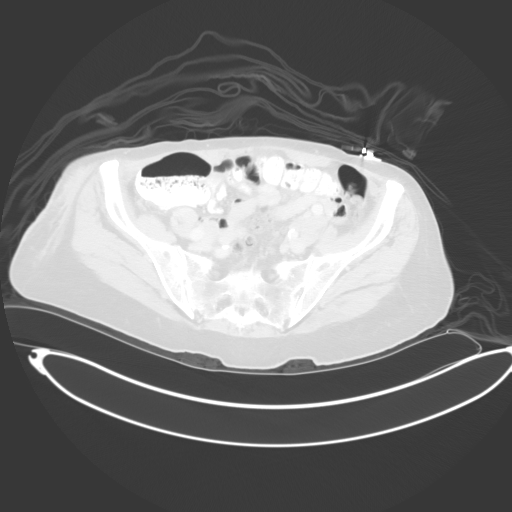
[im 46/127  lung]
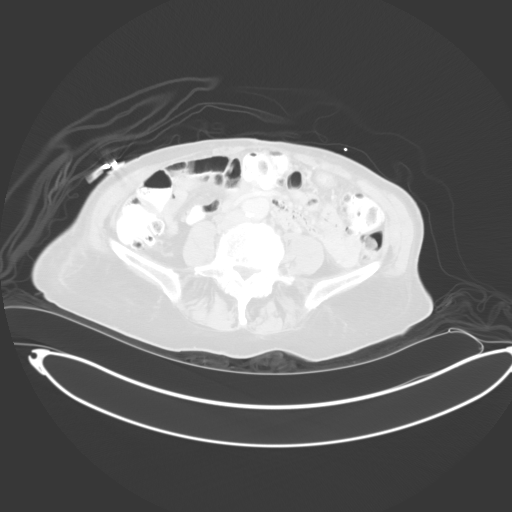
[im 58/127  mediastinal]
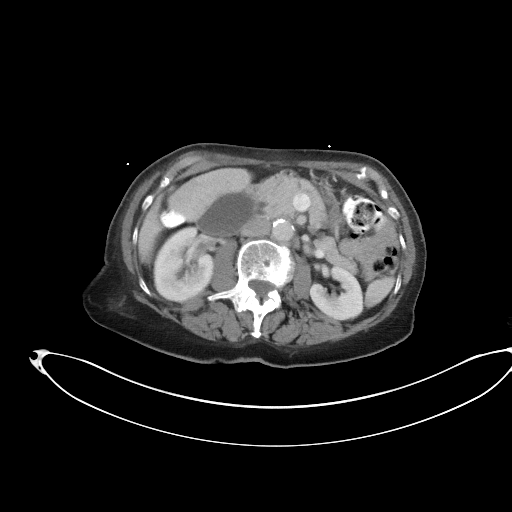
[im 58/127  lung]
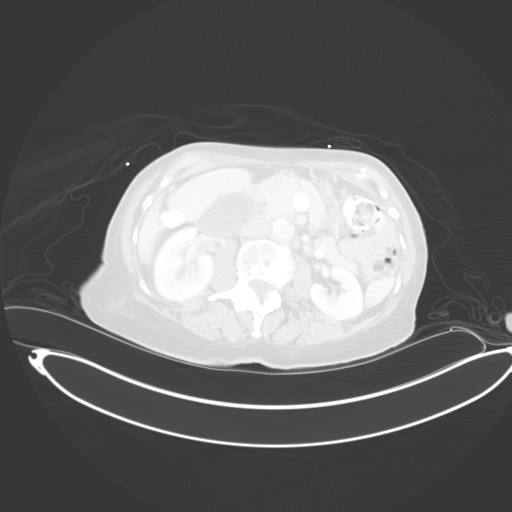
[im 69/127  lung]
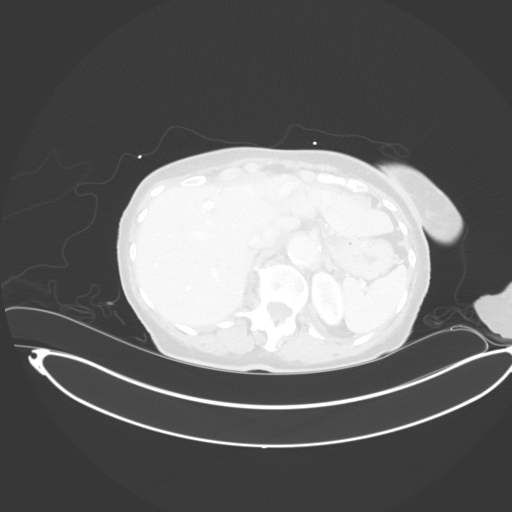
[im 81/127  lung]
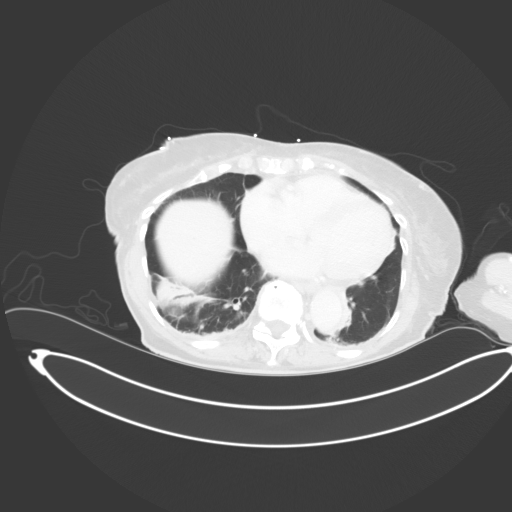
[im 92/127  lung]
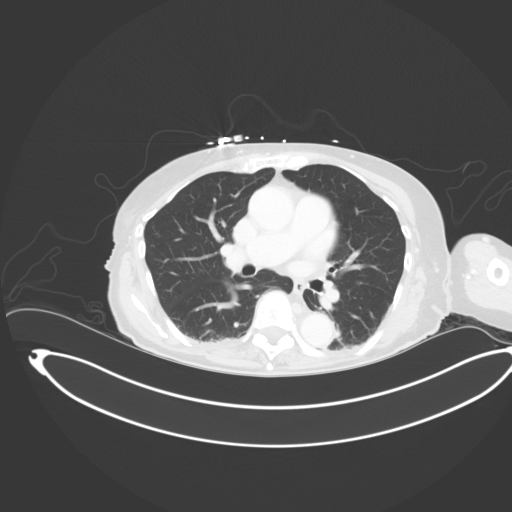
[im 104/127  mediastinal]
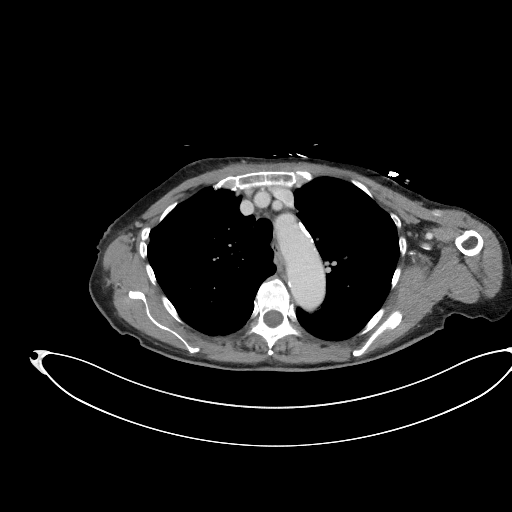
[im 104/127  lung]
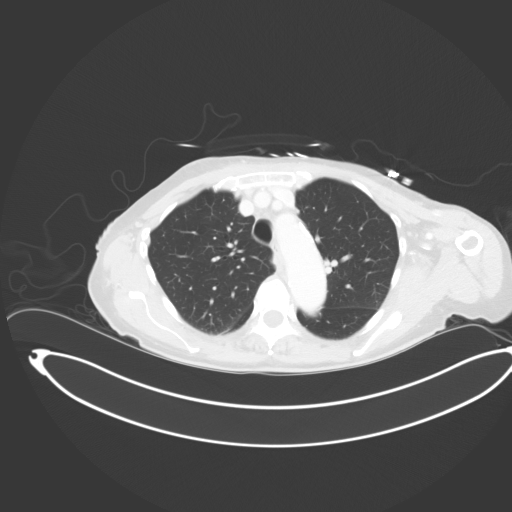
[im 115/127  lung]
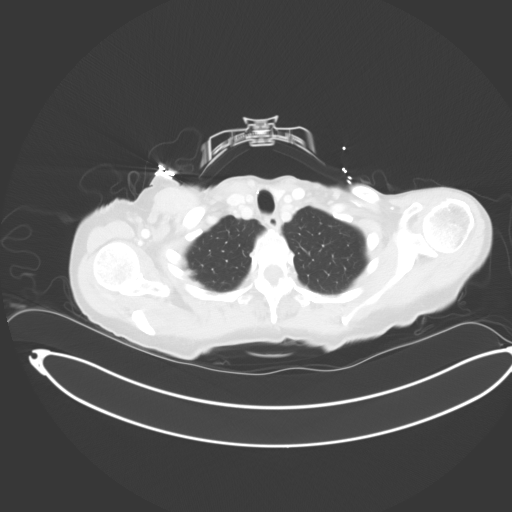

[Series 6: coronal · coronal · 0.77mm/px · 3 of 148 slices shown]
[im 30/148  lung]
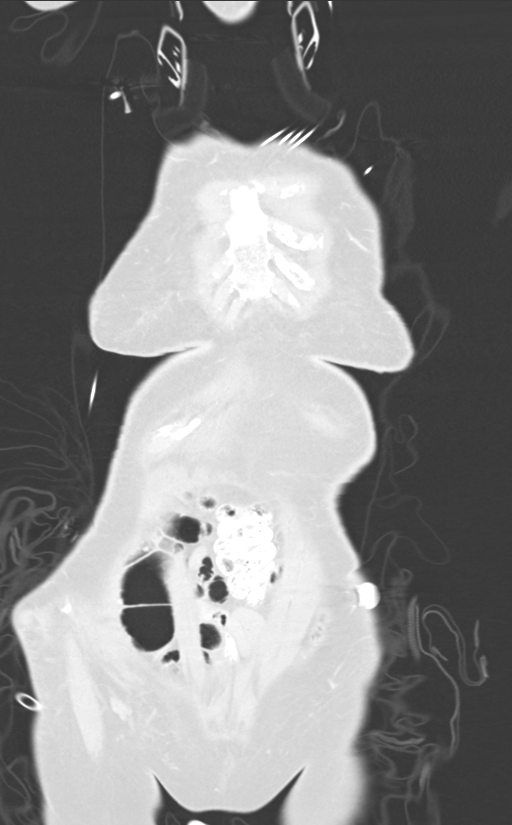
[im 59/148  lung]
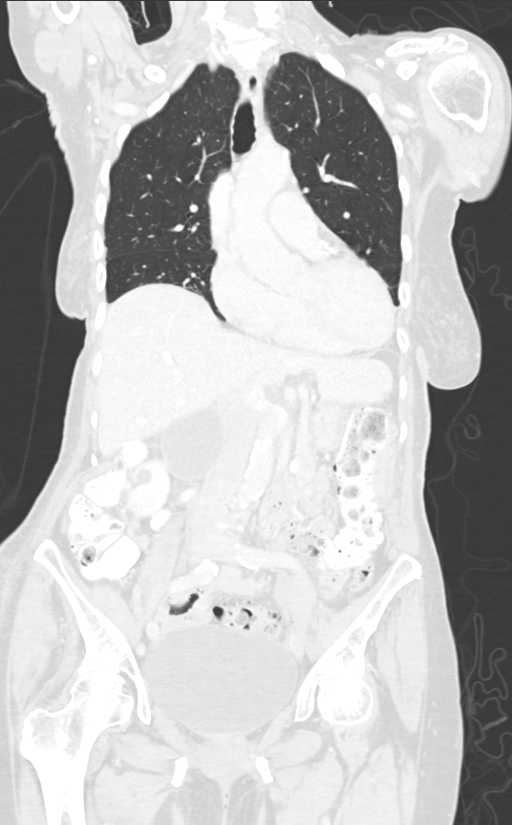
[im 89/148  lung]
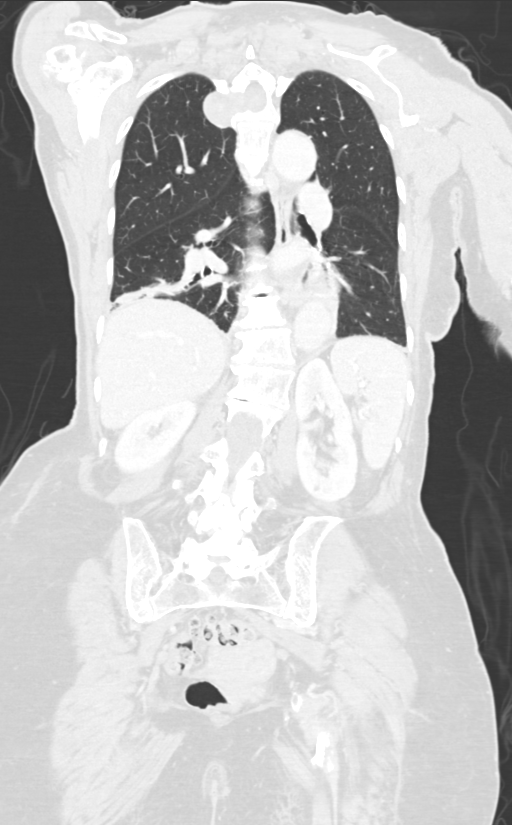

[13 of 36 positions shown; findings below may reference images not displayed]

FINDINGS: CT CHEST FINDINGS

Cardiovascular: Aortic atherosclerosis. Mild cardiomegaly. Left
coronary artery calcifications. No pericardial effusion.

Mediastinum/Nodes: No enlarged mediastinal, hilar, or axillary lymph
nodes. Status post right thyroidectomy. Trachea, and esophagus
demonstrate no significant findings.

Lungs/Pleura: Trace bilateral pleural effusions and associated
atelectasis or consolidation.

Musculoskeletal: There is a redemonstrated expansile mass arising
from the right T4 neural foramen measuring approximately 3.2 x 2.8 x
2.6 cm (series 3, image 17, series 6, image 92). There appears to be
some erosion of the right T4 pedicle and transverse process, however
there do appear to be some corticated margins about the vertebral
body anteriorly.

CT ABDOMEN PELVIS FINDINGS

Hepatobiliary: No solid liver abnormality is seen. No gallstones,
gallbladder wall thickening, or biliary dilatation.

Pancreas: Unremarkable. No pancreatic ductal dilatation or
surrounding inflammatory changes.

Spleen: Normal in size without significant abnormality.

Adrenals/Urinary Tract: Adrenal glands are unremarkable. Kidneys are
normal, without renal calculi, solid lesion, or hydronephrosis.
Bladder is unremarkable.

Stomach/Bowel: Stomach is within normal limits. Appendix appears
normal. No evidence of bowel wall thickening, distention, or
inflammatory changes. Sigmoid diverticulosis.

Vascular/Lymphatic: Aortic atherosclerosis. No enlarged abdominal or
pelvic lymph nodes.

Reproductive: No mass or other abnormality.

Other: No abdominal wall hernia or abnormality. No abdominopelvic
ascites.

Musculoskeletal: No acute or significant osseous findings.
IMPRESSION: 1. There is a redemonstrated expansile mass arising from the right
T4 neural foramen measuring approximately 3.2 x 2.8 x 2.6 cm. There
appears to be some erosion of the right T4 pedicle and transverse
process, however there do appear to be some corticated margins about
the vertebral body anteriorly. This finding is most consistent with
a peripheral nerve sheath tumor and generally better characterized
by MRI. Although nerve sheath tumors are most commonly benign,
erosive appearance is concerning for malignant transformation.
Metabolic activity can be characterized by PET-CT if desired with
consideration of tissue sampling.
2. No other evidence of malignancy in the chest, abdomen, or pelvis.
3. Trace bilateral pleural effusions and associated atelectasis or
consolidation.
4. Mild cardiomegaly and coronary artery disease.
5. Sigmoid diverticulosis without evidence of diverticulitis.
6. Status post right thyroidectomy.
7. Aortic Atherosclerosis ([id number]-HT8.8).

## 2021-04-29 ENCOUNTER — Other Ambulatory Visit: Payer: Self-pay | Admitting: Internal Medicine

## 2021-05-21 DIAGNOSIS — I5022 Chronic systolic (congestive) heart failure: Secondary | ICD-10-CM | POA: Diagnosis not present

## 2021-05-21 DIAGNOSIS — E039 Hypothyroidism, unspecified: Secondary | ICD-10-CM | POA: Diagnosis not present

## 2021-05-22 ENCOUNTER — Other Ambulatory Visit: Payer: Self-pay

## 2021-05-22 DIAGNOSIS — E039 Hypothyroidism, unspecified: Secondary | ICD-10-CM

## 2021-05-22 DIAGNOSIS — I5022 Chronic systolic (congestive) heart failure: Secondary | ICD-10-CM

## 2021-05-23 ENCOUNTER — Other Ambulatory Visit: Payer: Self-pay

## 2021-05-23 DIAGNOSIS — I5022 Chronic systolic (congestive) heart failure: Secondary | ICD-10-CM

## 2021-05-23 LAB — COMPLETE METABOLIC PANEL WITH GFR
AG Ratio: 1.1 (calc) (ref 1.0–2.5)
ALT: 17 U/L (ref 6–29)
AST: 22 U/L (ref 10–35)
Albumin: 3.8 g/dL (ref 3.6–5.1)
Alkaline phosphatase (APISO): 76 U/L (ref 37–153)
BUN/Creatinine Ratio: 16 (calc) (ref 6–22)
BUN: 19 mg/dL (ref 7–25)
CO2: 27 mmol/L (ref 20–32)
Calcium: 9.1 mg/dL (ref 8.6–10.4)
Chloride: 102 mmol/L (ref 98–110)
Creat: 1.16 mg/dL — ABNORMAL HIGH (ref 0.60–0.95)
Globulin: 3.5 g/dL (calc) (ref 1.9–3.7)
Glucose, Bld: 112 mg/dL — ABNORMAL HIGH (ref 65–99)
Potassium: 4 mmol/L (ref 3.5–5.3)
Sodium: 139 mmol/L (ref 135–146)
Total Bilirubin: 0.6 mg/dL (ref 0.2–1.2)
Total Protein: 7.3 g/dL (ref 6.1–8.1)
eGFR: 46 mL/min/{1.73_m2} — ABNORMAL LOW (ref >=60)

## 2021-05-23 LAB — CBC WITH DIFFERENTIAL/PLATELET
Absolute Monocytes: 1141 cells/uL — ABNORMAL HIGH (ref 200–950)
Basophils Absolute: 101 cells/uL (ref 0–200)
Basophils Relative: 1.1 %
Eosinophils Absolute: 552 cells/uL — ABNORMAL HIGH (ref 15–500)
Eosinophils Relative: 6 %
HCT: 43.5 % (ref 35.0–45.0)
Hemoglobin: 14.3 g/dL (ref 11.7–15.5)
Lymphs Abs: 2171 cells/uL (ref 850–3900)
MCH: 29.1 pg (ref 27.0–33.0)
MCHC: 32.9 g/dL (ref 32.0–36.0)
MCV: 88.6 fL (ref 80.0–100.0)
MPV: 11.8 fL (ref 7.5–12.5)
Monocytes Relative: 12.4 %
Neutro Abs: 5235 cells/uL (ref 1500–7800)
Neutrophils Relative %: 56.9 %
Platelets: 27 10*3/uL — ABNORMAL LOW (ref 140–400)
RBC: 4.91 10*6/uL (ref 3.80–5.10)
RDW: 13.2 % (ref 11.0–15.0)
Total Lymphocyte: 23.6 %
WBC: 9.2 10*3/uL (ref 3.8–10.8)

## 2021-05-23 LAB — LIPID PANEL
Cholesterol: 185 mg/dL (ref <200)
HDL: 78 mg/dL (ref >=50)
LDL Cholesterol (Calc): 91 mg/dL (calc)
Non-HDL Cholesterol (Calc): 107 mg/dL (calc) (ref <130)
Total CHOL/HDL Ratio: 2.4 (calc) (ref <5.0)
Triglycerides: 74 mg/dL (ref <150)

## 2021-05-23 LAB — TSH: TSH: 4.65 mIU/L — ABNORMAL HIGH (ref 0.40–4.50)

## 2021-05-28 ENCOUNTER — Encounter: Payer: Medicare Other | Admitting: Internal Medicine
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
## 2021-06-02 ENCOUNTER — Other Ambulatory Visit: Payer: Self-pay | Admitting: Orthopedic Surgery

## 2021-06-02 NOTE — Telephone Encounter (Signed)
Patient has request refill on medication "Oxycodone 5mg ". Patient last refill dated 04/07/2021. Patient has Opioid Treatment Agreement dated 12/17/2020. Patient medication pend and sent to Windell Moulding, NP for approval. Please Advise.

## 2021-06-03 ENCOUNTER — Encounter: Payer: Self-pay | Admitting: Physician Assistant

## 2021-06-03 ENCOUNTER — Other Ambulatory Visit: Payer: Self-pay

## 2021-06-03 ENCOUNTER — Ambulatory Visit (INDEPENDENT_AMBULATORY_CARE_PROVIDER_SITE_OTHER): Payer: Medicare Other | Admitting: Physician Assistant

## 2021-06-03 VITALS — BP 110/72 | HR 61 | Ht 66.0 in | Wt 142.8 lb

## 2021-06-03 DIAGNOSIS — I420 Dilated cardiomyopathy: Secondary | ICD-10-CM

## 2021-06-03 DIAGNOSIS — I10 Essential (primary) hypertension: Secondary | ICD-10-CM

## 2021-06-03 DIAGNOSIS — R609 Edema, unspecified: Secondary | ICD-10-CM | POA: Diagnosis not present

## 2021-06-03 DIAGNOSIS — I480 Paroxysmal atrial fibrillation: Secondary | ICD-10-CM | POA: Diagnosis not present

## 2021-06-03 NOTE — Patient Instructions (Signed)
Medication Instructions:   Your physician recommends that you continue on your current medications as directed. Please refer to the Current Medication list given to you today.  *If you need a refill on your cardiac medications before your next appointment, please call your pharmacy*   Lab Work: [place]   If you have labs (blood work) drawn today and your tests are completely normal, you will receive your results only by: [place] (if you have MyChart) OR A paper copy in the mail If you have any lab test that is abnormal or we need to change your treatment, we will call you to review the results.   Testing/Procedures: NONE ORDERED  TODAY    Follow-Up: At [facility], you and your health needs are our priority.  As part of our continuing mission to provide you with exceptional heart care, we have created designated Provider Care Teams.  These Care Teams include your primary Cardiologist (physician) and Advanced Practice Providers (APPs -  Physician Assistants and Nurse Practitioners) who all work together to provide you with the care you need, when you need it.  We recommend signing up for the patient portal called "MyChart".  Sign up information is provided on this After Visit Summary.  MyChart is used to connect with patients for Virtual Visits (Telemedicine).  Patients are able to view lab/test results, encounter notes, upcoming appointments, etc.  Non-urgent messages can be sent to your provider as well.   To learn more about what you can do with MyChart, go to [email or web address].    Your next appointment:   6 month(s)  The format for your next appointment:   In Person  Provider:   You may see Will Meredith Leeds, MD or one of the following Advanced Practice Providers on your designated Care Team:   Tommye Standard, [place] Legrand Como "Jonni Sanger" Chalmers Cater, [place]    Other Instructions

## 2021-06-09 ENCOUNTER — Other Ambulatory Visit: Payer: Self-pay | Admitting: Cardiology

## 2021-06-13 DIAGNOSIS — I5022 Chronic systolic (congestive) heart failure: Secondary | ICD-10-CM | POA: Diagnosis not present

## 2021-06-16 ENCOUNTER — Other Ambulatory Visit: Payer: Medicare Other

## 2021-06-16 ENCOUNTER — Other Ambulatory Visit: Payer: Self-pay

## 2021-06-16 DIAGNOSIS — I5022 Chronic systolic (congestive) heart failure: Secondary | ICD-10-CM

## 2021-06-16 LAB — CBC WITH DIFFERENTIAL/PLATELET
Absolute Monocytes: 1233 cells/uL — ABNORMAL HIGH (ref 200–950)
Basophils Absolute: 99 cells/uL (ref 0–200)
Basophils Relative: 1.1 %
Eosinophils Absolute: 459 cells/uL (ref 15–500)
Eosinophils Relative: 5.1 %
HCT: 42.9 % (ref 35.0–45.0)
Hemoglobin: 14.1 g/dL (ref 11.7–15.5)
Lymphs Abs: 2511 cells/uL (ref 850–3900)
MCH: 29.4 pg (ref 27.0–33.0)
MCHC: 32.9 g/dL (ref 32.0–36.0)
MCV: 89.6 fL (ref 80.0–100.0)
MPV: 11.7 fL (ref 7.5–12.5)
Monocytes Relative: 13.7 %
Neutro Abs: 4698 cells/uL (ref 1500–7800)
Neutrophils Relative %: 52.2 %
RBC: 4.79 10*6/uL (ref 3.80–5.10)
RDW: 13.6 % (ref 11.0–15.0)
Total Lymphocyte: 27.9 %
WBC: 9 10*3/uL (ref 3.8–10.8)

## 2021-06-18 ENCOUNTER — Encounter: Payer: Self-pay | Admitting: Internal Medicine

## 2021-06-18 ENCOUNTER — Non-Acute Institutional Stay: Payer: Medicare Other | Admitting: Internal Medicine

## 2021-06-18 ENCOUNTER — Other Ambulatory Visit: Payer: Self-pay

## 2021-06-18 VITALS — BP 97/58 | HR 51 | Temp 97.7°F | Ht 66.0 in | Wt 140.0 lb

## 2021-06-18 DIAGNOSIS — N1831 Chronic kidney disease, stage 3a: Secondary | ICD-10-CM | POA: Diagnosis not present

## 2021-06-18 DIAGNOSIS — E039 Hypothyroidism, unspecified: Secondary | ICD-10-CM | POA: Diagnosis not present

## 2021-06-18 DIAGNOSIS — I959 Hypotension, unspecified: Secondary | ICD-10-CM | POA: Diagnosis not present

## 2021-06-18 DIAGNOSIS — M7989 Other specified soft tissue disorders: Secondary | ICD-10-CM | POA: Diagnosis not present

## 2021-06-18 DIAGNOSIS — I5022 Chronic systolic (congestive) heart failure: Secondary | ICD-10-CM | POA: Diagnosis not present

## 2021-06-18 DIAGNOSIS — H9193 Unspecified hearing loss, bilateral: Secondary | ICD-10-CM | POA: Diagnosis not present

## 2021-06-18 DIAGNOSIS — R5383 Other fatigue: Secondary | ICD-10-CM | POA: Diagnosis not present

## 2021-06-18 DIAGNOSIS — I480 Paroxysmal atrial fibrillation: Secondary | ICD-10-CM | POA: Diagnosis not present

## 2021-06-18 NOTE — Patient Instructions (Signed)
Check your BP with Nurse on Tues. It is slightly on the lower side. Let us know if you feel any dizziness.
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
## 2021-07-10 ENCOUNTER — Telehealth: Payer: Self-pay

## 2021-07-10 DIAGNOSIS — H9193 Unspecified hearing loss, bilateral: Secondary | ICD-10-CM

## 2021-07-10 NOTE — Telephone Encounter (Signed)
Incoming call received from patient requesting referral to [facility] for a hearing test.

## 2021-07-10 NOTE — Telephone Encounter (Signed)
Order was signed by Dr.Gupta

## 2021-07-14 ENCOUNTER — Telehealth: Payer: Self-pay | Admitting: Internal Medicine

## 2021-07-14 NOTE — Telephone Encounter (Signed)
Laura Lee called requesting referral to [facility]. She has an audiology appt 07/30/21 & is considering hearing aids.  Thank you, Vilinda Blanks

## 2021-07-21 ENCOUNTER — Other Ambulatory Visit: Payer: Self-pay | Admitting: Orthopedic Surgery

## 2021-07-21 NOTE — Telephone Encounter (Signed)
RX last refilled on 06/02/2021, treatment agreement on file from 70/2022

## 2021-07-22 DIAGNOSIS — I480 Paroxysmal atrial fibrillation: Secondary | ICD-10-CM | POA: Diagnosis not present

## 2021-07-22 DIAGNOSIS — M6281 Muscle weakness (generalized): Secondary | ICD-10-CM | POA: Diagnosis not present

## 2021-07-23 DIAGNOSIS — M6281 Muscle weakness (generalized): Secondary | ICD-10-CM | POA: Diagnosis not present

## 2021-07-23 DIAGNOSIS — I480 Paroxysmal atrial fibrillation: Secondary | ICD-10-CM | POA: Diagnosis not present

## 2021-07-24 DIAGNOSIS — I872 Venous insufficiency (chronic) (peripheral): Secondary | ICD-10-CM | POA: Diagnosis not present

## 2021-07-24 DIAGNOSIS — L821 Other seborrheic keratosis: Secondary | ICD-10-CM | POA: Diagnosis not present

## 2021-07-24 DIAGNOSIS — L814 Other melanin hyperpigmentation: Secondary | ICD-10-CM | POA: Diagnosis not present

## 2021-07-24 DIAGNOSIS — L570 Actinic keratosis: Secondary | ICD-10-CM | POA: Diagnosis not present

## 2021-07-25 DIAGNOSIS — I480 Paroxysmal atrial fibrillation: Secondary | ICD-10-CM | POA: Diagnosis not present

## 2021-07-25 DIAGNOSIS — M6281 Muscle weakness (generalized): Secondary | ICD-10-CM | POA: Diagnosis not present

## 2021-07-29 DIAGNOSIS — I480 Paroxysmal atrial fibrillation: Secondary | ICD-10-CM | POA: Diagnosis not present

## 2021-07-29 DIAGNOSIS — M6281 Muscle weakness (generalized): Secondary | ICD-10-CM | POA: Diagnosis not present

## 2021-07-30 DIAGNOSIS — H903 Sensorineural hearing loss, bilateral: Secondary | ICD-10-CM | POA: Diagnosis not present

## 2021-08-01 DIAGNOSIS — I480 Paroxysmal atrial fibrillation: Secondary | ICD-10-CM | POA: Diagnosis not present

## 2021-08-01 DIAGNOSIS — M6281 Muscle weakness (generalized): Secondary | ICD-10-CM | POA: Diagnosis not present

## 2021-08-05 DIAGNOSIS — I480 Paroxysmal atrial fibrillation: Secondary | ICD-10-CM | POA: Diagnosis not present

## 2021-08-05 DIAGNOSIS — M6281 Muscle weakness (generalized): Secondary | ICD-10-CM | POA: Diagnosis not present

## 2021-08-08 DIAGNOSIS — M6281 Muscle weakness (generalized): Secondary | ICD-10-CM | POA: Diagnosis not present

## 2021-08-08 DIAGNOSIS — I480 Paroxysmal atrial fibrillation: Secondary | ICD-10-CM | POA: Diagnosis not present

## 2021-08-11 ENCOUNTER — Other Ambulatory Visit: Payer: Self-pay | Admitting: Student

## 2021-08-13 DIAGNOSIS — I480 Paroxysmal atrial fibrillation: Secondary | ICD-10-CM | POA: Diagnosis not present

## 2021-08-13 DIAGNOSIS — M6281 Muscle weakness (generalized): Secondary | ICD-10-CM | POA: Diagnosis not present

## 2021-08-14 ENCOUNTER — Telehealth: Payer: Self-pay

## 2021-08-14 NOTE — Telephone Encounter (Signed)
Left message on patients voicemail to call back regarding scheduling her for her Medicare AWV in clinic at [facility] with Windell Moulding, NP.  ?

## 2021-08-15 DIAGNOSIS — I480 Paroxysmal atrial fibrillation: Secondary | ICD-10-CM | POA: Diagnosis not present

## 2021-08-15 DIAGNOSIS — M6281 Muscle weakness (generalized): Secondary | ICD-10-CM | POA: Diagnosis not present

## 2021-08-19 DIAGNOSIS — I480 Paroxysmal atrial fibrillation: Secondary | ICD-10-CM | POA: Diagnosis not present

## 2021-08-19 DIAGNOSIS — M6281 Muscle weakness (generalized): Secondary | ICD-10-CM | POA: Diagnosis not present

## 2021-08-22 DIAGNOSIS — I480 Paroxysmal atrial fibrillation: Secondary | ICD-10-CM | POA: Diagnosis not present

## 2021-08-22 DIAGNOSIS — M6281 Muscle weakness (generalized): Secondary | ICD-10-CM | POA: Diagnosis not present

## 2021-08-24 DIAGNOSIS — I480 Paroxysmal atrial fibrillation: Secondary | ICD-10-CM | POA: Diagnosis not present

## 2021-08-24 DIAGNOSIS — M6281 Muscle weakness (generalized): Secondary | ICD-10-CM | POA: Diagnosis not present

## 2021-08-26 DIAGNOSIS — M6281 Muscle weakness (generalized): Secondary | ICD-10-CM | POA: Diagnosis not present

## 2021-08-26 DIAGNOSIS — I480 Paroxysmal atrial fibrillation: Secondary | ICD-10-CM | POA: Diagnosis not present

## 2021-09-05 DIAGNOSIS — M6281 Muscle weakness (generalized): Secondary | ICD-10-CM | POA: Diagnosis not present

## 2021-09-05 DIAGNOSIS — I480 Paroxysmal atrial fibrillation: Secondary | ICD-10-CM | POA: Diagnosis not present

## 2021-09-09 DIAGNOSIS — I480 Paroxysmal atrial fibrillation: Secondary | ICD-10-CM | POA: Diagnosis not present

## 2021-09-09 DIAGNOSIS — M6281 Muscle weakness (generalized): Secondary | ICD-10-CM | POA: Diagnosis not present

## 2021-09-12 DIAGNOSIS — M6281 Muscle weakness (generalized): Secondary | ICD-10-CM | POA: Diagnosis not present

## 2021-09-12 DIAGNOSIS — I480 Paroxysmal atrial fibrillation: Secondary | ICD-10-CM | POA: Diagnosis not present

## 2021-09-16 DIAGNOSIS — I480 Paroxysmal atrial fibrillation: Secondary | ICD-10-CM | POA: Diagnosis not present

## 2021-09-16 DIAGNOSIS — M6281 Muscle weakness (generalized): Secondary | ICD-10-CM | POA: Diagnosis not present

## 2021-09-17 ENCOUNTER — Encounter: Payer: Medicare Other | Admitting: Internal Medicine

## 2021-09-17 ENCOUNTER — Non-Acute Institutional Stay: Payer: Medicare Other | Admitting: Internal Medicine

## 2021-09-17 VITALS — BP 141/76 | HR 57 | Temp 97.6°F | Ht 66.0 in | Wt 141.8 lb

## 2021-09-17 DIAGNOSIS — H9193 Unspecified hearing loss, bilateral: Secondary | ICD-10-CM | POA: Diagnosis not present

## 2021-09-17 DIAGNOSIS — I5022 Chronic systolic (congestive) heart failure: Secondary | ICD-10-CM

## 2021-09-17 DIAGNOSIS — N1831 Chronic kidney disease, stage 3a: Secondary | ICD-10-CM

## 2021-09-17 DIAGNOSIS — D492 Neoplasm of unspecified behavior of bone, soft tissue, and skin: Secondary | ICD-10-CM

## 2021-09-17 DIAGNOSIS — E039 Hypothyroidism, unspecified: Secondary | ICD-10-CM | POA: Diagnosis not present

## 2021-09-17 DIAGNOSIS — M7989 Other specified soft tissue disorders: Secondary | ICD-10-CM | POA: Diagnosis not present

## 2021-09-19 DIAGNOSIS — M6281 Muscle weakness (generalized): Secondary | ICD-10-CM | POA: Diagnosis not present

## 2021-09-19 DIAGNOSIS — I480 Paroxysmal atrial fibrillation: Secondary | ICD-10-CM | POA: Diagnosis not present
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
## 2021-09-22 ENCOUNTER — Encounter: Payer: Self-pay | Admitting: Orthopedic Surgery

## 2021-09-22 ENCOUNTER — Non-Acute Institutional Stay: Payer: Medicare Other | Admitting: Orthopedic Surgery

## 2021-09-22 VITALS — BP 127/71 | HR 76 | Temp 97.5°F | Resp 16 | Ht 66.0 in | Wt 142.2 lb

## 2021-09-22 DIAGNOSIS — Z0000 Encounter for general adult medical examination without abnormal findings: Secondary | ICD-10-CM

## 2021-09-22 NOTE — Progress Notes (Signed)
? ?Subjective:  ? Laura Lee is a 88 y.o. female who presents for Medicare Annual (Subsequent) preventive examination. ? ?Place of Service: [facility] ?Provider: Windell Moulding, AGNP-C  ? ?Review of Systems    ? ?Cardiac Risk Factors include: hypertension ? ?   ?Objective:  ?  ?Today's Vitals  ? 09/22/21 1415  ?BP: 127/71  ?Pulse: 76  ?Resp: 16  ?Temp: (!) 97.5 ?F (36.4 ?C)  ?SpO2: 95%  ?Weight: 142 lb 3.2 oz (64.5 kg)  ?Height: '5\' 6"'$  (1.676 m)  ? ?Body mass index is 22.95 kg/m?. ? ? ?  09/22/2021  ?  2:25 PM 01/01/2021  ? 11:53 AM 12/24/2020  ?  1:41 PM 12/11/2020  ?  3:33 PM 11/13/2020  ?  3:42 PM 09/10/2020  ?  9:53 AM 05/02/2020  ?  7:12 AM  ?Advanced Directives  ?Does Patient Have a Medical Advance Directive? Yes Yes No Yes Yes Yes Yes  ?Type of Paramedic of [place];Living will;Out of facility DNR (pink MOST or yellow form) Out of facility DNR (pink MOST or yellow form);Living will;Healthcare Power of [place];Living will [place];Living will [place];Living will [place];Living will  ?Does patient want to make changes to medical advance directive? No - Patient declined No - Patient declined No - Patient declined No - Patient declined No - Patient declined    ?Copy of [place] in Chart? Yes - validated most recent copy scanned in chart (See row information) Yes - validated most recent copy scanned in chart (See row information)    No - copy requested Yes - validated most recent copy scanned in chart (See row information)  ?Pre-existing out of facility DNR order (yellow form or pink MOST form)  Yellow form placed in chart (order not valid for inpatient use)       ? ? ?Current Medications (verified) ?Outpatient Encounter Medications as of 09/22/2021  ?Medication Sig  ? amiodarone (PACERONE) 200 MG tablet Take 1 tablet (200 mg total) by mouth daily.  ? apixaban (ELIQUIS) 5 MG  TABS tablet TAKE 1 TABLET BY MOUTH TWICE DAILY.  ? beta carotene w/minerals (OCUVITE) tablet Take 1 tablet by mouth at bedtime.  ? Calcium Citrate-Vitamin D (CALCIUM CITRATE + D3 PO) Take 1 tablet by mouth 2 (two) times daily.  ? carboxymethylcellulose (REFRESH PLUS) 0.5 % SOLN Place 1 drop into both eyes 3 (three) times daily as needed (dry/irritated eyes.).   ? cholecalciferol (VITAMIN D) 25 MCG (1000 UNIT) tablet Take 1,000 Units by mouth in the morning. chewables  ? diltiazem (CARDIZEM CD) 300 MG 24 hr capsule TAKE ONE CAPSULE BY MOUTH DAILY  ? Emollient (AQUAPHOR ADVANCED THERAPY) OINT Apply 1 applicator topically 2 (two) times daily.  ? furosemide (LASIX) 20 MG tablet TAKE TWO TABLETS BY MOUTH DAILY  ? glucosamine-chondroitin 500-400 MG tablet Take 1 tablet by mouth in the morning and at bedtime.  ? latanoprost (XALATAN) 0.005 % ophthalmic solution Place 1 drop into both eyes at bedtime.   ? levothyroxine (SYNTHROID) 100 MCG tablet Take 1 tablet (100 mcg total) by mouth daily before breakfast.  ? Multiple Vitamin (MULTIVITAMIN WITH MINERALS) TABS tablet Take 1 tablet by mouth daily with lunch. Adults 50+  ? oxyCODONE (OXY IR/ROXICODONE) 5 MG immediate release tablet TAKE ONE TABLET BY MOUTH IN THE MORNING AND TAKE ONE TABLET AT BEDTIME  ? Polyethylene Glycol 400 (BLINK TEARS) 0.25 %  SOLN Place 1-2 drops into both eyes 3 (three) times daily as needed (dry/irritated eyes).  ? potassium chloride (KLOR-CON) 10 MEQ tablet TAKE TWO TABLETS BY MOUTH DAILY  ? sacubitril-valsartan (ENTRESTO) 24-26 MG TAKE ONE TABLET TWICE DAILY  ? triamcinolone ointment (KENALOG) 0.1 % Apply 1 application topically daily.  ? ?No facility-administered encounter medications on file as of 09/22/2021.  ? ? ?Allergies (verified) ?Combigan [brimonidine tartrate-timolol] and Pneumococcal vaccines  ? ?History: ?Past Medical History:  ?Diagnosis Date  ? Atrial fibrillation ([place])   ? Breast CA ([place])   ? Cancer [facility])   ? Breast and thyroid cancer   ? Glaucoma   ? Hearing impaired   ? Hearing aids  ? Hypertension   ? Osteopenia 05/2013  ? T score -1.9 FRAX 15%/4.3%  ? Thyroid disease   ? Cancer  ? ?Past Surgical History:  ?Procedure Laterality Date  ? APPENDECTOMY    ? BREAST EXCISIONAL BIOPSY Right   ? benign  ? BREAST LUMPECTOMY    ? [phone] radiation  ? BREAST SURGERY  1989  ? Lumpectomy right breast  ? CARDIOVERSION N/A 02/14/2016  ? Procedure: CARDIOVERSION;  Surgeon: Sueanne Margarita, MD;  Location: [place];  Service: Cardiovascular;  Laterality: N/A;  ? CARDIOVERSION N/A 08/04/2018  ? Procedure: CARDIOVERSION;  Surgeon: Buford Dresser, MD;  Location: [facility] ENDOSCOPY;  Service: Cardiovascular;  Laterality: N/A;  ? CARDIOVERSION N/A 10/31/2019  ? Procedure: CARDIOVERSION;  Surgeon: Dorothy Spark, MD;  Location: [place];  Service: Cardiovascular;  Laterality: N/A;  ? CARDIOVERSION N/A 05/02/2020  ? Procedure: CARDIOVERSION;  Surgeon: Elouise Munroe, MD;  Location: [place];  Service: Cardiovascular;  Laterality: N/A;  ? CARDIOVERSION N/A 09/10/2020  ? Procedure: CARDIOVERSION;  Surgeon: Werner Lean, MD;  Location: [facility] ENDOSCOPY;  Service: Cardiovascular;  Laterality: N/A;  ? Mole excised    ? Benign  ? THYROID SURGERY    ? Removed 1 lobe  ? TONSILLECTOMY    ? ?Family History  ?Problem Relation Age of Onset  ? Breast cancer Mother 10  ? Lung cancer Sister   ? Heart attack Brother   ? Breast cancer Daughter 43  ? ?Social History  ? ?Socioeconomic History  ? Marital status: Widowed  ?  Spouse name: Not on file  ? Number of children: 2  ? Years of education: Not on file  ? Highest education level: Doctorate  ?Occupational History  ? Occupation: retired  ?Tobacco Use  ? Smoking status: Never  ? Smokeless tobacco: Never  ?Vaping Use  ? Vaping Use: Never used  ?Substance and Sexual Activity  ? Alcohol use: Yes  ?  Comment: Rare  ? Drug use: No  ? Sexual activity: Never  ?  Comment: 1st intercourse 78 yo-1 partner  ?Other Topics  Concern  ? Not on file  ?Social History Narrative  ? Not on file  ? ?Social Determinants of Health  ? ?Financial Resource Strain: Low Risk   ? Difficulty of Paying Living Expenses: Not hard at all  ?Food Insecurity: No Food Insecurity  ? Worried About Charity fundraiser in the Last Year: Never true  ? Ran Out of Food in the Last Year: Never true  ?Transportation Needs: No Transportation Needs  ? Lack of Transportation (Medical): No  ? Lack of Transportation (Non-Medical): No  ?Physical Activity: Sufficiently Active  ? Days of Exercise per Week: 3 days  ? Minutes of Exercise per Session: 50 min  ?Stress: No Stress Concern Present  ?  Feeling of Stress : Only a little  ?Social Connections: Moderately Isolated  ? Frequency of Communication with Friends and Family: Twice a week  ? Frequency of Social Gatherings with Friends and Family: Once a week  ? Attends Religious Services: 1 to 4 times per year  ? Active Member of Clubs or Organizations: No  ? Attends Archivist Meetings: Never  ? Marital Status: Widowed  ? ? ?Tobacco Counseling ?Counseling given: Not Answered ? ? ?Clinical Intake: ? ?Pre-visit preparation completed: No ? ?Pain : No/denies pain ? ?  ? ?BMI - recorded: 22.89 ?Nutritional Status: BMI of 19-24  Normal ?Diabetes: No ? ?How often do you need to have someone help you when you read instructions, pamphlets, or other written materials from your doctor or pharmacy?: 2 - Rarely ?What is the last grade level you completed in school?: [employer] degree ? ?Diabetic?No ? ?  ? ?  ? ? ?Activities of Daily Living ? ?  09/22/2021  ?  2:44 PM  ?In your present state of health, do you have any difficulty performing the following activities:  ?Hearing? 1  ?Vision? 0  ?Difficulty concentrating or making decisions? 0  ?Walking or climbing stairs? 1  ?Dressing or bathing? 0  ?Doing errands, shopping? 0  ?Preparing Food and eating ? N  ?Using the Toilet? N  ?In the past six months, have you accidently leaked urine?  Y  ?Do you have problems with loss of bowel control? N  ?Managing your Medications? N  ?Managing your Finances? N  ?Housekeeping or managing your Housekeeping? N  ? ? ?Patient Care Team: ?Oneita Jolly

## 2021-09-22 NOTE — Patient Instructions (Signed)
?  Ms. Laura Lee , ?Thank you for taking time to come for your Medicare Wellness Visit. I appreciate your ongoing commitment to your health goals. Please review the following plan we discussed and let me know if I can assist you in the future.  ? ?These are the goals we discussed: ? Goals   ? ?  Maintain Mobility and Function   ?  Evidence-based guidance:  ?Emphasize the importance of physical activity and aerobic exercise as included in treatment plan; assess barriers to adherence; consider patient's abilities and preferences.  ?Encourage gradual increase in activity or exercise instead of stopping if pain occurs.  ?Reinforce individual therapy exercise prescription, such as strengthening, stabilization and stretching programs.  ?Promote optimal body mechanics to stabilize the spine with lifting and functional activity.  ?Encourage activity and mobility modifications to facilitate optimal function, such as using a log roll for bed mobility or dressing from a seated position.  ?Reinforce individual adaptive equipment recommendations to limit excessive spinal movements, such as a Systems analyst.  ?Assess adequacy of sleep; encourage use of sleep hygiene techniques, such as bedtime routine; use of white noise; dark, cool bedroom; avoiding daytime naps, heavy meals or exercise before bedtime.  ?Promote positions and modification to optimize sleep and sexual activity; consider pillows or positioning devices to assist in maintaining neutral spine.  ?Explore options for applying ergonomic principles at work and home, such as frequent position changes, using ergonomically designed equipment and working at optimal height.  ?Promote modifications to increase comfort with driving such as lumbar support, optimizing seat and steering wheel position, using cruise control and taking frequent rest stops to stretch and walk.   ?Notes:  ?  ? ?  ?  ?This is a list of the screening recommended for you and due dates:  ?Health  Maintenance  ?Topic Date Due  ? Zoster (Shingles) Vaccine (1 of 2) Never done  ? COVID-19 Vaccine (5 - Booster) 04/22/2022*  ? Flu Shot  12/23/2021  ? DEXA scan (bone density measurement)  Completed  ? HPV Vaccine  Aged Out  ? Pneumonia Vaccine  Discontinued  ? Tetanus Vaccine  Discontinued  ?*Topic was postponed. The date shown is not the original due date.  ? ?Please call our office if you have not been contacted to scheduled MRI or Bone Density [phone] ?

## 2021-09-23 ENCOUNTER — Ambulatory Visit
Admission: RE | Admit: 2021-09-23 | Discharge: 2021-09-23 | Disposition: A | Discharge status: Home / Self Care | Payer: Medicare Other | Source: Ambulatory Visit | Attending: Internal Medicine | Admitting: Internal Medicine

## 2021-09-23 DIAGNOSIS — I480 Paroxysmal atrial fibrillation: Secondary | ICD-10-CM | POA: Diagnosis not present

## 2021-09-23 DIAGNOSIS — M6281 Muscle weakness (generalized): Secondary | ICD-10-CM | POA: Diagnosis not present

## 2021-09-23 DIAGNOSIS — D492 Neoplasm of unspecified behavior of bone, soft tissue, and skin: Secondary | ICD-10-CM

## 2021-09-26 DIAGNOSIS — M6281 Muscle weakness (generalized): Secondary | ICD-10-CM | POA: Diagnosis not present

## 2021-09-26 DIAGNOSIS — I480 Paroxysmal atrial fibrillation: Secondary | ICD-10-CM | POA: Diagnosis not present

## 2021-09-29 ENCOUNTER — Other Ambulatory Visit: Payer: Self-pay | Admitting: Orthopedic Surgery

## 2021-09-29 NOTE — Telephone Encounter (Signed)
Patient has request refill on medication Oxycodone. Patient last refill on medication dated 07/21/2021. Patient has Opioid Contract on file dated 12/17/2020. Patient has upcoming appointment dated 10/29/2021. Update Contract added to patient appointment noted. Medication pend and sent to Windell Moulding, NP for approval.  ?

## 2021-09-30 DIAGNOSIS — M6281 Muscle weakness (generalized): Secondary | ICD-10-CM | POA: Diagnosis not present

## 2021-09-30 DIAGNOSIS — I480 Paroxysmal atrial fibrillation: Secondary | ICD-10-CM | POA: Diagnosis not present

## 2021-10-03 DIAGNOSIS — I480 Paroxysmal atrial fibrillation: Secondary | ICD-10-CM | POA: Diagnosis not present

## 2021-10-03 DIAGNOSIS — M6281 Muscle weakness (generalized): Secondary | ICD-10-CM | POA: Diagnosis not present

## 2021-10-06 ENCOUNTER — Other Ambulatory Visit: Payer: Self-pay | Admitting: Cardiology

## 2021-10-06 DIAGNOSIS — I4819 Other persistent atrial fibrillation: Secondary | ICD-10-CM

## 2021-10-06 NOTE — Telephone Encounter (Signed)
Eliquis '5mg'$  refill request received. Patient is 88 years old, weight-64.5kg, Crea-1.16 on 05/21/2021, Diagnosis-Afib, and last seen by Tommye Standard on 06/03/2021. Dose is appropriate based on dosing criteria. Will send in refill to requested pharmacy.   ?

## 2021-10-07 DIAGNOSIS — M6281 Muscle weakness (generalized): Secondary | ICD-10-CM | POA: Diagnosis not present

## 2021-10-07 DIAGNOSIS — I480 Paroxysmal atrial fibrillation: Secondary | ICD-10-CM | POA: Diagnosis not present

## 2021-10-10 DIAGNOSIS — M6281 Muscle weakness (generalized): Secondary | ICD-10-CM | POA: Diagnosis not present

## 2021-10-10 DIAGNOSIS — I480 Paroxysmal atrial fibrillation: Secondary | ICD-10-CM | POA: Diagnosis not present

## 2021-10-14 ENCOUNTER — Other Ambulatory Visit: Payer: Self-pay

## 2021-10-14 DIAGNOSIS — M6281 Muscle weakness (generalized): Secondary | ICD-10-CM | POA: Diagnosis not present

## 2021-10-14 DIAGNOSIS — I4819 Other persistent atrial fibrillation: Secondary | ICD-10-CM

## 2021-10-14 DIAGNOSIS — I480 Paroxysmal atrial fibrillation: Secondary | ICD-10-CM | POA: Diagnosis not present

## 2021-10-14 MED ORDER — ENTRESTO 24-26 MG PO TABS: 1.0000 | ORAL_TABLET | Freq: Two times a day (BID) | ORAL | 2 refills | Status: DC

## 2021-10-14 MED ORDER — APIXABAN 5 MG PO TABS: 5.0000 mg | ORAL_TABLET | Freq: Two times a day (BID) | ORAL | 1 refills | Status: DC

## 2021-10-14 NOTE — Telephone Encounter (Signed)
[facility] mail order pharmacy requesting the pt's medication Eliquis be sent to their pharmacy. Please address

## 2021-10-14 NOTE — Telephone Encounter (Signed)
Eliquis '5mg'$  refill request received for mail order. Patient is 88 years old, weight-64.5kg, Crea-1.16 on 05/21/2021, Diagnosis-Afib, and last seen by Tommye Standard on 06/03/2021. Dose is appropriate based on dosing criteria. Will send in refill to requested pharmacy.

## 2021-10-16 DIAGNOSIS — I480 Paroxysmal atrial fibrillation: Secondary | ICD-10-CM | POA: Diagnosis not present

## 2021-10-16 DIAGNOSIS — M6281 Muscle weakness (generalized): Secondary | ICD-10-CM | POA: Diagnosis not present

## 2021-10-29 ENCOUNTER — Non-Acute Institutional Stay: Payer: Medicare Other | Admitting: Internal Medicine

## 2021-10-29 VITALS — BP 118/67 | HR 58 | Temp 97.7°F | Ht 66.0 in | Wt 142.7 lb

## 2021-10-29 DIAGNOSIS — E039 Hypothyroidism, unspecified: Secondary | ICD-10-CM

## 2021-10-29 DIAGNOSIS — R5383 Other fatigue: Secondary | ICD-10-CM

## 2021-10-29 DIAGNOSIS — I5022 Chronic systolic (congestive) heart failure: Secondary | ICD-10-CM

## 2021-10-29 DIAGNOSIS — D492 Neoplasm of unspecified behavior of bone, soft tissue, and skin: Secondary | ICD-10-CM | POA: Diagnosis not present

## 2021-10-29 DIAGNOSIS — M7989 Other specified soft tissue disorders: Secondary | ICD-10-CM | POA: Diagnosis not present

## 2021-10-29 DIAGNOSIS — N1831 Chronic kidney disease, stage 3a: Secondary | ICD-10-CM | POA: Diagnosis not present

## 2021-10-29 DIAGNOSIS — I480 Paroxysmal atrial fibrillation: Secondary | ICD-10-CM

## 2021-10-29 DIAGNOSIS — H9193 Unspecified hearing loss, bilateral: Secondary | ICD-10-CM
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
## 2021-11-05 DIAGNOSIS — Z23 Encounter for immunization: Secondary | ICD-10-CM | POA: Diagnosis not present

## 2021-11-17 ENCOUNTER — Other Ambulatory Visit: Payer: Self-pay | Admitting: Orthopedic Surgery

## 2021-12-09 ENCOUNTER — Other Ambulatory Visit: Payer: Self-pay | Admitting: Internal Medicine

## 2021-12-22 ENCOUNTER — Other Ambulatory Visit: Payer: Self-pay | Admitting: Physician Assistant

## 2021-12-22 ENCOUNTER — Other Ambulatory Visit: Payer: Self-pay | Admitting: Internal Medicine

## 2022-01-19 ENCOUNTER — Other Ambulatory Visit: Payer: Self-pay | Admitting: Internal Medicine

## 2022-01-19 NOTE — Telephone Encounter (Signed)
Rx was last approved 11/17/2021, there is a treatment agreement on file for 10/2021.

## 2022-02-19 DIAGNOSIS — E039 Hypothyroidism, unspecified: Secondary | ICD-10-CM | POA: Diagnosis not present

## 2022-02-19 DIAGNOSIS — N1831 Chronic kidney disease, stage 3a: Secondary | ICD-10-CM

## 2022-02-19 DIAGNOSIS — I5022 Chronic systolic (congestive) heart failure: Secondary | ICD-10-CM

## 2022-02-19 DIAGNOSIS — M85851 Other specified disorders of bone density and structure, right thigh: Secondary | ICD-10-CM

## 2022-02-20 LAB — CBC WITH DIFFERENTIAL/PLATELET
Absolute Monocytes: 994 cells/uL — ABNORMAL HIGH (ref 200–950)
Basophils Absolute: 69 cells/uL (ref 0–200)
Basophils Relative: 1 %
Eosinophils Absolute: 331 cells/uL (ref 15–500)
Eosinophils Relative: 4.8 %
HCT: 39.8 % (ref 35.0–45.0)
Hemoglobin: 13.2 g/dL (ref 11.7–15.5)
Lymphs Abs: 1780 cells/uL (ref 850–3900)
MCH: 29.4 pg (ref 27.0–33.0)
MCHC: 33.2 g/dL (ref 32.0–36.0)
MCV: 88.6 fL (ref 80.0–100.0)
MPV: 11.7 fL (ref 7.5–12.5)
Monocytes Relative: 14.4 %
Neutro Abs: 3726 cells/uL (ref 1500–7800)
Neutrophils Relative %: 54 %
RBC: 4.49 10*6/uL (ref 3.80–5.10)
RDW: 13.4 % (ref 11.0–15.0)
Total Lymphocyte: 25.8 %
WBC: 6.9 10*3/uL (ref 3.8–10.8)

## 2022-02-20 LAB — COMPLETE METABOLIC PANEL WITH GFR
AG Ratio: 1.1 (calc) (ref 1.0–2.5)
ALT: 17 U/L (ref 6–29)
AST: 23 U/L (ref 10–35)
Albumin: 3.9 g/dL (ref 3.6–5.1)
Alkaline phosphatase (APISO): 77 U/L (ref 37–153)
BUN/Creatinine Ratio: 23 (calc) — ABNORMAL HIGH (ref 6–22)
BUN: 25 mg/dL (ref 7–25)
CO2: 31 mmol/L (ref 20–32)
Calcium: 8.9 mg/dL (ref 8.6–10.4)
Chloride: 100 mmol/L (ref 98–110)
Creat: 1.1 mg/dL — ABNORMAL HIGH (ref 0.60–0.95)
Globulin: 3.4 g/dL (calc) (ref 1.9–3.7)
Glucose, Bld: 59 mg/dL — ABNORMAL LOW (ref 65–99)
Potassium: 4 mmol/L (ref 3.5–5.3)
Sodium: 139 mmol/L (ref 135–146)
Total Bilirubin: 0.6 mg/dL (ref 0.2–1.2)
Total Protein: 7.3 g/dL (ref 6.1–8.1)
eGFR: 49 mL/min/{1.73_m2} — ABNORMAL LOW (ref >=60)

## 2022-02-20 LAB — LIPID PANEL
Cholesterol: 200 mg/dL — ABNORMAL HIGH (ref <200)
HDL: 79 mg/dL (ref >=50)
LDL Cholesterol (Calc): 105 mg/dL (calc) — ABNORMAL HIGH
Non-HDL Cholesterol (Calc): 121 mg/dL (calc) (ref <130)
Total CHOL/HDL Ratio: 2.5 (calc) (ref <5.0)
Triglycerides: 70 mg/dL (ref <150)

## 2022-02-20 LAB — TSH: TSH: 6.23 mIU/L — ABNORMAL HIGH (ref 0.40–4.50)

## 2022-02-20 LAB — VITAMIN D 25 HYDROXY (VIT D DEFICIENCY, FRACTURES): Vit D, 25-Hydroxy: 58 ng/mL (ref 30–100)

## 2022-02-25 ENCOUNTER — Encounter: Payer: Medicare Other | Admitting: Internal Medicine

## 2022-02-26 DIAGNOSIS — I872 Venous insufficiency (chronic) (peripheral): Secondary | ICD-10-CM | POA: Diagnosis not present

## 2022-02-26 DIAGNOSIS — D229 Melanocytic nevi, unspecified: Secondary | ICD-10-CM | POA: Diagnosis not present

## 2022-02-26 DIAGNOSIS — L570 Actinic keratosis: Secondary | ICD-10-CM | POA: Diagnosis not present

## 2022-02-26 DIAGNOSIS — L820 Inflamed seborrheic keratosis: Secondary | ICD-10-CM | POA: Diagnosis not present

## 2022-02-27 DIAGNOSIS — M2041 Other hammer toe(s) (acquired), right foot: Secondary | ICD-10-CM | POA: Diagnosis not present

## 2022-02-27 DIAGNOSIS — L602 Onychogryphosis: Secondary | ICD-10-CM | POA: Diagnosis not present

## 2022-02-27 DIAGNOSIS — M2011 Hallux valgus (acquired), right foot: Secondary | ICD-10-CM | POA: Diagnosis not present

## 2022-02-27 DIAGNOSIS — M2042 Other hammer toe(s) (acquired), left foot: Secondary | ICD-10-CM | POA: Diagnosis not present

## 2022-02-27 DIAGNOSIS — M2012 Hallux valgus (acquired), left foot: Secondary | ICD-10-CM | POA: Diagnosis not present
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
## 2022-03-11 ENCOUNTER — Non-Acute Institutional Stay: Payer: Medicare Other | Admitting: Internal Medicine

## 2022-03-11 ENCOUNTER — Encounter: Payer: Self-pay | Admitting: Internal Medicine

## 2022-03-11 VITALS — BP 110/66 | HR 69 | Temp 97.6°F | Resp 18 | Ht 66.0 in | Wt 144.9 lb

## 2022-03-11 DIAGNOSIS — I10 Essential (primary) hypertension: Secondary | ICD-10-CM | POA: Diagnosis not present

## 2022-03-11 DIAGNOSIS — R600 Localized edema: Secondary | ICD-10-CM

## 2022-03-11 DIAGNOSIS — H9193 Unspecified hearing loss, bilateral: Secondary | ICD-10-CM

## 2022-03-11 DIAGNOSIS — I5022 Chronic systolic (congestive) heart failure: Secondary | ICD-10-CM

## 2022-03-11 DIAGNOSIS — R7989 Other specified abnormal findings of blood chemistry: Secondary | ICD-10-CM | POA: Diagnosis not present

## 2022-03-11 DIAGNOSIS — N1831 Chronic kidney disease, stage 3a: Secondary | ICD-10-CM

## 2022-03-11 DIAGNOSIS — R5383 Other fatigue: Secondary | ICD-10-CM | POA: Diagnosis not present

## 2022-03-11 DIAGNOSIS — I480 Paroxysmal atrial fibrillation: Secondary | ICD-10-CM | POA: Diagnosis not present

## 2022-03-11 NOTE — Patient Instructions (Signed)
If the wound in your Right leg gets More Redder , Tender or Discharge let us know
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
## 2022-03-16 ENCOUNTER — Ambulatory Visit: Payer: Medicare Other | Attending: Student | Admitting: Student

## 2022-03-16 ENCOUNTER — Encounter: Payer: Self-pay | Admitting: Student

## 2022-03-16 VITALS — BP 120/70 | HR 60 | Ht 66.0 in | Wt 142.2 lb

## 2022-03-16 DIAGNOSIS — I480 Paroxysmal atrial fibrillation: Secondary | ICD-10-CM | POA: Insufficient documentation

## 2022-03-16 DIAGNOSIS — I420 Dilated cardiomyopathy: Secondary | ICD-10-CM | POA: Insufficient documentation

## 2022-03-16 DIAGNOSIS — I10 Essential (primary) hypertension: Secondary | ICD-10-CM | POA: Insufficient documentation

## 2022-03-16 NOTE — Patient Instructions (Signed)
Medication Instructions:  Your physician recommends that you continue on your current medications as directed. Please refer to the Current Medication list given to you today.  *If you need a refill on your cardiac medications before your next appointment, please call your pharmacy*   Lab Work: TODAY: CBC, FreeT4, TSH  If you have labs (blood work) drawn today and your tests are completely normal, you will receive your results only by: [place] (if you have MyChart) OR A paper copy in the mail If you have any lab test that is abnormal or we need to change your treatment, we will call you to review the results.   Testing/Procedures: None    Follow-Up: At [facility], you and your health needs are our priority.  As part of our continuing mission to provide you with exceptional heart care, we have created designated Provider Care Teams.  These Care Teams include your primary Cardiologist (physician) and Advanced Practice Providers (APPs -  Physician Assistants and Nurse Practitioners) who all work together to provide you with the care you need, when you need it.   Your next appointment:   6 month(s)  The format for your next appointment:   In Person  Provider:   Allegra Lai, MD

## 2022-03-17 LAB — CBC
Hematocrit: 40.3 % (ref 34.0–46.6)
Hemoglobin: 13.3 g/dL (ref 11.1–15.9)
MCH: 29.1 pg (ref 26.6–33.0)
MCHC: 33 g/dL (ref 31.5–35.7)
MCV: 88 fL (ref 79–97)
RBC: 4.57 x10E6/uL (ref 3.77–5.28)
RDW: 13.5 % (ref 11.7–15.4)
WBC: 6.4 10*3/uL (ref 3.4–10.8)

## 2022-03-17 LAB — T4, FREE: Free T4: 2.01 ng/dL — ABNORMAL HIGH (ref 0.82–1.77)

## 2022-03-17 LAB — TSH: TSH: 4.99 u[IU]/mL — ABNORMAL HIGH (ref 0.450–4.500)

## 2022-03-18 ENCOUNTER — Telehealth: Payer: Self-pay

## 2022-03-18 DIAGNOSIS — I480 Paroxysmal atrial fibrillation: Secondary | ICD-10-CM

## 2022-03-18 NOTE — Telephone Encounter (Signed)
The patient has been notified of the result and verbalized understanding.  All questions (if any) were answered. Laura Lee, [facility] 03/18/2022 10:33 AM    Lab orders have been put in. Pt will have her Son call back to make appt for labs. This should be done around 04/29/22.    Laura Lee, Laura Lee, [place] Cc: Virgie Dad, MD; [name], MD With elevated TSH and mildly elevated free T4, please change amiodarone to 200 mg Monday through Friday, hold on Sat/Sunday.   Please recheck TSH and Free T4 in 6 weeks to make sure stable/improving.   Will also forward to PCP as FYI.

## 2022-03-23 ENCOUNTER — Other Ambulatory Visit: Payer: Self-pay | Admitting: Orthopedic Surgery

## 2022-03-23 NOTE — Telephone Encounter (Signed)
Pharmacy requested refill.  Epic LR: 01/19/2022 Contract Date: 10/29/2021  Pended Rx and sent to Dr. Lyndel Safe for approval.

## 2022-03-24 NOTE — Telephone Encounter (Signed)
Pt is returning call. Transferred to April, [place].

## 2022-03-26 DIAGNOSIS — L820 Inflamed seborrheic keratosis: Secondary | ICD-10-CM | POA: Diagnosis not present

## 2022-03-26 DIAGNOSIS — D229 Melanocytic nevi, unspecified: Secondary | ICD-10-CM | POA: Diagnosis not present

## 2022-03-26 DIAGNOSIS — L570 Actinic keratosis: Secondary | ICD-10-CM | POA: Diagnosis not present

## 2022-03-31 DIAGNOSIS — Z23 Encounter for immunization: Secondary | ICD-10-CM | POA: Diagnosis not present

## 2022-04-20 ENCOUNTER — Other Ambulatory Visit: Payer: Self-pay | Admitting: Cardiology

## 2022-04-20 DIAGNOSIS — I4819 Other persistent atrial fibrillation: Secondary | ICD-10-CM

## 2022-04-20 NOTE — Telephone Encounter (Signed)
Eliquis '5mg'$  refill request received. Patient is 88 years old, weight-64.5kg, Crea-1.10 on 02/19/2022, Diagnosis-Afib, and last seen by Oda Kilts on 03/16/2022. Dose is appropriate based on dosing criteria. Will send in refill to requested pharmacy.

## 2022-04-29 ENCOUNTER — Ambulatory Visit: Payer: Medicare Other | Attending: Student

## 2022-04-29 DIAGNOSIS — I480 Paroxysmal atrial fibrillation: Secondary | ICD-10-CM

## 2022-04-29 LAB — TSH: TSH: 5.67 u[IU]/mL — ABNORMAL HIGH (ref 0.450–4.500)

## 2022-04-29 LAB — T4, FREE: Free T4: 1.7 ng/dL (ref 0.82–1.77)

## 2022-04-30 ENCOUNTER — Encounter: Payer: Self-pay | Admitting: Orthopedic Surgery

## 2022-04-30 ENCOUNTER — Ambulatory Visit (INDEPENDENT_AMBULATORY_CARE_PROVIDER_SITE_OTHER): Payer: Medicare Other | Admitting: Orthopedic Surgery

## 2022-04-30 VITALS — BP 122/64 | HR 60 | Temp 97.8°F | Resp 18 | Ht 66.0 in | Wt 144.6 lb

## 2022-04-30 DIAGNOSIS — R600 Localized edema: Secondary | ICD-10-CM

## 2022-04-30 DIAGNOSIS — D229 Melanocytic nevi, unspecified: Secondary | ICD-10-CM | POA: Diagnosis not present

## 2022-04-30 DIAGNOSIS — L299 Pruritus, unspecified: Secondary | ICD-10-CM | POA: Diagnosis not present

## 2022-04-30 DIAGNOSIS — L570 Actinic keratosis: Secondary | ICD-10-CM | POA: Diagnosis not present

## 2022-04-30 DIAGNOSIS — I872 Venous insufficiency (chronic) (peripheral): Secondary | ICD-10-CM | POA: Diagnosis not present

## 2022-04-30 MED ORDER — TRIAMCINOLONE ACETONIDE 0.1 % EX OINT: 1.0000 | TOPICAL_OINTMENT | Freq: Every day | CUTANEOUS | 1 refills | Status: DC

## 2022-04-30 MED ORDER — FUROSEMIDE 40 MG PO TABS: 60.0000 mg | ORAL_TABLET | Freq: Every day | ORAL | 0 refills | Status: DC

## 2022-04-30 NOTE — Progress Notes (Signed)
Careteam: Patient Care Team: Virgie Dad, MD as PCP - General (Internal Medicine) [name], MD as PCP - Cardiology (Cardiology) [name], MD as PCP - Electrophysiology (Cardiology)  Seen by: Windell Moulding, AGNP-C  PLACE OF SERVICE:  [place]  Advanced Directive information    Allergies  Allergen Reactions   Combigan [Brimonidine Tartrate-Timolol] Itching and Other (See Comments)    Red eye    Pneumococcal Vaccines Other (See Comments)    Pain in arms/legs--nerve issues    Chief Complaint  Patient presents with   Acute Visit    Acute Visit for Swelling in left leg     HPI: Patient is a 88 y.o. female seen today for ongoing BLE edema.   Family member present during encounter.   Onset > 1 year. Left venous doppler 01/01/2021 negative for DVT. Remains on Eliquis for atrial fibrillation. She also takes furosemide 40 mg daily. Increased bilateral leg edema for a few days, L>R. She was wearing compressing stockings until her legs got too swollen. Does not always avoid salty foods. She denies sob, weight gain, fever, pain, warmth, redness or injury.   Review of Systems:  Review of Systems  Constitutional:  Negative for fever and malaise/fatigue.  Respiratory:  Negative for cough, shortness of breath and wheezing.   Cardiovascular:  Positive for leg swelling. Negative for chest pain.  Musculoskeletal:  Negative for falls and joint pain.  Neurological:  Negative for weakness.  Psychiatric/Behavioral:  Negative for depression. The patient is not nervous/anxious.    Past Medical History:  Diagnosis Date   Atrial fibrillation ([place])    Breast CA ([place])    Cancer ([place])    Breast and thyroid cancer   Glaucoma    Hearing impaired    Hearing aids   Hypertension    Osteopenia 05/2013   T score -1.9 FRAX 15%/4.3%   Thyroid disease    Cancer   Past Surgical History:  Procedure Laterality Date   APPENDECTOMY     BREAST EXCISIONAL BIOPSY Right     benign   BREAST LUMPECTOMY     [id number] radiation   BREAST SURGERY  1989   Lumpectomy right breast   CARDIOVERSION N/A 02/14/2016   Procedure: CARDIOVERSION;  Surgeon: Sueanne Margarita, MD;  Location: [place];  Service: Cardiovascular;  Laterality: N/A;   CARDIOVERSION N/A 08/04/2018   Procedure: CARDIOVERSION;  Surgeon: Buford Dresser, MD;  Location: [facility] ENDOSCOPY;  Service: Cardiovascular;  Laterality: N/A;   CARDIOVERSION N/A 10/31/2019   Procedure: CARDIOVERSION;  Surgeon: Dorothy Spark, MD;  Location: [facility]OSCOPY;  Service: Cardiovascular;  Laterality: N/A;   CARDIOVERSION N/A 05/02/2020   Procedure: CARDIOVERSION;  Surgeon: Elouise Munroe, MD;  Location: [facility] ENDOSCOPY;  Service: Cardiovascular;  Laterality: N/A;   CARDIOVERSION N/A 09/10/2020   Procedure: CARDIOVERSION;  Surgeon: Werner Lean, MD;  Location: [place];  Service: Cardiovascular;  Laterality: N/A;   Mole excised     Benign   THYROID SURGERY     Removed 1 lobe   TONSILLECTOMY     Social History:   reports that she has never smoked. She has never used smokeless tobacco. She reports current alcohol use. She reports that she does not use drugs.  Family History  Problem Relation Age of Onset   Breast cancer Mother 8   Lung cancer Sister    Heart attack Brother    Breast cancer Daughter 45    Medications: Patient's Medications  New Prescriptions  No medications on file  Previous Medications   AMIODARONE (PACERONE) 200 MG TABLET    Take 1 tablet (200 mg total) by mouth daily.   APIXABAN (ELIQUIS) 5 MG TABS TABLET    TAKE 1 TABLET TWICE A DAY   BETA CAROTENE W/MINERALS (OCUVITE) TABLET    Take 1 tablet by mouth at bedtime.   CALCIUM CITRATE-VITAMIN D (CALCIUM CITRATE + D3 PO)    Take 1 tablet by mouth 2 (two) times daily.   CARBOXYMETHYLCELLULOSE (REFRESH PLUS) 0.5 % SOLN    Place 1 drop into both eyes 3 (three) times daily as needed (dry/irritated eyes.).    CHOLECALCIFEROL  (VITAMIN D) 25 MCG (1000 UNIT) TABLET    Take 1,000 Units by mouth in the morning. chewables   DILTIAZEM (CARDIZEM CD) 300 MG 24 HR CAPSULE    TAKE ONE CAPSULE BY MOUTH DAILY   EMOLLIENT (AQUAPHOR ADVANCED THERAPY) OINT    Apply 1 applicator topically 2 (two) times daily.   FUROSEMIDE (LASIX) 20 MG TABLET    TAKE TWO TABLETS BY MOUTH DAILY   GLUCOSAMINE-CHONDROITIN 500-400 MG TABLET    Take 1 tablet by mouth in the morning and at bedtime.   LATANOPROST (XALATAN) 0.005 % OPHTHALMIC SOLUTION    Place 1 drop into both eyes at bedtime.    LEVOTHYROXINE (SYNTHROID) 100 MCG TABLET    Take 1 tablet (100 mcg total) by mouth daily before breakfast.   MULTIPLE VITAMIN (MULTIVITAMIN WITH MINERALS) TABS TABLET    Take 1 tablet by mouth daily with lunch. Adults 50+   OXYCODONE (OXY IR/ROXICODONE) 5 MG IMMEDIATE RELEASE TABLET    TAKE ONE TABLET BY MOUTH IN THE MORNING AND TAKE ONE TABLET AT BEDTIME   POLYETHYLENE GLYCOL 400 (BLINK TEARS) 0.25 % SOLN    Place 1-2 drops into both eyes 3 (three) times daily as needed (dry/irritated eyes).   POTASSIUM CHLORIDE (KLOR-CON) 10 MEQ TABLET    TAKE TWO TABLETS BY MOUTH DAILY   SACUBITRIL-VALSARTAN (ENTRESTO) 24-26 MG    Take 1 tablet by mouth 2 (two) times daily.   TRIAMCINOLONE OINTMENT (KENALOG) 0.1 %    Apply 1 application topically daily.  Modified Medications   No medications on file  Discontinued Medications   No medications on file    Physical Exam:  There were no vitals filed for this visit. There is no height or weight on file to calculate BMI. Wt Readings from Last 3 Encounters:  03/16/22 142 lb 3.2 oz (64.5 kg)  03/11/22 144 lb 14.4 oz (65.7 kg)  10/29/21 142 lb 11.2 oz (64.7 kg)    Physical Exam Vitals reviewed.  Constitutional:      General: She is not in acute distress. HENT:     Head: Normocephalic.  Eyes:     General:        Right eye: No discharge.        Left eye: No discharge.  Cardiovascular:     Rate and Rhythm: Normal rate and  regular rhythm.     Pulses: Normal pulses.     Heart sounds: Normal heart sounds.  Pulmonary:     Effort: Pulmonary effort is normal. No respiratory distress.     Breath sounds: Examination of the right-middle field reveals rales. Examination of the left-middle field reveals rales. Rales present. No wheezing.  Musculoskeletal:     Cervical back: Neck supple.     Right lower leg: Edema present.     Left lower leg: Edema present.  Comments: LLE 3+ pitting, RLE 2+ pitting  Skin:    General: Skin is warm and dry.     Capillary Refill: Capillary refill takes less than 2 seconds.     Comments: Lower extremities with leathery appearance, 2 small areas of weep to left anterior shin, no sign of infection  Neurological:     General: No focal deficit present.     Mental Status: She is alert and oriented to person, place, and time.     Motor: Weakness present.     Gait: Gait abnormal.  Psychiatric:        Mood and Affect: Mood normal.        Behavior: Behavior normal.     Labs reviewed: Basic Metabolic Panel: Recent Labs    05/21/21 0836 02/19/22 0808 03/16/22 0935 04/29/22 0845  NA 139 139  --   --   K 4.0 4.0  --   --   CL 102 100  --   --   CO2 27 31  --   --   GLUCOSE 112* 59*  --   --   BUN 19 25  --   --   CREATININE 1.16* 1.10*  --   --   CALCIUM 9.1 8.9  --   --   TSH 4.65* 6.23* 4.990* 5.670*   Liver Function Tests: Recent Labs    05/21/21 0836 02/19/22 0808  AST 22 23  ALT 17 17  BILITOT 0.6 0.6  PROT 7.3 7.3   No results for input(s): "LIPASE", "AMYLASE" in the last 8760 hours. No results for input(s): "AMMONIA" in the last 8760 hours. CBC: Recent Labs    05/21/21 0836 06/13/21 1227 02/19/22 0808 03/16/22 0935  WBC 9.2 9.0 6.9 6.4  NEUTROABS 5,235 4,698 3,726  --   HGB 14.3 14.1 13.2 13.3  HCT 43.5 42.9 39.8 40.3  MCV 88.6 89.6 88.6 88  PLT 27*  --   --  CANCELED   Lipid Panel: Recent Labs    05/21/21 0836 02/19/22 0808  CHOL 185 200*  HDL  78 79  LDLCALC 91 105*  TRIG 74 70  CHOLHDL 2.4 2.5   TSH: Recent Labs    02/19/22 0808 03/16/22 0935 04/29/22 0845  TSH 6.23* 4.990* 5.670*   A1C: No results found for: "HGBA1C"   Assessment/Plan 1. Bilateral edema of lower extremity - ongoing - related to chronic diastolic CHF - mild rales noted on exam, LLE 3+ pitting/weeping, RLE 2+ pitting  - start furosemide 60 mg po BID x 3 days, then resume 40 mg daily - cont KCL - cont compression stockings - discussed low sodium diet - may consider switching to torsemide in future - furosemide (LASIX) 40 MG tablet; Take 1.5 tablets (60 mg total) by mouth daily for 3 days.  Dispense: 9 tablet; Refill: 0  2. Itching - triamcinolone ointment (KENALOG) 0.1 %; Apply 1 Application topically daily.  Dispense: 30 g; Refill: 1  Total time: 22 minutes. Greater than 50% of total time spent doing patient education regarding CHF, lower leg edema including symptom/medication management.    Next appt: none Kapri Nero [place], [facility] [phone]

## 2022-04-30 NOTE — Patient Instructions (Signed)
Please take Furosemide 60 mg (1.5 tablets) twice daily x 3 days  Continue potassium chloride daily  Elevate legs in recliner  Wear compression stocking when you can  Avoid salty foods- they will make swelling worse!   Contact provider if symptoms do not improve or worsen

## 2022-05-19 ENCOUNTER — Other Ambulatory Visit: Payer: Self-pay | Admitting: Internal Medicine

## 2022-05-19 NOTE — Telephone Encounter (Signed)
Medication last filled 03/23/2022. Patient has opioid contract on file and up to date.   Medication pended and sent to Veleta Miners, MD

## 2022-05-28 DIAGNOSIS — I872 Venous insufficiency (chronic) (peripheral): Secondary | ICD-10-CM | POA: Diagnosis not present

## 2022-05-28 DIAGNOSIS — L570 Actinic keratosis: Secondary | ICD-10-CM | POA: Diagnosis not present

## 2022-05-28 DIAGNOSIS — L814 Other melanin hyperpigmentation: Secondary | ICD-10-CM | POA: Diagnosis not present

## 2022-06-10 DIAGNOSIS — H35359 Cystoid macular degeneration, unspecified eye: Secondary | ICD-10-CM | POA: Diagnosis not present

## 2022-06-25 DIAGNOSIS — L821 Other seborrheic keratosis: Secondary | ICD-10-CM | POA: Diagnosis not present

## 2022-06-25 DIAGNOSIS — I872 Venous insufficiency (chronic) (peripheral): Secondary | ICD-10-CM | POA: Diagnosis not present

## 2022-06-25 DIAGNOSIS — L814 Other melanin hyperpigmentation: Secondary | ICD-10-CM | POA: Diagnosis not present

## 2022-06-25 DIAGNOSIS — L570 Actinic keratosis: Secondary | ICD-10-CM | POA: Diagnosis not present

## 2022-06-29 ENCOUNTER — Other Ambulatory Visit: Payer: Self-pay | Admitting: Orthopedic Surgery

## 2022-06-29 NOTE — Telephone Encounter (Signed)
Medication Lasix has end date. Medication pend and sent to PCP Virgie Dad, MD

## 2022-07-02 ENCOUNTER — Encounter (HOSPITAL_COMMUNITY): Payer: Self-pay | Admitting: *Deleted

## 2022-07-13 ENCOUNTER — Other Ambulatory Visit: Payer: Self-pay | Admitting: Internal Medicine

## 2022-07-13 NOTE — Telephone Encounter (Signed)
Pharmacy requested refill.  Epic LR: 05/19/2022 Contract Date:10/29/2021  Pended Rx and sent to Dr. Lyndel Safe for approval.

## 2022-07-31 ENCOUNTER — Other Ambulatory Visit: Payer: Self-pay | Admitting: Cardiology

## 2022-08-04 ENCOUNTER — Other Ambulatory Visit: Payer: Self-pay | Admitting: Cardiology

## 2022-09-07 ENCOUNTER — Other Ambulatory Visit: Payer: Medicare Other

## 2022-09-07 DIAGNOSIS — I5022 Chronic systolic (congestive) heart failure: Secondary | ICD-10-CM | POA: Diagnosis not present

## 2022-09-07 DIAGNOSIS — R600 Localized edema: Secondary | ICD-10-CM | POA: Diagnosis not present

## 2022-09-07 DIAGNOSIS — I10 Essential (primary) hypertension: Secondary | ICD-10-CM

## 2022-09-07 DIAGNOSIS — R7989 Other specified abnormal findings of blood chemistry: Secondary | ICD-10-CM

## 2022-09-07 LAB — LIPID PANEL
Cholesterol: 190 mg/dL (ref <200)
HDL: 72 mg/dL (ref >=50)
LDL Cholesterol (Calc): 104 mg/dL (calc) — ABNORMAL HIGH
Non-HDL Cholesterol (Calc): 118 mg/dL (calc) (ref <130)
Total CHOL/HDL Ratio: 2.6 (calc) (ref <5.0)
Triglycerides: 56 mg/dL (ref <150)

## 2022-09-07 LAB — COMPLETE METABOLIC PANEL WITH GFR
AG Ratio: 1.1 (calc) (ref 1.0–2.5)
ALT: 13 U/L (ref 6–29)
AST: 15 U/L (ref 10–35)
Albumin: 3.7 g/dL (ref 3.6–5.1)
Alkaline phosphatase (APISO): 64 U/L (ref 37–153)
BUN/Creatinine Ratio: 21 (calc) (ref 6–22)
BUN: 24 mg/dL (ref 7–25)
CO2: 30 mmol/L (ref 20–32)
Calcium: 8.8 mg/dL (ref 8.6–10.4)
Chloride: 102 mmol/L (ref 98–110)
Creat: 1.12 mg/dL — ABNORMAL HIGH (ref 0.60–0.95)
Globulin: 3.3 g/dL (calc) (ref 1.9–3.7)
Glucose, Bld: 76 mg/dL (ref 65–99)
Potassium: 4.2 mmol/L (ref 3.5–5.3)
Sodium: 141 mmol/L (ref 135–146)
Total Bilirubin: 0.5 mg/dL (ref 0.2–1.2)
Total Protein: 7 g/dL (ref 6.1–8.1)
eGFR: 47 mL/min/{1.73_m2} — ABNORMAL LOW (ref >=60)

## 2022-09-07 LAB — CBC WITH DIFFERENTIAL/PLATELET
Absolute Monocytes: 937 cells/uL (ref 200–950)
Basophils Absolute: 79 cells/uL (ref 0–200)
Basophils Relative: 1.2 %
Eosinophils Absolute: 251 cells/uL (ref 15–500)
Eosinophils Relative: 3.8 %
HCT: 40.2 % (ref 35.0–45.0)
Hemoglobin: 13 g/dL (ref 11.7–15.5)
Lymphs Abs: 1696 cells/uL (ref 850–3900)
MCH: 28.7 pg (ref 27.0–33.0)
MCHC: 32.3 g/dL (ref 32.0–36.0)
MCV: 88.7 fL (ref 80.0–100.0)
MPV: 11.1 fL (ref 7.5–12.5)
Monocytes Relative: 14.2 %
Neutro Abs: 3637 cells/uL (ref 1500–7800)
Neutrophils Relative %: 55.1 %
Platelets: 68 10*3/uL — ABNORMAL LOW (ref 140–400)
RBC: 4.53 10*6/uL (ref 3.80–5.10)
RDW: 13.8 % (ref 11.0–15.0)
Total Lymphocyte: 25.7 %
WBC: 6.6 10*3/uL (ref 3.8–10.8)

## 2022-09-07 LAB — TSH: TSH: 5.38 mIU/L — ABNORMAL HIGH (ref 0.40–4.50)

## 2022-09-14 ENCOUNTER — Other Ambulatory Visit: Payer: Self-pay | Admitting: Internal Medicine

## 2022-09-14 NOTE — Telephone Encounter (Signed)
Pharmacy requested refill.  Epic LR:  07/14/2022 Contract Date: 10/29/2021  Pended Rx and sent to Dr. Chales Abrahams for approval.

## 2022-09-16 ENCOUNTER — Non-Acute Institutional Stay: Payer: Medicare Other | Admitting: Internal Medicine

## 2022-09-16 ENCOUNTER — Encounter: Payer: Self-pay | Admitting: Internal Medicine

## 2022-09-16 VITALS — BP 124/70 | HR 67 | Temp 97.8°F | Resp 16 | Ht 66.0 in | Wt 142.2 lb

## 2022-09-16 DIAGNOSIS — M170 Bilateral primary osteoarthritis of knee: Secondary | ICD-10-CM | POA: Insufficient documentation

## 2022-09-16 DIAGNOSIS — G3184 Mild cognitive impairment, so stated: Secondary | ICD-10-CM

## 2022-09-16 DIAGNOSIS — I872 Venous insufficiency (chronic) (peripheral): Secondary | ICD-10-CM | POA: Insufficient documentation

## 2022-09-16 DIAGNOSIS — R7989 Other specified abnormal findings of blood chemistry: Secondary | ICD-10-CM

## 2022-09-16 DIAGNOSIS — H9193 Unspecified hearing loss, bilateral: Secondary | ICD-10-CM | POA: Diagnosis not present

## 2022-09-16 DIAGNOSIS — I5022 Chronic systolic (congestive) heart failure: Secondary | ICD-10-CM | POA: Diagnosis not present

## 2022-09-16 DIAGNOSIS — I480 Paroxysmal atrial fibrillation: Secondary | ICD-10-CM | POA: Diagnosis not present

## 2022-09-16 DIAGNOSIS — H409 Unspecified glaucoma: Secondary | ICD-10-CM | POA: Insufficient documentation

## 2022-09-16 DIAGNOSIS — I10 Essential (primary) hypertension: Secondary | ICD-10-CM | POA: Diagnosis not present

## 2022-09-16 DIAGNOSIS — D696 Thrombocytopenia, unspecified: Secondary | ICD-10-CM | POA: Insufficient documentation

## 2022-09-16 DIAGNOSIS — R251 Tremor, unspecified: Secondary | ICD-10-CM | POA: Diagnosis not present

## 2022-09-16 DIAGNOSIS — F5101 Primary insomnia: Secondary | ICD-10-CM | POA: Insufficient documentation

## 2022-09-16 DIAGNOSIS — R600 Localized edema: Secondary | ICD-10-CM

## 2022-09-16 DIAGNOSIS — H900 Conductive hearing loss, bilateral: Secondary | ICD-10-CM | POA: Insufficient documentation

## 2022-09-16 DIAGNOSIS — M5430 Sciatica, unspecified side: Secondary | ICD-10-CM | POA: Insufficient documentation

## 2022-09-16 DIAGNOSIS — D6869 Other thrombophilia: Secondary | ICD-10-CM | POA: Insufficient documentation

## 2022-09-23 NOTE — Progress Notes (Unsigned)
  Electrophysiology Office Note:   Date:  09/24/2022  ID:  Laura Lee, DOB 1931/09/04, MRN [id number]  Primary Cardiologist: Will Jorja Loa, MD Electrophysiologist: Regan Lemming, MD   History of Present Illness:   Laura Lee is a 89 y.o. female with h/o persistent AF on amiodarone, chronic diastolic CHF, HTN, and deconditioning seen today for routine electrophysiology followup. Since last being seen in our clinic the patient reports doing well from a cardiac perspective.  she denies chest pain, palpitations, dyspnea, PND, orthopnea, nausea, vomiting, dizziness, syncope, edema, weight gain, or early satiety.   Review of systems complete and found to be negative unless listed in HPI.   Studies Reviewed:    EKG is ordered today. Personal review shows NSR at 62 bpm with 1st degree AV block at 246 ms.   Physical Exam:   VS:  BP 102/62   Pulse 62   Ht 5\' 6"  (1.676 m)   Wt 142 lb 9.6 oz (64.7 kg)   SpO2 98%   BMI 23.02 kg/m    Wt Readings from Last 3 Encounters:  09/24/22 142 lb 9.6 oz (64.7 kg)  09/16/22 142 lb 3.2 oz (64.5 kg)  04/30/22 144 lb 9.6 oz (65.6 kg)     GEN: Well nourished, well developed in no acute distress NECK: No JVD; No carotid bruits CARDIAC: Regular rate and rhythm, no murmurs, rubs, gallops RESPIRATORY:  Clear to auscultation without rales, wheezing or rhonchi  ABDOMEN: Soft, non-tender, non-distended EXTREMITIES:  No edema; No deformity   ASSESSMENT AND PLAN:    Persistent AF EKG today shows NSR Continue eliquis for CHA2DS2VASc of at least 5.  Continue amiodarone 200 mg Monday through Friday.  Continue diltiazem CMET stable 4/15, TSH slightly up. Will check Free T4 today.     Chronic diastolic CHF Felt secondary to AF  15-20% in 2017 >> 55% by echo in 2018 >> 40-45% in 2022 Continue Entresto BB stopped last historically with fatigue (noted bradycardia as well) Volume status stable on exam.  Recent K/Cr stable.    HTN Stable on  current regimen   Hypothyroid TFTs today.   Follow up with Dr. Elberta Fortis in 6 months  Signed, Graciella Freer, PA-C

## 2022-09-24 ENCOUNTER — Encounter: Payer: Self-pay | Admitting: Student

## 2022-09-24 ENCOUNTER — Ambulatory Visit: Payer: Medicare Other | Attending: Student | Admitting: Student

## 2022-09-24 VITALS — BP 102/62 | HR 62 | Ht 66.0 in | Wt 142.6 lb

## 2022-09-24 DIAGNOSIS — I420 Dilated cardiomyopathy: Secondary | ICD-10-CM | POA: Diagnosis not present

## 2022-09-24 DIAGNOSIS — I480 Paroxysmal atrial fibrillation: Secondary | ICD-10-CM | POA: Diagnosis not present

## 2022-09-24 DIAGNOSIS — E039 Hypothyroidism, unspecified: Secondary | ICD-10-CM

## 2022-09-24 DIAGNOSIS — I10 Essential (primary) hypertension: Secondary | ICD-10-CM

## 2022-09-24 NOTE — Patient Instructions (Signed)
Medication Instructions:  Your physician recommends that you continue on your current medications as directed. Please refer to the Current Medication list given to you today.  *If you need a refill on your cardiac medications before your next appointment, please call your pharmacy*   Lab Work: TSH and FreeT4-TODAY If you have labs (blood work) drawn today and your tests are completely normal, you will receive your results only by: MyChart Message (if you have MyChart) OR A paper copy in the mail If you have any lab test that is abnormal or we need to change your treatment, we will call you to review the results.   Follow-Up: At [facility], you and your health needs are our priority.  As part of our continuing mission to provide you with exceptional heart care, we have created designated Provider Care Teams.  These Care Teams include your primary Cardiologist (physician) and Advanced Practice Providers (APPs -  Physician Assistants and Nurse Practitioners) who all work together to provide you with the care you need, when you need it.  Your next appointment:   6 month(s)  Provider:   Loman Brooklyn, MD

## 2022-09-25 DIAGNOSIS — H401211 Low-tension glaucoma, right eye, mild stage: Secondary | ICD-10-CM | POA: Diagnosis not present

## 2022-09-25 DIAGNOSIS — H401223 Low-tension glaucoma, left eye, severe stage: Secondary | ICD-10-CM | POA: Diagnosis not present

## 2022-09-25 LAB — T4, FREE: Free T4: 1.89 ng/dL — ABNORMAL HIGH (ref 0.82–1.77)

## 2022-09-30 ENCOUNTER — Other Ambulatory Visit: Payer: Self-pay | Admitting: *Deleted

## 2022-09-30 DIAGNOSIS — E039 Hypothyroidism, unspecified: Secondary | ICD-10-CM

## 2022-09-30 DIAGNOSIS — Z79899 Other long term (current) drug therapy: Secondary | ICD-10-CM
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
## 2022-10-13 DIAGNOSIS — I872 Venous insufficiency (chronic) (peripheral): Secondary | ICD-10-CM | POA: Diagnosis not present

## 2022-10-13 DIAGNOSIS — L0889 Other specified local infections of the skin and subcutaneous tissue: Secondary | ICD-10-CM | POA: Diagnosis not present

## 2022-10-26 ENCOUNTER — Encounter: Payer: Self-pay | Admitting: Orthopedic Surgery

## 2022-10-26 ENCOUNTER — Non-Acute Institutional Stay (INDEPENDENT_AMBULATORY_CARE_PROVIDER_SITE_OTHER): Payer: Medicare Other | Admitting: Orthopedic Surgery

## 2022-10-26 VITALS — BP 100/60 | HR 71 | Temp 97.4°F | Resp 16 | Ht 66.0 in | Wt 143.2 lb

## 2022-10-26 DIAGNOSIS — Z0000 Encounter for general adult medical examination without abnormal findings: Secondary | ICD-10-CM | POA: Diagnosis not present

## 2022-10-26 DIAGNOSIS — E2839 Other primary ovarian failure: Secondary | ICD-10-CM

## 2022-10-26 DIAGNOSIS — L0390 Cellulitis, unspecified: Secondary | ICD-10-CM

## 2022-10-26 DIAGNOSIS — M8580 Other specified disorders of bone density and structure, unspecified site: Secondary | ICD-10-CM

## 2022-10-26 MED ORDER — MUPIROCIN 2 % EX OINT
1.0000 | TOPICAL_OINTMENT | Freq: Every day | CUTANEOUS | 0 refills | Status: AC
Start: 2022-10-26 — End: 2022-11-05

## 2022-10-26 NOTE — Progress Notes (Signed)
Subjective:   Laura Lee is a 89 y.o. female who presents for Medicare Annual (Subsequent) preventive examination.  Place of Service: [facility] clinic Provider: Hazle Nordmann, AGNP-C   Review of Systems     Cardiac Risk Factors include: advanced age (>89men, >77 women);hypertension     Objective:    Today's Vitals   10/26/22 1302  BP: 100/60  Pulse: 71  Resp: 16  Temp: (!) 97.4 F (36.3 C)  SpO2: 95%  Weight: 143 lb 3.2 oz (65 kg)  Height: 5\' 6"  (1.676 m)   Body mass index is 23.11 kg/m.     10/26/2022    1:06 PM 09/16/2022   10:15 AM 04/30/2022    1:08 PM 03/11/2022   10:25 AM 09/22/2021    2:25 PM 01/01/2021   11:53 AM 12/24/2020    1:41 PM  Advanced Directives  Does Patient Have a Medical Advance Directive? Yes Yes Yes Yes Yes Yes No  Type of Estate agent of [place];Living will;Out of facility DNR (pink MOST or yellow form) Healthcare Power of [place];Living will;Out of facility DNR (pink MOST or yellow form) Healthcare Power of [place];Living will;Out of facility DNR (pink MOST or yellow form) Healthcare Power of [place];Living will;Out of facility DNR (pink MOST or yellow form) Healthcare Power of [place];Living will;Out of facility DNR (pink MOST or yellow form) Out of facility DNR (pink MOST or yellow form);Living will;Healthcare Power of Attorney   Does patient want to make changes to medical advance directive? No - Patient declined No - Patient declined  No - Patient declined No - Patient declined No - Patient declined No - Patient declined  Copy of Healthcare Power of Attorney in Chart? Yes - validated most recent copy scanned in chart (See row information) Yes - validated most recent copy scanned in chart (See row information) Yes - validated most recent copy scanned in chart (See row information) Yes - validated most recent copy scanned in chart (See row information) Yes - validated most recent copy scanned in chart (See row  information) Yes - validated most recent copy scanned in chart (See row information)   Pre-existing out of facility DNR order (yellow form or pink MOST form)      Yellow form placed in chart (order not valid for inpatient use)     Current Medications (verified) Outpatient Encounter Medications as of 10/26/2022  Medication Sig   amiodarone (PACERONE) 200 MG tablet Take 200 mg by mouth. M-F only   apixaban (ELIQUIS) 5 MG TABS tablet TAKE 1 TABLET TWICE A DAY   beta carotene w/minerals (OCUVITE) tablet Take 1 tablet by mouth at bedtime.   Calcium Citrate-Vitamin D (CALCIUM CITRATE + D3 PO) Take 1 tablet by mouth 2 (two) times daily.   carboxymethylcellulose (REFRESH PLUS) 0.5 % SOLN Place 1 drop into both eyes 3 (three) times daily as needed (dry/irritated eyes.).    cholecalciferol (VITAMIN D) 25 MCG (1000 UNIT) tablet Take 1,000 Units by mouth in the morning. chewables   diltiazem (CARDIZEM CD) 300 MG 24 hr capsule TAKE ONE CAPSULE BY MOUTH DAILY   Emollient (AQUAPHOR ADVANCED THERAPY) OINT Apply 1 applicator topically 2 (two) times daily.   furosemide (LASIX) 20 MG tablet TAKE TWO TABLETS BY MOUTH DAILY   glucosamine-chondroitin 500-400 MG tablet Take 1 tablet by mouth in the morning and at bedtime.   latanoprost (XALATAN) 0.005 % ophthalmic solution Place 1 drop into both eyes at bedtime.    levothyroxine (SYNTHROID) 100  MCG tablet Take 1 tablet (100 mcg total) by mouth daily before breakfast.   Multiple Vitamin (MULTIVITAMIN WITH MINERALS) TABS tablet Take 1 tablet by mouth daily with lunch. Adults 50+   mupirocin ointment (BACTROBAN) 2 % Apply 1 Application topically daily for 10 days.   oxyCODONE (OXY IR/ROXICODONE) 5 MG immediate release tablet take 1 tablet in the morning and 1 tablet at bedtime.   Polyethylene Glycol 400 (BLINK TEARS) 0.25 % SOLN Place 1-2 drops into both eyes 3 (three) times daily as needed (dry/irritated eyes).   potassium chloride (KLOR-CON) 10 MEQ tablet Take 10 mEq  by mouth daily.   sacubitril-valsartan (ENTRESTO) 24-26 MG TAKE 1 TABLET TWICE A DAY   triamcinolone ointment (KENALOG) 0.1 % Apply 1 Application topically daily.   No facility-administered encounter medications on file as of 10/26/2022.    Allergies (verified) Combigan [brimonidine tartrate-timolol] and Pneumococcal vaccines   History: Past Medical History:  Diagnosis Date   Atrial fibrillation (HCC)    Breast CA (HCC)    Cancer (HCC)    Breast and thyroid cancer   Glaucoma    Hearing impaired    Hearing aids   Hypertension    Osteopenia 05/2013   T score -1.9 FRAX 15%/4.3%   Thyroid disease    Cancer   Past Surgical History:  Procedure Laterality Date   APPENDECTOMY     BREAST EXCISIONAL BIOPSY Right    benign   BREAST LUMPECTOMY     [id number] radiation   BREAST SURGERY  1989   Lumpectomy right breast   CARDIOVERSION N/A 02/14/2016   Procedure: CARDIOVERSION;  Surgeon: Quintella Reichert, MD;  Location: [facility] ENDOSCOPY;  Service: Cardiovascular;  Laterality: N/A;   CARDIOVERSION N/A 08/04/2018   Procedure: CARDIOVERSION;  Surgeon: Jodelle Red, MD;  Location: [facility] ENDOSCOPY;  Service: Cardiovascular;  Laterality: N/A;   CARDIOVERSION N/A 10/31/2019   Procedure: CARDIOVERSION;  Surgeon: Lars Masson, MD;  Location: [facility] ENDOSCOPY;  Service: Cardiovascular;  Laterality: N/A;   CARDIOVERSION N/A 05/02/2020   Procedure: CARDIOVERSION;  Surgeon: Parke Poisson, MD;  Location: [facility] ENDOSCOPY;  Service: Cardiovascular;  Laterality: N/A;   CARDIOVERSION N/A 09/10/2020   Procedure: CARDIOVERSION;  Surgeon: Christell Constant, MD;  Location: [facility] ENDOSCOPY;  Service: Cardiovascular;  Laterality: N/A;   Mole excised     Benign   THYROID SURGERY     Removed 1 lobe   TONSILLECTOMY     Family History  Problem Relation Age of Onset   Breast cancer Mother 42   Lung cancer Sister    Heart attack Brother    Breast cancer Daughter 33   Social History   Socioeconomic  History   Marital status: Widowed    Spouse name: Not on file   Number of children: 2   Years of education: Not on file   Highest education level: Doctorate  Occupational History   Occupation: retired  Tobacco Use   Smoking status: Never   Smokeless tobacco: Never  Vaping Use   Vaping Use: Never used  Substance and Sexual Activity   Alcohol use: Not Currently   Drug use: No   Sexual activity: Never    Comment: 1st intercourse 89 yo-1 partner  Other Topics Concern   Not on file  Social History Narrative   Not on file   Social Determinants of Health   Financial Resource Strain: Low Risk  (10/26/2022)   Overall Financial Resource Strain (CARDIA)    Difficulty of Paying Living Expenses: Not hard  at all  Food Insecurity: No Food Insecurity (10/26/2022)   Hunger Vital Sign    Worried About Running Out of Food in the Last Year: Never true    Ran Out of Food in the Last Year: Never true  Transportation Needs: No Transportation Needs (10/26/2022)   PRAPARE - Administrator, Civil Service (Medical): No    Lack of Transportation (Non-Medical): No  Physical Activity: Sufficiently Active (10/26/2022)   Exercise Vital Sign    Days of Exercise per Week: 3 days    Minutes of Exercise per Session: 50 min  Stress: No Stress Concern Present (10/26/2022)   [employer] of Occupational Health - Occupational Stress Questionnaire    Feeling of Stress : Only a little  Social Connections: Moderately Isolated (10/26/2022)   Social Connection and Isolation Panel [NHANES]    Frequency of Communication with Friends and Family: Twice a week    Frequency of Social Gatherings with Friends and Family: Once a week    Attends Religious Services: More than 4 times per year    Active Member of [employer] or Organizations: No    Attends Banker Meetings: Never    Marital Status: Widowed    Tobacco Counseling Counseling given: Not Answered   Clinical Intake:  Pre-visit preparation  completed: No  Pain : No/denies pain     BMI - recorded: 23.11 Nutritional Status: BMI of 19-24  Normal Nutritional Risks: Non-healing wound Diabetes: No  How often do you need to have someone help you when you read instructions, pamphlets, or other written materials from your doctor or pharmacy?: 1 - Never What is the last grade level you completed in school?: [employer] at [employer], some post grad classes  Diabetic?No  Interpreter Needed?: No      Activities of Daily Living    10/26/2022    1:24 PM  In your present state of health, do you have any difficulty performing the following activities:  Hearing? 1  Vision? 0  Difficulty concentrating or making decisions? 0  Walking or climbing stairs? 1  Dressing or bathing? 0  Doing errands, shopping? 1  Preparing Food and eating ? N  Using the Toilet? N  In the past six months, have you accidently leaked urine? Y  Do you have problems with loss of bowel control? N  Managing your Medications? N  Managing your Finances? N  Housekeeping or managing your Housekeeping? N    Patient Care Team: Mahlon Gammon, MD as PCP - General (Internal Medicine) Regan Lemming, MD as PCP - Cardiology (Cardiology) Regan Lemming, MD as PCP - Electrophysiology (Cardiology)  Indicate any recent Medical Services you may have received from other than [facility] providers in the past year (date may be approximate).     Assessment:   This is a routine wellness examination for [place].  Hearing/Vision screen Hearing Screening - Comments:: No hearing concerns. Patient wears hearing aids.  Vision Screening - Comments:: No vision concerns. Patient wears prescription glasses. Patient last eye exam May 2024.  Dietary issues and exercise activities discussed: Current Exercise Habits: Structured exercise class, Type of exercise: yoga;walking, Time (Minutes): 45, Frequency (Times/Week): 3, Weekly Exercise (Minutes/Week): 135, Intensity: Mild, Exercise  limited by: cardiac condition(s)   Goals Addressed             This Visit's Progress    Maintain Mobility and Function   On track    Evidence-based guidance:  Emphasize the importance of physical activity and  aerobic exercise as included in treatment plan; assess barriers to adherence; consider patient's abilities and preferences.  Encourage gradual increase in activity or exercise instead of stopping if pain occurs.  Reinforce individual therapy exercise prescription, such as strengthening, stabilization and stretching programs.  Promote optimal body mechanics to stabilize the spine with lifting and functional activity.  Encourage activity and mobility modifications to facilitate optimal function, such as using a log roll for bed mobility or dressing from a seated position.  Reinforce individual adaptive equipment recommendations to limit excessive spinal movements, such as a Event organiser.  Assess adequacy of sleep; encourage use of sleep hygiene techniques, such as bedtime routine; use of white noise; dark, cool bedroom; avoiding daytime naps, heavy meals or exercise before bedtime.  Promote positions and modification to optimize sleep and sexual activity; consider pillows or positioning devices to assist in maintaining neutral spine.  Explore options for applying ergonomic principles at work and home, such as frequent position changes, using ergonomically designed equipment and working at optimal height.  Promote modifications to increase comfort with driving such as lumbar support, optimizing seat and steering wheel position, using cruise control and taking frequent rest stops to stretch and walk.   Notes:        Depression Screen    10/26/2022    1:22 PM 10/26/2022    1:04 PM 09/16/2022   10:15 AM 03/11/2022   10:25 AM 09/22/2021    2:23 PM  PHQ 2/9 Scores  PHQ - 2 Score 0 0 0 0 0    Fall Risk    10/26/2022    1:24 PM 10/26/2022    1:04 PM 09/16/2022   10:15 AM 04/30/2022     1:08 PM 03/11/2022   10:25 AM  Fall Risk   Falls in the past year? 0 0 0 0 0  Number falls in past yr: 0 0 0 0 0  Injury with Fall? 0 0 0 0 0  Risk for fall due to : No Fall Risks No Fall Risks No Fall Risks No Fall Risks History of fall(s)  Follow up Falls evaluation completed;Education provided;Falls prevention discussed Falls evaluation completed Falls evaluation completed Falls evaluation completed Falls evaluation completed    FALL RISK PREVENTION PERTAINING TO THE HOME:  Any stairs in or around the home? No  If so, are there any without handrails? No  Home free of loose throw rugs in walkways, pet beds, electrical cords, etc? Yes  Adequate lighting in your home to reduce risk of falls? Yes   ASSISTIVE DEVICES UTILIZED TO PREVENT FALLS:  Life alert? Yes  Use of a cane, walker or w/c? No  Grab bars in the bathroom? Yes  Shower chair or bench in shower? No  Elevated toilet seat or a handicapped toilet? Yes   TIMED UP AND GO:  Was the test performed? No .  Length of time to ambulate 10 feet: N/A sec.   Gait slow and steady with assistive device  Cognitive Function:    10/26/2022    1:06 PM 09/22/2021    2:25 PM  MMSE - Mini Mental State Exam  Orientation to time 4 5  Orientation to time comments  year 2023, season spring, date 1st, day monday, month may.  Orientation to Place 5 5  Orientation to Place-comments  state [place], county [place], city [place], facility [facility], provider Anael Rosch.  Registration 3 3  Attention/ Calculation 5 5  Attention/Calculation-comments  WORLD.Marland Kitchen backwards DLROW  Recall 3 3  Language- name 2 objects 2 2  Language- repeat 1 1  Language- follow 3 step command 3 3  Language- read & follow direction 1 1  Write a sentence 1 1  Copy design 0 1  Total score 28 30        Immunizations Immunization History  Administered Date(s) Administered   Influenza Split 03/18/2011, 03/03/2012, 03/07/2013, 04/03/2014, 03/29/2015,  02/22/2017   Influenza, High Dose Seasonal PF 03/04/2016, 03/19/2017, 02/17/2018, 02/27/2021   Influenza,inj,Quad PF,6+ Mos 03/24/2013   Influenza-Unspecified 03/04/2016, 03/26/2020   Moderna Covid-19 Vaccine Bivalent Booster 29yrs & up 11/05/2021   Moderna SARS-COV2 Booster Vaccination 10/30/2020   Moderna Sars-Covid-2 Vaccination 05/31/2019, 06/26/2019   PFIZER Comirnaty(Gray Top)Covid-19 Tri-Sucrose Vaccine 03/30/2022   Pneumococcal Polysaccharide-23 07/02/1999, 11/13/2019   Td 07/10/1998   Td (Adult) 07/10/1998   Tdap 11/30/2008   Unspecified SARS-COV-2 Vaccination 05/29/2019, 06/26/2019   Zoster, Live 11/30/2008    TDAP status: Due, Education has been provided regarding the importance of this vaccine. Advised may receive this vaccine at local pharmacy or Health Dept. Aware to provide a copy of the vaccination record if obtained from local pharmacy or Health Dept. Verbalized acceptance and understanding. TDAP status: Due, Education has been provided regarding the importance of this vaccine. Advised may receive this vaccine at local pharmacy or Health Dept. Aware to provide a copy of the vaccination record if obtained from local pharmacy or Health Dept. Verbalized acceptance and understanding. Flu Vaccine status: Up to date  Pneumococcal vaccine status: Declined,  Education has been provided regarding the importance of this vaccine but patient still declined. Advised may receive this vaccine at local pharmacy or Health Dept. Aware to provide a copy of the vaccination record if obtained from local pharmacy or Health Dept. Verbalized acceptance and understanding. Declined due to allergic reaction from pneumococcal in [id number]  Covid-19 vaccine status: Completed vaccines  Qualifies for Shingles Vaccine? Yes   Zostavax completed Yes   Shingrix Completed?: No.    Education has been provided regarding the importance of this vaccine. Patient has been advised to call insurance company to determine  out of pocket expense if they have not yet received this vaccine. Advised may also receive vaccine at local pharmacy or Health Dept. Verbalized acceptance and understanding.  Screening Tests Health Maintenance  Topic Date Due   COVID-19 Vaccine (7 - 2023-24 season) 05/25/2022   INFLUENZA VACCINE  12/24/2022   Medicare Annual Wellness (AWV)  10/26/2023   DEXA SCAN  Completed   HPV VACCINES  Aged Out   DTaP/Tdap/Td  Discontinued   Pneumonia Vaccine 20+ Years old  Discontinued   Zoster Vaccines- Shingrix  Discontinued    Health Maintenance  Health Maintenance Due  Topic Date Due   COVID-19 Vaccine (7 - 2023-24 season) 05/25/2022    Colorectal cancer screening: No longer required.   Mammogram status: No longer required due to advanced age.  Bone Density status: Completed 2015. Results reflect: Bone density results: OSTEOPENIA. Repeat every 2 years.  Lung Cancer Screening: (Low Dose CT Chest recommended if Age 77-80 years, 30 pack-year currently smoking OR have quit w/in 15years.) does not qualify.   Lung Cancer Screening Referral: No  Additional Screening:  Hepatitis C Screening: does not qualify; Completed   Vision Screening: Recommended annual ophthalmology exams for early detection of glaucoma and other disorders of the eye. Is the patient up to date with their annual eye exam?  Yes  Who is the provider or what is the name of the office in  which the patient attends annual eye exams? Dr. Ander Purpura If pt is not established with a provider, would they like to be referred to a provider to establish care? No .   Dental Screening: Recommended annual dental exams for proper oral hygiene  Community Resource Referral / Chronic Care Management: CRR required this visit?  No   CCM required this visit?  No      Plan:     I have personally reviewed and noted the following in the patient's chart:   Medical and social history Use of alcohol, tobacco or illicit drugs  Current  medications and supplements including opioid prescriptions. Patient is not currently taking opioid prescriptions. Functional ability and status Nutritional status Physical activity Advanced directives List of other physicians Hospitalizations, surgeries, and ER visits in previous 12 months Vitals Screenings to include cognitive, depression, and falls Referrals and appointments  In addition, I have reviewed and discussed with patient certain preventive protocols, quality metrics, and best practice recommendations. A written personalized care plan for preventive services as well as general preventive health recommendations were provided to patient.     Octavia Heir, NP   10/26/2022   Nurse Notes: Allergy to pneumonia vaccine, refusing to have shingrix or Tdap at this time. MMSE 28/30, correct clock and sentence. Will order bone density since it has been 9 years since last study. H/o osteopenia.

## 2022-10-26 NOTE — Patient Instructions (Addendum)
  Laura Lee , Thank you for taking time to come for your Medicare Wellness Visit. I appreciate your ongoing commitment to your health goals. Please review the following plan we discussed and let me know if I can assist you in the future.   These are the goals we discussed:  Goals      Maintain Mobility and Function     Evidence-based guidance:  Emphasize the importance of physical activity and aerobic exercise as included in treatment plan; assess barriers to adherence; consider patient's abilities and preferences.  Encourage gradual increase in activity or exercise instead of stopping if pain occurs.  Reinforce individual therapy exercise prescription, such as strengthening, stabilization and stretching programs.  Promote optimal body mechanics to stabilize the spine with lifting and functional activity.  Encourage activity and mobility modifications to facilitate optimal function, such as using a log roll for bed mobility or dressing from a seated position.  Reinforce individual adaptive equipment recommendations to limit excessive spinal movements, such as a Event organiser.  Assess adequacy of sleep; encourage use of sleep hygiene techniques, such as bedtime routine; use of white noise; dark, cool bedroom; avoiding daytime naps, heavy meals or exercise before bedtime.  Promote positions and modification to optimize sleep and sexual activity; consider pillows or positioning devices to assist in maintaining neutral spine.  Explore options for applying ergonomic principles at work and home, such as frequent position changes, using ergonomically designed equipment and working at optimal height.  Promote modifications to increase comfort with driving such as lumbar support, optimizing seat and steering wheel position, using cruise control and taking frequent rest stops to stretch and walk.   Notes:         This is a list of the screening recommended for you and due dates:  Health  Maintenance  Topic Date Due   COVID-19 Vaccine (7 - 2023-24 season) 05/25/2022   Flu Shot  12/24/2022   Medicare Annual Wellness Visit  10/26/2023   DEXA scan (bone density measurement)  Completed   HPV Vaccine  Aged Out   DTaP/Tdap/Td vaccine  Discontinued   Pneumonia Vaccine  Discontinued   Zoster (Shingles) Vaccine  Discontinued   Call [facility] [phone] to schedule bone density.   Apply mupirocin every morning after showering and cover with bandage x 10 days. Remove bandage at night. Notify provider if sign of infection> redness, heat, pus, increased pain/swelling

## 2022-10-29 ENCOUNTER — Other Ambulatory Visit: Payer: Self-pay | Admitting: Cardiology

## 2022-10-29 DIAGNOSIS — I4819 Other persistent atrial fibrillation: Secondary | ICD-10-CM

## 2022-10-29 NOTE — Telephone Encounter (Signed)
Prescription refill request for Eliquis received. Indication: AF Last office visit: 09/24/22  Polly Cobia PA-C Scr: 1.12 on 09/07/22  Epic Age: 89 Weight: 64.7kg  Based on above findings Eliquis 5mg  twice daily is the appropriate dose.  Refill approved.

## 2022-11-02 ENCOUNTER — Other Ambulatory Visit: Payer: Self-pay | Admitting: Internal Medicine

## 2022-11-03 ENCOUNTER — Ambulatory Visit (HOSPITAL_BASED_OUTPATIENT_CLINIC_OR_DEPARTMENT_OTHER)
Admission: RE | Admit: 2022-11-03 | Discharge: 2022-11-03 | Disposition: A | Discharge status: Home / Self Care | Payer: Medicare Other | Source: Ambulatory Visit | Attending: Orthopedic Surgery | Admitting: Orthopedic Surgery

## 2022-11-03 DIAGNOSIS — Z780 Asymptomatic menopausal state: Secondary | ICD-10-CM | POA: Diagnosis not present

## 2022-11-03 DIAGNOSIS — E2839 Other primary ovarian failure: Secondary | ICD-10-CM | POA: Diagnosis not present

## 2022-11-03 DIAGNOSIS — M810 Age-related osteoporosis without current pathological fracture: Secondary | ICD-10-CM | POA: Diagnosis not present

## 2022-11-03 DIAGNOSIS — M8580 Other specified disorders of bone density and structure, unspecified site: Secondary | ICD-10-CM | POA: Diagnosis not present

## 2022-11-10 DIAGNOSIS — I872 Venous insufficiency (chronic) (peripheral): Secondary | ICD-10-CM | POA: Diagnosis not present

## 2022-11-10 DIAGNOSIS — L0889 Other specified local infections of the skin and subcutaneous tissue: Secondary | ICD-10-CM | POA: Diagnosis not present

## 2022-11-11 ENCOUNTER — Ambulatory Visit: Payer: Medicare Other | Attending: Student

## 2022-11-11 DIAGNOSIS — Z79899 Other long term (current) drug therapy: Secondary | ICD-10-CM

## 2022-11-11 DIAGNOSIS — E039 Hypothyroidism, unspecified: Secondary | ICD-10-CM | POA: Diagnosis not present

## 2022-11-11 LAB — T4, FREE: Free T4: 1.81 ng/dL — ABNORMAL HIGH (ref 0.82–1.77)

## 2022-11-11 LAB — TSH: TSH: 5.53 u[IU]/mL — ABNORMAL HIGH (ref 0.450–4.500)

## 2022-12-01 ENCOUNTER — Other Ambulatory Visit: Payer: Self-pay | Admitting: Physician Assistant

## 2022-12-01 ENCOUNTER — Other Ambulatory Visit: Payer: Self-pay | Admitting: Internal Medicine

## 2022-12-08 DIAGNOSIS — L0889 Other specified local infections of the skin and subcutaneous tissue: Secondary | ICD-10-CM | POA: Diagnosis not present

## 2022-12-11 ENCOUNTER — Other Ambulatory Visit: Payer: Self-pay

## 2022-12-11 MED ORDER — LEVOTHYROXINE SODIUM 100 MCG PO TABS: 100.0000 ug | ORAL_TABLET | Freq: Every day | ORAL | 1 refills | Status: DC

## 2022-12-28 ENCOUNTER — Other Ambulatory Visit: Payer: Self-pay | Admitting: Internal Medicine

## 2022-12-29 DIAGNOSIS — H401223 Low-tension glaucoma, left eye, severe stage: Secondary | ICD-10-CM | POA: Diagnosis not present

## 2023-01-04 ENCOUNTER — Other Ambulatory Visit: Payer: Self-pay | Admitting: Internal Medicine

## 2023-01-04 NOTE — Telephone Encounter (Signed)
Patient has request refill on medication Oxycodone. Patient medication last refilled 11/04/2022. Patient medication pend and sent to PCP Mahlon Gammon, MD for approval.

## 2023-01-21 DIAGNOSIS — L0889 Other specified local infections of the skin and subcutaneous tissue: Secondary | ICD-10-CM | POA: Diagnosis not present

## 2023-01-21 DIAGNOSIS — L821 Other seborrheic keratosis: Secondary | ICD-10-CM | POA: Diagnosis not present

## 2023-01-21 DIAGNOSIS — L309 Dermatitis, unspecified: Secondary | ICD-10-CM | POA: Diagnosis not present

## 2023-01-28 ENCOUNTER — Other Ambulatory Visit: Payer: Medicare Other

## 2023-02-01 ENCOUNTER — Other Ambulatory Visit: Payer: Self-pay | Admitting: Cardiology

## 2023-02-01 ENCOUNTER — Other Ambulatory Visit: Payer: Medicare Other

## 2023-02-01 DIAGNOSIS — E039 Hypothyroidism, unspecified: Secondary | ICD-10-CM | POA: Diagnosis not present

## 2023-02-02 LAB — TSH: TSH: 4.67 m[IU]/L — ABNORMAL HIGH (ref 0.40–4.50)

## 2023-02-03 ENCOUNTER — Non-Acute Institutional Stay: Payer: Medicare Other | Admitting: Internal Medicine

## 2023-02-03 ENCOUNTER — Encounter: Payer: Self-pay | Admitting: Internal Medicine

## 2023-02-03 VITALS — BP 118/72 | HR 66 | Temp 97.6°F | Resp 17 | Ht 66.0 in | Wt 140.4 lb

## 2023-02-03 DIAGNOSIS — I480 Paroxysmal atrial fibrillation: Secondary | ICD-10-CM | POA: Diagnosis not present

## 2023-02-03 DIAGNOSIS — M810 Age-related osteoporosis without current pathological fracture: Secondary | ICD-10-CM | POA: Insufficient documentation

## 2023-02-03 DIAGNOSIS — G250 Essential tremor: Secondary | ICD-10-CM

## 2023-02-03 DIAGNOSIS — G3184 Mild cognitive impairment, so stated: Secondary | ICD-10-CM | POA: Insufficient documentation

## 2023-02-03 DIAGNOSIS — I5022 Chronic systolic (congestive) heart failure: Secondary | ICD-10-CM | POA: Diagnosis not present

## 2023-02-03 DIAGNOSIS — R21 Rash and other nonspecific skin eruption: Secondary | ICD-10-CM | POA: Insufficient documentation

## 2023-02-03 DIAGNOSIS — N1831 Chronic kidney disease, stage 3a: Secondary | ICD-10-CM | POA: Diagnosis not present

## 2023-02-03 DIAGNOSIS — E039 Hypothyroidism, unspecified: Secondary | ICD-10-CM | POA: Diagnosis not present

## 2023-02-03 DIAGNOSIS — D492 Neoplasm of unspecified behavior of bone, soft tissue, and skin: Secondary | ICD-10-CM | POA: Diagnosis not present

## 2023-02-03 DIAGNOSIS — D696 Thrombocytopenia, unspecified: Secondary | ICD-10-CM

## 2023-02-03 DIAGNOSIS — H9193 Unspecified hearing loss, bilateral: Secondary | ICD-10-CM | POA: Diagnosis not present

## 2023-02-03 DIAGNOSIS — R600 Localized edema: Secondary | ICD-10-CM | POA: Diagnosis not present

## 2023-02-03 MED ORDER — ALENDRONATE SODIUM 70 MG PO TABS: 70.0000 mg | ORAL_TABLET | ORAL | 3 refills | Status: DC
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
## 2023-02-09 DIAGNOSIS — L309 Dermatitis, unspecified: Secondary | ICD-10-CM | POA: Diagnosis not present

## 2023-02-09 DIAGNOSIS — L820 Inflamed seborrheic keratosis: Secondary | ICD-10-CM | POA: Diagnosis not present

## 2023-02-09 DIAGNOSIS — L821 Other seborrheic keratosis: Secondary | ICD-10-CM | POA: Diagnosis not present

## 2023-02-15 ENCOUNTER — Encounter: Payer: Self-pay | Admitting: Orthopedic Surgery

## 2023-02-15 ENCOUNTER — Encounter: Payer: Medicare Other | Admitting: Orthopedic Surgery

## 2023-02-15 VITALS — BP 102/66 | HR 68 | Temp 97.1°F | Resp 16 | Ht 66.0 in | Wt 141.1 lb

## 2023-02-15 DIAGNOSIS — G8929 Other chronic pain: Secondary | ICD-10-CM | POA: Diagnosis not present

## 2023-02-15 DIAGNOSIS — M25561 Pain in right knee: Secondary | ICD-10-CM | POA: Diagnosis not present

## 2023-02-15 DIAGNOSIS — M810 Age-related osteoporosis without current pathological fracture: Secondary | ICD-10-CM

## 2023-02-15 NOTE — Patient Instructions (Signed)
Stop Foxamax  Continue supplement with calcium and vitamin D  Continue daily weight bearing exercise like walking  I will talk with Dr. Chales Abrahams about other treatments for osteoporosis  If right knee pain worsens please call to schedule follow up [phone]

## 2023-02-15 NOTE — Progress Notes (Unsigned)
Location[facility]  [facility]  Cl[facility] (12) Provider:  Octavia Heir, NP   Mahlon Gammon, MD  Patient Care Team: Mahlon Gammon, MD as PCP - General (Internal Medicine) Regan Lemming, MD as PCP - Cardiology (Cardiology) Regan Lemming, MD as PCP - Electrophysiology (Cardiology)  Extended Emergency Contact Information Primary Emergency Contact: [facility] Address: [address]          [place], [place] [address] D[place] of [place] Home Phone: [phone] Mobile Phone: [phone] Relation: Son  Code Status:  Full code Goals of care: Advanced Directive information    02/15/2023    1:17 PM  Advanced Directives  Does Patient Have a Medical Advance Directive? Yes  Type of Estate agent of [place];Living will;Out of facility DNR (pink MOST or yellow form)  Does patient want to make changes to medical advance directive? No - Patient declined  Copy of Healthcare Power of Attorney in Chart? Yes - validated most recent copy scanned in chart (See row information)     Chief Complaint  Patient presents with   Acute Visit    Patient complains of right knee pain since taking Fosamax.     HPI:  Pt is a 89 y.o. female seen today for acute visit due to right knee pain.      Past Medical History:  Diagnosis Date   Atrial fibrillation (HCC)    Breast CA (HCC)    Cancer (HCC)    Breast and thyroid cancer   Glaucoma    Hearing impaired    Hearing aids   Hypertension    Osteopenia 05/2013   T score -1.9 FRAX 15%/4.3%   Thyroid disease    Cancer   Past Surgical History:  Procedure Laterality Date   APPENDECTOMY     BREAST EXCISIONAL BIOPSY Right    benign   BREAST LUMPECTOMY     [id number] radiation   BREAST SURGERY  1989   Lumpectomy right breast   CARDIOVERSION N/A 02/14/2016   Procedure: CARDIOVERSION;  Surgeon: Quintella Reichert, MD;  Location: [facility] ENDOSCOPY;  Service: Cardiovascular;  Laterality: N/A;    CARDIOVERSION N/A 08/04/2018   Procedure: CARDIOVERSION;  Surgeon: Jodelle Red, MD;  Location: [facility] ENDOSCOPY;  Service: Cardiovascular;  Laterality: N/A;   CARDIOVERSION N/A 10/31/2019   Procedure: CARDIOVERSION;  Surgeon: Lars Masson, MD;  Location: [facility] ENDOSCOPY;  Service: Cardiovascular;  Laterality: N/A;   CARDIOVERSION N/A 05/02/2020   Procedure: CARDIOVERSION;  Surgeon: Parke Poisson, MD;  Location: [facility] ENDOSCOPY;  Service: Cardiovascular;  Laterality: N/A;   CARDIOVERSION N/A 09/10/2020   Procedure: CARDIOVERSION;  Surgeon: Christell Constant, MD;  Location: [facility] ENDOSCOPY;  Service: Cardiovascular;  Laterality: N/A;   Mole excised     Benign   THYROID SURGERY     Removed 1 lobe   TONSILLECTOMY      Allergies  Allergen Reactions   Combigan [Brimonidine Tartrate-Timolol] Itching and Other (See Comments)    Red eye    Pneumococcal Vaccines Other (See Comments)    Pain in arms/legs--nerve issues    Outpatient Encounter Medications as of 02/15/2023  Medication Sig   amiodarone (PACERONE) 200 MG tablet Take 1 tablet (200 mg total) by mouth daily.   apixaban (ELIQUIS) 5 MG TABS tablet TAKE 1 TABLET TWICE A DAY   beta carotene w/minerals (OCUVITE) tablet Take 1 tablet by mouth at bedtime.   Calcium Citrate-Vitamin D (CALCIUM CITRATE + D3 PO) Take 1 tablet  by mouth 2 (two) times daily.   carboxymethylcellulose (REFRESH PLUS) 0.5 % SOLN Place 1 drop into both eyes 3 (three) times daily as needed (dry/irritated eyes.).    cholecalciferol (VITAMIN D) 25 MCG (1000 UNIT) tablet Take 1,000 Units by mouth in the morning. chewables   diltiazem (CARDIZEM CD) 300 MG 24 hr capsule TAKE ONE CAPSULE BY MOUTH DAILY   Emollient (AQUAPHOR ADVANCED THERAPY) OINT Apply 1 applicator topically 2 (two) times daily.   furosemide (LASIX) 20 MG tablet TAKE TWO TABLETS BY MOUTH DAILY   glucosamine-chondroitin 500-400 MG tablet Take 1 tablet by mouth in the morning and at bedtime.    latanoprost (XALATAN) 0.005 % ophthalmic solution Place 1 drop into both eyes at bedtime.    levothyroxine (SYNTHROID) 100 MCG tablet Take 1 tablet (100 mcg total) by mouth daily before breakfast.   Multiple Vitamin (MULTIVITAMIN WITH MINERALS) TABS tablet Take 1 tablet by mouth daily with lunch. Adults 50+   oxyCODONE (OXY IR/ROXICODONE) 5 MG immediate release tablet Take 1 tablet (5 mg total) by mouth 2 (two) times daily.   Polyethylene Glycol 400 (BLINK TEARS) 0.25 % SOLN Place 1-2 drops into both eyes 3 (three) times daily as needed (dry/irritated eyes).   potassium chloride (KLOR-CON) 10 MEQ tablet TAKE TWO TABLETS BY MOUTH DAILY   sacubitril-valsartan (ENTRESTO) 24-26 MG TAKE 1 TABLET TWICE A DAY   triamcinolone ointment (KENALOG) 0.1 % Apply 1 Application topically daily.   alendronate (FOSAMAX) 70 MG tablet Take 1 tablet (70 mg total) by mouth every 7 (seven) days. Take with a full glass of water on an empty stomach. (Patient not taking: Reported on 02/15/2023)   No facility-administered encounter medications on file as of 02/15/2023.    Review of Systems  Immunization History  Administered Date(s) Administered   Influenza Split 03/18/2011, 03/03/2012, 03/07/2013, 04/03/2014, 03/29/2015, 02/22/2017   Influenza, High Dose Seasonal PF 03/04/2016, 03/19/2017, 02/17/2018, 02/27/2021   Influenza,inj,Quad PF,6+ Mos 03/24/2013   Influenza-Unspecified 03/04/2016, 03/26/2020   Moderna Covid-19 Vaccine Bivalent Booster 64yrs & up 11/05/2021   Moderna SARS-COV2 Booster Vaccination 10/30/2020   Moderna Sars-Covid-2 Vaccination 05/31/2019, 06/26/2019   PFIZER Comirnaty(Gray Top)Covid-19 Tri-Sucrose Vaccine 03/30/2022   Pneumococcal Polysaccharide-23 07/02/1999, 11/13/2019   Td 07/10/1998   Td (Adult) 07/10/1998   Tdap 11/30/2008   Unspecified SARS-COV-2 Vaccination 05/29/2019, 06/26/2019   Zoster, Live 11/30/2008   Pertinent  Health Maintenance Due  Topic Date Due   INFLUENZA VACCINE   12/24/2022   DEXA SCAN  Completed      09/16/2022   10:15 AM 10/26/2022    1:04 PM 10/26/2022    1:24 PM 02/03/2023    9:57 AM 02/15/2023    1:17 PM  Fall Risk  Falls in the past year? 0 0 0 0 0  Was there an injury with Fall? 0 0 0 0 0  Fall Risk Category Calculator 0 0 0 0 0  Patient at Risk for Falls Due to No Fall Risks No Fall Risks No Fall Risks No Fall Risks No Fall Risks  Fall risk Follow up Falls evaluation completed Falls evaluation completed Falls evaluation completed;Education provided;Falls prevention discussed Falls evaluation completed Falls evaluation completed;Education provided;Falls prevention discussed   Functional Status Survey:    Vitals:   02/15/23 1312  BP: 102/66  Pulse: 68  Resp: 16  Temp: (!) 97.1 F (36.2 C)  SpO2: 97%  Weight: 141 lb 1.6 oz (64 kg)  Height: 5\' 6"  (1.676 m)   Body mass index is 22.77  kg/m. Physical Exam  Labs reviewed: Recent Labs    02/19/22 0808 09/07/22 0820  NA 139 141  K 4.0 4.2  CL 100 102  CO2 31 30  GLUCOSE 59* 76  BUN 25 24  CREATININE 1.10* 1.12*  CALCIUM 8.9 8.8   Recent Labs    02/19/22 0808 09/07/22 0820  AST 23 15  ALT 17 13  BILITOT 0.6 0.5  PROT 7.3 7.0   Recent Labs    02/19/22 0808 03/16/22 0935 09/07/22 0820  WBC 6.9 6.4 6.6  NEUTROABS 3,726  --  3,637  HGB 13.2 13.3 13.0  HCT 39.8 40.3 40.2  MCV 88.6 88 88.7  PLT  --  CANCELED 68*   Lab Results  Component Value Date   TSH 4.67 (H) 02/01/2023   No results found for: "HGBA1C" Lab Results  Component Value Date   CHOL 190 09/07/2022   HDL 72 09/07/2022   LDLCALC 104 (H) 09/07/2022   TRIG 56 09/07/2022   CHOLHDL 2.6 09/07/2022    Significant Diagnostic Results in last 30 days:  No results found.  Assessment/Plan There are no diagnoses linked to this encounter.   Family/ staff Communication: ***  Labs/tests ordered:  ***

## 2023-02-18 ENCOUNTER — Telehealth: Payer: Medicare Other

## 2023-02-18 ENCOUNTER — Other Ambulatory Visit: Payer: Self-pay | Admitting: Orthopedic Surgery

## 2023-02-18 DIAGNOSIS — G8929 Other chronic pain: Secondary | ICD-10-CM

## 2023-02-18 MED ORDER — PREDNISONE 20 MG PO TABS
ORAL_TABLET | ORAL | 0 refills | Status: AC
Start: 2023-02-18 — End: 2023-02-25

## 2023-02-18 NOTE — Telephone Encounter (Signed)
Treatment options discussed via telephone with patient. We will try prednisone taper. Prescription sent to [facility].

## 2023-02-18 NOTE — Telephone Encounter (Signed)
Patient states she took Fosamax then started having some knee pain. Patient states she had an appointment on 02/15/2023 and told it was a side effect to the medication possibly , but she states that it has gotten extremely worse , and wants to know what to do next or can she get a shot for her knee. Please advise

## 2023-02-18 NOTE — Progress Notes (Signed)
09/23 she was seen for increased right knee pain after starting Fosamax 09/14. Right knee continues to bother her. She has stopped taking Fosamax. No recent falls. Afebrile. Will try tapered dose of Prednisone for pain. Advised to continue tylenol prn.

## 2023-02-22 ENCOUNTER — Other Ambulatory Visit: Payer: Self-pay | Admitting: Internal Medicine

## 2023-02-22 NOTE — Telephone Encounter (Signed)
Patient has request refill on medication Oxycodone. Patient medication last refilled 01/06/2023. Patient medication pend and sent to PCP Mahlon Gammon, MD for approval.

## 2023-03-01 ENCOUNTER — Ambulatory Visit
Admission: RE | Admit: 2023-03-01 | Discharge: 2023-03-01 | Disposition: A | Discharge status: Home / Self Care | Payer: Medicare Other | Source: Ambulatory Visit | Attending: Adult Health | Admitting: Adult Health

## 2023-03-01 ENCOUNTER — Ambulatory Visit (INDEPENDENT_AMBULATORY_CARE_PROVIDER_SITE_OTHER): Payer: Medicare Other | Admitting: Adult Health

## 2023-03-01 ENCOUNTER — Encounter: Payer: Self-pay | Admitting: Adult Health

## 2023-03-01 VITALS — BP 122/78 | HR 69 | Temp 98.0°F | Resp 18 | Ht 66.0 in | Wt 140.0 lb

## 2023-03-01 DIAGNOSIS — G8929 Other chronic pain: Secondary | ICD-10-CM

## 2023-03-01 DIAGNOSIS — M25561 Pain in right knee: Secondary | ICD-10-CM | POA: Diagnosis not present

## 2023-03-01 DIAGNOSIS — M1711 Unilateral primary osteoarthritis, right knee: Secondary | ICD-10-CM | POA: Diagnosis not present

## 2023-03-01 NOTE — Progress Notes (Unsigned)
[facility] clinic  Provider: Kenard Gower DNP  Code Status:  DNR  Goals of Care:     02/15/2023    1:17 PM  Advanced Directives  Does Patient Have a Medical Advance Directive? Yes  Type of Estate agent of [place];Living will;Out of facility DNR (pink MOST or yellow form)  Does patient want to make changes to medical advance directive? No - Patient declined  Copy of Healthcare Power of Attorney in Chart? Yes - validated most recent copy scanned in chart (See row information)     Chief Complaint  Patient presents with   Acute Visit    Knee pain     HPI: Patient is a 89 y.o. female seen today for an acute visit for right knee pain. She was accompanied today by her son. Patient has just completed Prednisone X 1 week. Patient stated that her right knee pain is progressively getting worse. She uses Magnesium base (Mg12) ointment, aspercreme and knee brace. She takes Oxycodone 5 mg BID. Son stated that patient fell in 2015 and ever since then she started having on and off right knee pain.   Offered additional Prednisone but stated that it doesn't really help. Right knee is warm to touch.  Past Medical History:  Diagnosis Date   Atrial fibrillation (HCC)    Breast CA (HCC)    Cancer (HCC)    Breast and thyroid cancer   Glaucoma    Hearing impaired    Hearing aids   Hypertension    Osteopenia 05/2013   T score -1.9 FRAX 15%/4.3%   Thyroid disease    Cancer    Past Surgical History:  Procedure Laterality Date   APPENDECTOMY     BREAST EXCISIONAL BIOPSY Right    benign   BREAST LUMPECTOMY     [id number] radiation   BREAST SURGERY  1989   Lumpectomy right breast   CARDIOVERSION N/A 02/14/2016   Procedure: CARDIOVERSION;  Surgeon: Quintella Reichert, MD;  [facility]: [facility] ENDOSCOPY;  Service: Cardiovascular;  Laterality: N/A;   CARDIOVERSION N/A 08/04/2018   Procedure: CARDIOVERSION;  Surgeon: Jodelle Red, MD;  Location: [facility] ENDOSCOPY;  Service:  Cardiovascular;  Laterality: N/A;   CARDIOVERSION N/A 10/31/2019   Procedure: CARDIOVERSION;  Surgeon: Lars Masson, MD;  Location: [facility] ENDOSCOPY;  Service: Cardiovascular;  Laterality: N/A;   CARDIOVERSION N/A 05/02/2020   Procedure: CARDIOVERSION;  Surgeon: Parke Poisson, MD;  Location: [facility] ENDOSCOPY;  Service: Cardiovascular;  Laterality: N/A;   CARDIOVERSION N/A 09/10/2020   Procedure: CARDIOVERSION;  Surgeon: Christell Constant, MD;  Location: [facility] ENDOSCOPY;  Service: Cardiovascular;  Laterality: N/A;   Mole excised     Benign   THYROID SURGERY     Removed 1 lobe   TONSILLECTOMY      Allergies  Allergen Reactions   Combigan [Brimonidine Tartrate-Timolol] Itching and Other (See Comments)    Red eye    Pneumococcal Vaccines Other (See Comments)    Pain in arms/legs--nerve issues    Outpatient Encounter Medications as of 03/01/2023  Medication Sig   amiodarone (PACERONE) 200 MG tablet Take 1 tablet (200 mg total) by mouth daily.   apixaban (ELIQUIS) 5 MG TABS tablet TAKE 1 TABLET TWICE A DAY   beta carotene w/minerals (OCUVITE) tablet Take 1 tablet by mouth at bedtime.   Calcium Citrate-Vitamin D (CALCIUM CITRATE + D3 PO) Take 1 tablet by mouth 2 (two) times daily.   carboxymethylcellulose (REFRESH PLUS) 0.5 % SOLN Place 1 drop into both  eyes 3 (three) times daily as needed (dry/irritated eyes.).    cholecalciferol (VITAMIN D) 25 MCG (1000 UNIT) tablet Take 1,000 Units by mouth in the morning. chewables   diltiazem (CARDIZEM CD) 300 MG 24 hr capsule TAKE ONE CAPSULE BY MOUTH DAILY   Emollient (AQUAPHOR ADVANCED THERAPY) OINT Apply 1 applicator topically 2 (two) times daily.   furosemide (LASIX) 20 MG tablet TAKE TWO TABLETS BY MOUTH DAILY   glucosamine-chondroitin 500-400 MG tablet Take 1 tablet by mouth in the morning and at bedtime.   latanoprost (XALATAN) 0.005 % ophthalmic solution Place 1 drop into both eyes at bedtime.    levothyroxine (SYNTHROID) 100 MCG tablet  Take 1 tablet (100 mcg total) by mouth daily before breakfast.   Multiple Vitamin (MULTIVITAMIN WITH MINERALS) TABS tablet Take 1 tablet by mouth daily with lunch. Adults 50+   oxyCODONE (OXY IR/ROXICODONE) 5 MG immediate release tablet TAKE ONE TABLET BY MOUTH TWICE DAILY   Polyethylene Glycol 400 (BLINK TEARS) 0.25 % SOLN Place 1-2 drops into both eyes 3 (three) times daily as needed (dry/irritated eyes).   potassium chloride (KLOR-CON) 10 MEQ tablet TAKE TWO TABLETS BY MOUTH DAILY   sacubitril-valsartan (ENTRESTO) 24-26 MG TAKE 1 TABLET TWICE A DAY   triamcinolone ointment (KENALOG) 0.1 % Apply 1 Application topically daily.   No facility-administered encounter medications on file as of 03/01/2023.    Review of Systems:  Review of Systems  Constitutional:  Negative for appetite change, chills, fatigue and fever.  HENT:  Negative for congestion, hearing loss, rhinorrhea and sore throat.   Eyes: Negative.   Respiratory:  Negative for cough, shortness of breath and wheezing.   Cardiovascular:  Negative for chest pain, palpitations and leg swelling.  Gastrointestinal:  Negative for abdominal pain, constipation, diarrhea, nausea and vomiting.  Genitourinary:  Negative for dysuria.  Musculoskeletal:  Negative for arthralgias, back pain and myalgias.       Knee pain  Skin:  Negative for color change, rash and wound.  Neurological:  Negative for dizziness, weakness and headaches.  Psychiatric/Behavioral:  Negative for behavioral problems. The patient is not nervous/anxious.     Health Maintenance  Topic Date Due   INFLUENZA VACCINE  12/24/2022   COVID-19 Vaccine (7 - 2023-24 season) 01/24/2023   Medicare Annual Wellness (AWV)  10/26/2023   DEXA SCAN  Completed   HPV VACCINES  Aged Out   DTaP/Tdap/Td  Discontinued   Pneumonia Vaccine 65+ Years old  Discontinued   Zoster Vaccines- Shingrix  Discontinued    Physical Exam: Vitals:   03/01/23 1125  Height: 5\' 6"  (1.676 m)   Body mass  index is 22.77 kg/m. Physical Exam Constitutional:      Appearance: Normal appearance.  HENT:     Head: Normocephalic and atraumatic.     Nose: Nose normal.     Mouth/Throat:     Mouth: Mucous membranes are moist.  Eyes:     Conjunctiva/sclera: Conjunctivae normal.  Cardiovascular:     Rate and Rhythm: Normal rate and regular rhythm.  Pulmonary:     Effort: Pulmonary effort is normal.     Breath sounds: Normal breath sounds.  Abdominal:     General: Bowel sounds are normal.     Palpations: Abdomen is soft.  Musculoskeletal:     Cervical back: Normal range of motion.     Comments: Right knee is warm and tender to touch.  Skin:    General: Skin is warm and dry.  Neurological:  General: No focal deficit present.     Mental Status: She is alert and oriented to person, place, and time.  Psychiatric:        Mood and Affect: Mood normal.        Behavior: Behavior normal.        Thought Content: Thought content normal.        Judgment: Judgment normal.     Labs reviewed: Basic Metabolic Panel: Recent Labs    09/07/22 0820 11/11/22 0835 02/01/23 0810  NA 141  --   --   K 4.2  --   --   CL 102  --   --   CO2 30  --   --   GLUCOSE 76  --   --   BUN 24  --   --   CREATININE 1.12*  --   --   CALCIUM 8.8  --   --   TSH 5.38* 5.530* 4.67*   Liver Function Tests: Recent Labs    09/07/22 0820  AST 15  ALT 13  BILITOT 0.5  PROT 7.0   No results for input(s): "LIPASE", "AMYLASE" in the last 8760 hours. No results for input(s): "AMMONIA" in the last 8760 hours. CBC: Recent Labs    03/16/22 0935 09/07/22 0820  WBC 6.4 6.6  NEUTROABS  --  3,637  HGB 13.3 13.0  HCT 40.3 40.2  MCV 88 88.7  PLT CANCELED 68*   Lipid Panel: Recent Labs    09/07/22 0820  CHOL 190  HDL 72  LDLCALC 104*  TRIG 56  CHOLHDL 2.6   No results found for: "HGBA1C"  Procedures since last visit: No results found.  Assessment/Plan  1. Chronic pain of right knee -  declined  additional Prednisone -  continue Oxycodone IR 5 mg BID -  continue Asprecreme, Mag12 ointment and knee brace - DG Knee Complete 4 Views Right; Future - Ambulatory referral to Orthopedic Surgery   Labs/tests ordered:  x-ray of right knee  Next appt:  06/03/2023

## 2023-03-04 ENCOUNTER — Ambulatory Visit: Payer: Medicare Other | Admitting: Orthopaedic Surgery

## 2023-03-04 DIAGNOSIS — M1711 Unilateral primary osteoarthritis, right knee: Secondary | ICD-10-CM

## 2023-03-04 MED ORDER — BUPIVACAINE HCL 0.5 % IJ SOLN
2.0000 mL | INTRAMUSCULAR | Status: AC | PRN
Start: 2023-03-04 — End: 2023-03-04
  Administered 2023-03-04: 2 mL via INTRA_ARTICULAR

## 2023-03-04 MED ORDER — METHYLPREDNISOLONE ACETATE 40 MG/ML IJ SUSP
40.0000 mg | INTRAMUSCULAR | Status: AC | PRN
Start: 2023-03-04 — End: 2023-03-04
  Administered 2023-03-04: 40 mg via INTRA_ARTICULAR

## 2023-03-04 MED ORDER — LIDOCAINE HCL 1 % IJ SOLN
2.0000 mL | INTRAMUSCULAR | Status: AC | PRN
Start: 2023-03-04 — End: 2023-03-04
  Administered 2023-03-04: 2 mL

## 2023-03-04 NOTE — Progress Notes (Signed)
Office Visit Note   Patient: Laura Lee           Date of Birth: 1931/06/09           MRN: [id number] Visit Date: 03/04/2023              Requested by: Gillis Santa, NP [address] [place],  [place] [address] PCP: Mahlon Gammon, MD   Assessment & Plan: Visit Diagnoses:  1. Primary osteoarthritis of right knee     Plan: Patient is a very pleasant 89 year old female with end-stage valgus DJD.  Knee effusion is present.  Based on treatment options we elected to do aspiration and cortisone injection today.  25 cc of blood-tinged effusion was aspirated.  Prescription for cane.  Continue with knee brace during activity.  She will follow-up if her symptoms do not improve.  She tolerated the procedure well.  Follow-Up Instructions: No follow-ups on file.   Orders:  No orders of the defined types were placed in this encounter.  No orders of the defined types were placed in this encounter.     Procedures: Large Joint Inj: R knee on 03/04/2023 8:44 AM Indications: pain Details: 22 G needle  Arthrogram: No  Medications: 40 mg methylPREDNISolone acetate 40 MG/ML; 2 mL lidocaine 1 %; 2 mL bupivacaine 0.5 % Consent was given by the patient. Patient was prepped and draped in the usual sterile fashion.       Clinical Data: No additional findings.   Subjective: Chief Complaint  Patient presents with   Right Knee - Pain    HPI Patient is a very pleasant 89 year old female whose had worsening right knee pain since September.  [facility].  Denies any previous injuries or surgeries to the right knee.  States that it started hurting after she took a dose of Fosamax but since then she has not taken any more. Review of Systems  Constitutional: Negative.   HENT: Negative.    Eyes: Negative.   Respiratory: Negative.    Cardiovascular: Negative.   Endocrine: Negative.   Musculoskeletal: Negative.   Neurological: Negative.    Hematological: Negative.   Psychiatric/Behavioral: Negative.    All other systems reviewed and are negative.    Objective: Vital Signs: There were no vitals taken for this visit.  Physical Exam Vitals and nursing note reviewed.  Constitutional:      Appearance: She is well-developed.  HENT:     Head: Atraumatic.     Nose: Nose normal.  Eyes:     Extraocular Movements: Extraocular movements intact.  Cardiovascular:     Pulses: Normal pulses.  Pulmonary:     Effort: Pulmonary effort is normal.  Abdominal:     Palpations: Abdomen is soft.  Musculoskeletal:     Cervical back: Neck supple.  Skin:    General: Skin is warm.     Capillary Refill: Capillary refill takes less than 2 seconds.  Neurological:     Mental Status: She is alert. Mental status is at baseline.  Psychiatric:        Behavior: Behavior normal.        Thought Content: Thought content normal.        Judgment: Judgment normal.     Ortho Exam Exam of the right knee shows valgus deformity.  Lateral joint line tenderness.  Pain and crepitus with range of motion.  Large joint effusion present. Specialty Comments:  No specialty comments available.  Imaging: No  results found.   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   Bilateral edema of lower extremity 02/03/2023   Age-related osteoporosis without current pathological fracture 02/03/2023   Rash and nonspecific skin eruption 02/03/2023   Mild cognitive impairment 02/03/2023   Bilateral primary osteoarthritis of knee 09/16/2022   Bilateral hearing loss 09/16/2022   Glaucoma 09/16/2022   Other thrombophilia (HCC) 09/16/2022   Primary insomnia 09/16/2022   Sciatica 09/16/2022   Thrombocytopenia (HCC) 09/16/2022   Venous insufficiency 09/16/2022   Cellulitis 12/24/2020   CKD (chronic kidney disease) stage 3, GFR 30-59 ml/min (HCC) 08/20/2020   Folliculitis 01/15/2020   Slow transit constipation  12/19/2019   Dysphagia 12/19/2019   Hypothyroidism 12/19/2019   C2 cervical fracture (HCC) 10/26/2019   A-fib (HCC) 10/26/2019   Hypokalemia 10/26/2019   Paroxysmal atrial fibrillation (HCC)    Essential hypertension 05/26/2016   CHF (congestive heart failure) (HCC) 02/04/2016   Persistent atrial fibrillation (HCC) 02/04/2016   Past Medical History:  Diagnosis Date   Atrial fibrillation (HCC)    Breast CA (HCC)    Cancer (HCC)    Breast and thyroid cancer   Glaucoma    Hearing impaired    Hearing aids   Hypertension    Osteopenia 05/2013   T score -1.9 FRAX 15%/4.3%   Thyroid disease    Cancer    Family History  Problem Relation Age of Onset   Breast cancer Mother 69   Lung cancer Sister    Heart attack Brother    Breast cancer Daughter 64    Past Surgical History:  Procedure Laterality Date   APPENDECTOMY     BREAST EXCISIONAL BIOPSY Right    benign   BREAST LUMPECTOMY     [id number] radiation   BREAST SURGERY  1989   Lumpectomy right breast   CARDIOVERSION N/A 02/14/2016   Procedure: CARDIOVERSION;  Surgeon: Quintella Reichert, MD;  Location: [facility] ENDOSCOPY;  Service: Cardiovascular;  Laterality: N/A;   CARDIOVERSION N/A 08/04/2018   Procedure: CARDIOVERSION;  Surgeon: Jodelle Red, MD;  Location: [facility] ENDOSCOPY;  Service: Cardiovascular;  Laterality: N/A;   CARDIOVERSION N/A 10/31/2019   Procedure: CARDIOVERSION;  Surgeon: Lars Masson, MD;  Location: [facility] ENDOSCOPY;  Service: Cardiovascular;  Laterality: N/A;   CARDIOVERSION N/A 05/02/2020   Procedure: CARDIOVERSION;  Surgeon: Parke Poisson, MD;  Location: [facility] ENDOSCOPY;  Service: Cardiovascular;  Laterality: N/A;   CARDIOVERSION N/A 09/10/2020   Procedure: CARDIOVERSION;  Surgeon: Christell Constant, MD;  Location: [facility] ENDOSCOPY;  Service: Cardiovascular;  Laterality: N/A;   Mole excised     Benign   THYROID SURGERY     Removed 1 lobe   TONSILLECTOMY     Social History   Occupational History    Occupation: retired  Tobacco Use   Smoking status: Never   Smokeless tobacco: Never  Vaping Use   Vaping status: Never Used  Substance and Sexual Activity   Alcohol use: Not Currently   Drug use: No   Sexual activity: Never    Comment: 1st intercourse 53 yo-1 partner

## 2023-03-04 NOTE — Addendum Note (Signed)
Addended by: Wendi Maya on: 03/04/2023 09:03 AM   Modules accepted: Orders

## 2023-03-05 LAB — SYNOVIAL FLUID ANALYSIS, COMPLETE
Basophils, %: 0 %
Eosinophils-Synovial: 0 % (ref 0–2)
Lymphocytes-Synovial Fld: 6 % (ref 0–74)
Monocyte/Macrophage: 19 % (ref 0–69)
Neutrophil, Synovial: 75 % — ABNORMAL HIGH (ref 0–24)
Synoviocytes, %: 0 % (ref 0–15)
WBC, Synovial: 1257 {cells}/uL — ABNORMAL HIGH (ref <150)

## 2023-03-09 DIAGNOSIS — L308 Other specified dermatitis: Secondary | ICD-10-CM | POA: Diagnosis not present

## 2023-03-09 DIAGNOSIS — L720 Epidermal cyst: Secondary | ICD-10-CM | POA: Diagnosis not present

## 2023-03-09 DIAGNOSIS — L814 Other melanin hyperpigmentation: Secondary | ICD-10-CM | POA: Diagnosis not present

## 2023-03-09 DIAGNOSIS — R21 Rash and other nonspecific skin eruption: Secondary | ICD-10-CM | POA: Diagnosis not present

## 2023-03-09 DIAGNOSIS — L821 Other seborrheic keratosis: Secondary | ICD-10-CM | POA: Diagnosis not present

## 2023-03-23 DIAGNOSIS — Z23 Encounter for immunization: Secondary | ICD-10-CM | POA: Diagnosis not present

## 2023-03-24 NOTE — Progress Notes (Unsigned)
Electrophysiology Office Note:   Date:  03/25/2023  ID:  ISSYS AROMANDO, DOB 1931-12-22, MRN [id number]  Primary Cardiologist: Will Jorja Loa, MD Electrophysiologist: Regan Lemming, MD      History of Present Illness:   Laura Lee is a 89 y.o. female with h/o persistent AF on amiodarone, chronic diastolic CHF, HTN, and deconditioning  seen today for routine electrophysiology followup.   Since last being seen in our clinic the patient reports doing OK from a cardiac perspective. Has been having a lot of trouble with her R knee. Had effusion that needed to be drained, X rays, cortisone shot, and prednisone course. Had improved but now trending worse again. Otherwise, she denies chest pain, palpitations, dyspnea, PND, orthopnea, nausea, vomiting, dizziness, syncope, edema, weight gain, or early satiety.   Review of systems complete and found to be negative unless listed in HPI.   EP Information / Studies Reviewed:    EKG is ordered today. Personal review as below.  EKG Interpretation Date/Time:  Thursday March 25 2023 08:45:55 EDT Ventricular Rate:  67 PR Interval:  232 QRS Duration:  124 QT Interval:  438 QTC Calculation: 462 R Axis:   -73  Text Interpretation: Sinus rhythm with 1st degree A-V block Left axis deviation Confirmed by Maxine Glenn [phone]) on 03/25/2023 8:53:45 AM     Physical Exam:   VS:  BP 110/66   Pulse 67   Ht 5\' 6"  (1.676 m)   Wt 140 lb 9.6 oz (63.8 kg)   SpO2 95%   BMI 22.69 kg/m    Wt Readings from Last 3 Encounters:  03/25/23 140 lb 9.6 oz (63.8 kg)  03/01/23 140 lb (63.5 kg)  02/15/23 141 lb 1.6 oz (64 kg)     GEN: Well nourished, well developed in no acute distress NECK: No JVD; No carotid bruits CARDIAC: Regular rate and rhythm, no murmurs, rubs, gallops RESPIRATORY:  Clear to auscultation without rales, wheezing or rhonchi  ABDOMEN: Soft, non-tender, non-distended EXTREMITIES:  No edema; No deformity   ASSESSMENT AND  PLAN:    Persistent AF EKG today shows NSR  Continue eliquis for CHA2DS2VASc of at least 5.  Continue amiodarone 200 mg Monday through Friday.  Continue diltiazem Surveillance labs today.     Chronic diastolic CHF Felt secondary to AF  15-20% in 2017 >> 55% by echo in 2018 >> 40-45% in 2022 Continue Entresto BB stopped last historically with fatigue (noted bradycardia as well) Volume status stable on exam. R>L edema in setting of acute knee process.  Labs today.    HTN Stable on current regimen    Hypothyroid TFTs today     Follow up with Dr. Elberta Fortis in 6 months  Signed, Graciella Freer, PA-C

## 2023-03-25 ENCOUNTER — Ambulatory Visit: Payer: Medicare Other | Attending: Student | Admitting: Student

## 2023-03-25 ENCOUNTER — Ambulatory Visit: Payer: Medicare Other | Admitting: Student

## 2023-03-25 ENCOUNTER — Encounter: Payer: Self-pay | Admitting: Student

## 2023-03-25 VITALS — BP 110/66 | HR 67 | Ht 66.0 in | Wt 140.6 lb

## 2023-03-25 DIAGNOSIS — I480 Paroxysmal atrial fibrillation: Secondary | ICD-10-CM | POA: Insufficient documentation

## 2023-03-25 DIAGNOSIS — E039 Hypothyroidism, unspecified: Secondary | ICD-10-CM | POA: Diagnosis not present

## 2023-03-25 DIAGNOSIS — I10 Essential (primary) hypertension: Secondary | ICD-10-CM | POA: Insufficient documentation

## 2023-03-25 DIAGNOSIS — Z79899 Other long term (current) drug therapy: Secondary | ICD-10-CM | POA: Insufficient documentation

## 2023-03-25 NOTE — Patient Instructions (Signed)
Medication Instructions:  Your physician recommends that you continue on your current medications as directed. Please refer to the Current Medication list given to you today.  *If you need a refill on your cardiac medications before your next appointment, please call your pharmacy*  Lab Work: CMET, CBC, TSH, FreeT4-Today If you have labs (blood work) drawn today and your tests are completely normal, you will receive your results only by: MyChart Message (if you have MyChart) OR A paper copy in the mail If you have any lab test that is abnormal or we need to change your treatment, we will call you to review the results.  Follow-Up: At [facility], you and your health needs are our priority.  As part of our continuing mission to provide you with exceptional heart care, we have created designated Provider Care Teams.  These Care Teams include your primary Cardiologist (physician) and Advanced Practice Providers (APPs -  Physician Assistants and Nurse Practitioners) who all work together to provide you with the care you need, when you need it.  Your next appointment:   6 month(s)  Provider:   Casimiro Needle "Otilio Saber, PA-C

## 2023-03-26 LAB — COMPREHENSIVE METABOLIC PANEL WITH GFR
ALT: 22 IU/L (ref 0–32)
AST: 26 IU/L (ref 0–40)
Albumin: 3.6 g/dL — ABNORMAL LOW (ref 3.7–4.7)
Alkaline Phosphatase: 77 IU/L (ref 44–121)
BUN/Creatinine Ratio: 23 (ref 12–28)
BUN: 24 mg/dL (ref 8–27)
Bilirubin Total: 0.3 mg/dL (ref 0.0–1.2)
CO2: 29 mmol/L (ref 20–29)
Calcium: 8.5 mg/dL — ABNORMAL LOW (ref 8.7–10.3)
Chloride: 103 mmol/L (ref 96–106)
Creatinine, Ser: 1.06 mg/dL — ABNORMAL HIGH (ref 0.57–1.00)
Globulin, Total: 2.8 g/dL (ref 1.5–4.5)
Glucose: 83 mg/dL (ref 70–99)
Potassium: 4.1 mmol/L (ref 3.5–5.2)
Sodium: 140 mmol/L (ref 134–144)
Total Protein: 6.4 g/dL (ref 6.0–8.5)
eGFR: 51 mL/min/1.73 — ABNORMAL LOW

## 2023-03-26 LAB — CBC
Hematocrit: 36.8 % (ref 34.0–46.6)
Hemoglobin: 11.8 g/dL (ref 11.1–15.9)
MCH: 28.9 pg (ref 26.6–33.0)
MCHC: 32.1 g/dL (ref 31.5–35.7)
MCV: 90 fL (ref 79–97)
RBC: 4.08 x10E6/uL (ref 3.77–5.28)
RDW: 14 % (ref 11.7–15.4)
WBC: 5.9 10*3/uL (ref 3.4–10.8)

## 2023-03-26 LAB — TSH: TSH: 3.66 u[IU]/mL (ref 0.450–4.500)

## 2023-03-26 LAB — T4, FREE: Free T4: 1.86 ng/dL — ABNORMAL HIGH (ref 0.82–1.77)

## 2023-03-26 NOTE — Progress Notes (Signed)
-    Right knee has degenerative changes with large pleural effusion, has been seen by orthopedics

## 2023-04-02 DIAGNOSIS — H401223 Low-tension glaucoma, left eye, severe stage: Secondary | ICD-10-CM | POA: Diagnosis not present

## 2023-04-13 DIAGNOSIS — R21 Rash and other nonspecific skin eruption: Secondary | ICD-10-CM | POA: Diagnosis not present

## 2023-04-26 ENCOUNTER — Other Ambulatory Visit: Payer: Self-pay | Admitting: Internal Medicine

## 2023-04-26 NOTE — Telephone Encounter (Signed)
Patient is requesting a refill of the following medications: Requested Prescriptions   Pending Prescriptions Disp Refills   oxyCODONE (OXY IR/ROXICODONE) 5 MG immediate release tablet [Pharmacy Med Name: oxycodone 5 mg tablet] 60 tablet 0    Sig: TAKE ONE TABLET BY MOUTH TWICE DAILY    Date of last refill: 02/22/23  Refill amount: 0  Treatment agreement date: 10/29/2021

## 2023-05-03 ENCOUNTER — Other Ambulatory Visit: Payer: Self-pay | Admitting: Cardiology

## 2023-05-03 DIAGNOSIS — I4819 Other persistent atrial fibrillation: Secondary | ICD-10-CM

## 2023-05-03 NOTE — Telephone Encounter (Signed)
Eliquis 5mg  refill request received. Patient is 89 years old, weight-63.8kg, Crea-1.06 on 03/25/23, Diagnosis-Afib, and last seen by Otilio Saber on 03/25/23. Dose is appropriate based on dosing criteria. Will send in refill to requested pharmacy.

## 2023-05-04 ENCOUNTER — Other Ambulatory Visit (HOSPITAL_COMMUNITY): Payer: Self-pay

## 2023-05-04 ENCOUNTER — Telehealth: Payer: Self-pay | Admitting: Cardiology

## 2023-05-04 ENCOUNTER — Other Ambulatory Visit: Payer: Self-pay | Admitting: Cardiology

## 2023-05-04 ENCOUNTER — Other Ambulatory Visit: Payer: Self-pay | Admitting: Student

## 2023-05-04 DIAGNOSIS — I4819 Other persistent atrial fibrillation: Secondary | ICD-10-CM

## 2023-05-04 MED ORDER — ENTRESTO 24-26 MG PO TABS
1.0000 | ORAL_TABLET | Freq: Two times a day (BID) | ORAL | 3 refills | Status: DC
  Filled 2023-05-04 (×2): qty 180, 90d supply, fill #0
  Filled 2023-07-12: qty 180, 90d supply, fill #1
  Filled 2023-11-08: qty 180, 90d supply, fill #2
  Filled 2024-02-07: qty 180, 90d supply, fill #3

## 2023-05-04 MED ORDER — APIXABAN 5 MG PO TABS
5.0000 mg | ORAL_TABLET | Freq: Two times a day (BID) | ORAL | 1 refills | Status: DC
Start: 2023-05-04 — End: 2023-09-06
  Filled 2023-05-04 – 2023-07-12 (×4): qty 180, 90d supply, fill #0

## 2023-05-04 NOTE — Telephone Encounter (Signed)
Eliquis 5mg  refill request received. Patient is 89 years old, weight-63.8kg, Crea-1.06 on 03/25/23, Diagnosis-Afib, and last seen by Otilio Saber on 03/25/23. Dose is appropriate based on dosing criteria. Will send in refill to requested pharmacy.

## 2023-05-04 NOTE — Telephone Encounter (Signed)
*  STAT* If patient is at the pharmacy, call can be transferred to refill team.   1. Which medications need to be refilled? (please list name of each medication and dose if known)  apixaban (ELIQUIS) 5 MG TABS tablet  2. Which pharmacy/location (including street and city if local pharmacy) is medication to be sent to?[facility] Phone: [phone]  Fax: [phone]    3. Do they need a 30 day or 90 day supply? 90 Per [facility] they are not going to carry Eliquis anymore so pt wants it sent to [facility] to be mailed. If you have any questions please call Onalee Hua (Son) at [phone]

## 2023-05-04 NOTE — Telephone Encounter (Signed)
*  STAT* If patient is at the pharmacy, call can be transferred to refill team.   1. Which medications need to be refilled? (please list name of each medication and dose if known) sacubitril-valsartan (ENTRESTO) 24-26 MG    2. Would you like to learn more about the convenience, safety, & potential cost savings by using the [facility]? Yes      3. Are you open to using the [facility] (Type [facility]. Yes  ).   4. Which pharmacy/location (including street and city if local pharmacy) is medication to be sent to? [facility]    5. Do they need a 30 day or 90 day supply? 90

## 2023-05-04 NOTE — Telephone Encounter (Signed)
This was refilled yesterday I believe but, we need to send it to a different Pharmacy

## 2023-05-04 NOTE — Telephone Encounter (Signed)
Spoke with [facility] and they states they have received the eliquis prescription but unable to fill since [facility] has it in their que to be processed and it needs to be removed on their end. She advised to have it have it removed.  Spoke with son, Laura Lee, and gave update and advised that since there was a contract set up with [employer] and [employer] that [facility] he has been in contact with needs to call and have them take it off so Laura Lee can fill it. He will call Heather at [facility] at this time.

## 2023-05-05 ENCOUNTER — Other Ambulatory Visit (HOSPITAL_COMMUNITY): Payer: Self-pay

## 2023-05-05 ENCOUNTER — Other Ambulatory Visit: Payer: Self-pay

## 2023-05-06 ENCOUNTER — Other Ambulatory Visit (HOSPITAL_COMMUNITY): Payer: Self-pay

## 2023-05-10 ENCOUNTER — Telehealth: Payer: Self-pay | Admitting: *Deleted

## 2023-05-10 NOTE — Telephone Encounter (Signed)
Patient called and stated that she saw Dr. Governor Specking for her Knee pain and swelling and he drew off fluid and gave her a Cortisone injection. Patient stated that she is having the same pain and swollen and wants to know if it was ok for her to call his office to be seen again.   I told her to call his office to schedule an appointment for evaluation since he has already seen her for this. She agreed and will call.   FYI

## 2023-05-11 DIAGNOSIS — I872 Venous insufficiency (chronic) (peripheral): Secondary | ICD-10-CM | POA: Diagnosis not present

## 2023-05-11 DIAGNOSIS — R21 Rash and other nonspecific skin eruption: Secondary | ICD-10-CM | POA: Diagnosis not present

## 2023-05-24 DIAGNOSIS — R609 Edema, unspecified: Secondary | ICD-10-CM | POA: Diagnosis not present

## 2023-05-24 DIAGNOSIS — L03115 Cellulitis of right lower limb: Secondary | ICD-10-CM | POA: Diagnosis not present
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
## 2023-05-25 ENCOUNTER — Ambulatory Visit: Payer: Medicare Other | Attending: Physician Assistant | Admitting: Physician Assistant

## 2023-05-25 ENCOUNTER — Encounter: Payer: Self-pay | Admitting: Physician Assistant

## 2023-05-25 VITALS — BP 100/50 | HR 63 | Ht 64.0 in | Wt 141.0 lb

## 2023-05-25 DIAGNOSIS — I4819 Other persistent atrial fibrillation: Secondary | ICD-10-CM | POA: Diagnosis not present

## 2023-05-25 DIAGNOSIS — I10 Essential (primary) hypertension: Secondary | ICD-10-CM | POA: Insufficient documentation

## 2023-05-25 DIAGNOSIS — Z7901 Long term (current) use of anticoagulants: Secondary | ICD-10-CM | POA: Diagnosis not present

## 2023-05-25 DIAGNOSIS — R600 Localized edema: Secondary | ICD-10-CM | POA: Insufficient documentation

## 2023-05-25 DIAGNOSIS — I5022 Chronic systolic (congestive) heart failure: Secondary | ICD-10-CM | POA: Diagnosis not present

## 2023-05-25 MED ORDER — DILTIAZEM HCL ER COATED BEADS 300 MG PO CP24: 300.0000 mg | ORAL_CAPSULE | Freq: Every day | ORAL | 2 refills | Status: DC

## 2023-05-25 MED ORDER — FUROSEMIDE 20 MG PO TABS
60.0000 mg | ORAL_TABLET | Freq: Every day | ORAL | 6 refills | Status: DC
  Filled 2023-06-19: qty 90, 30d supply, fill #0
  Filled 2023-07-26: qty 90, 30d supply, fill #1
  Filled 2023-09-07: qty 90, 30d supply, fill #2

## 2023-05-25 MED ORDER — DICLOFENAC SODIUM 1 % EX GEL: 2.0000 g | Freq: Four times a day (QID) | CUTANEOUS | Status: DC

## 2023-05-25 NOTE — Patient Instructions (Addendum)
 Medication Instructions:  INCREASE FUROSEMIDE  (LASIX ) 60MG  DAILY INCREASE DILTIAZEM  240MG  DAILY *If you need a refill on your cardiac medications before your next appointment, please call your pharmacy*  Lab Work: BMET AND BNP If you have labs (blood work) drawn today and your tests are completely normal, you will receive your results only by:  MyChart Message (if you have MyChart) OR  A paper copy in the mail If you have any lab test that is abnormal or we need to change your treatment, we will call you to review the results.  Testing/Procedures: Your physician has requested that you have an echocardiogram. Echocardiography is a painless test that uses sound waves to create images of your heart. It provides your doctor with information about the size and shape of your heart and how well your heart's chambers and valves are working. This procedure takes approximately one hour. There are no restrictions for this procedure. Please do NOT wear cologne, perfume, aftershave, or lotions (deodorant is allowed). Please arrive 15 minutes prior to your appointment time.  Please note: We ask at that you not bring children with you during ultrasound (echo/ vascular) testing. Due to room size and safety concerns, children are not allowed in the ultrasound rooms during exams. Our front office staff cannot provide observation of children in our lobby area while testing is being conducted. An adult accompanying a patient to their appointment will only be allowed in the ultrasound room at the discretion of the ultrasound technician under special circumstances. We apologize for any inconvenience.  Other Instructions DISCUSS LEFT SHOULDER PAIN WITH YOUR PRIMARY CARE INCREASE PROTEIN INTAKE  Follow-Up: At [facility], you and your health needs are our priority.  As part of our continuing mission to provide you with exceptional heart care, we have created designated Provider Care Teams.  These Care Teams  include your primary Cardiologist (physician) and Advanced Practice Providers (APPs -  Physician Assistants and Nurse Practitioners) who all work together to provide you with the care you need, when you need it.  Your next appointment:   2 week(s) (MAKE SURE THIS IS AFTER ECHO)  Provider:   Will Gladis Norton, MD  or Jon Hails, PA-C

## 2023-05-27 LAB — BASIC METABOLIC PANEL
BUN/Creatinine Ratio: 22 (ref 12–28)
BUN: 22 mg/dL (ref 8–27)
CO2: 21 mmol/L (ref 20–29)
Calcium: 8.7 mg/dL (ref 8.7–10.3)
Chloride: 103 mmol/L (ref 96–106)
Creatinine, Ser: 1.02 mg/dL — ABNORMAL HIGH (ref 0.57–1.00)
Glucose: 85 mg/dL (ref 70–99)
Potassium: 4.2 mmol/L (ref 3.5–5.2)
Sodium: 143 mmol/L (ref 134–144)
eGFR: 53 mL/min/{1.73_m2} — ABNORMAL LOW (ref >59)

## 2023-05-27 LAB — BRAIN NATRIURETIC PEPTIDE: BNP: 144.4 pg/mL — ABNORMAL HIGH (ref 0.0–100.0)

## 2023-06-01 ENCOUNTER — Other Ambulatory Visit (HOSPITAL_COMMUNITY): Payer: Self-pay

## 2023-06-01 MED ORDER — LATANOPROST 0.005 % OP SOLN
1.0000 [drp] | Freq: Every day | OPHTHALMIC | 11 refills | Status: DC
  Filled 2023-06-03: qty 5, 50d supply, fill #0
  Filled 2023-08-16: qty 5, 50d supply, fill #1
  Filled 2023-11-15: qty 5, 50d supply, fill #2

## 2023-06-01 MED ORDER — TRIAMCINOLONE ACETONIDE 0.1 % EX CREA
TOPICAL_CREAM | CUTANEOUS | 1 refills | Status: DC
  Filled 2023-07-06: qty 454, 30d supply, fill #0

## 2023-06-01 MED ORDER — DILTIAZEM HCL ER COATED BEADS 240 MG PO CP24
240.0000 mg | ORAL_CAPSULE | Freq: Every day | ORAL | 6 refills | Status: DC
  Filled 2023-06-19: qty 30, 30d supply, fill #0
  Filled 2023-07-19: qty 30, 30d supply, fill #1
  Filled 2023-08-16: qty 30, 30d supply, fill #2
  Filled 2023-09-20: qty 30, 30d supply, fill #3
  Filled 2023-10-19: qty 30, 30d supply, fill #4

## 2023-06-01 MED ORDER — ALENDRONATE SODIUM 70 MG PO TABS: 70.0000 mg | ORAL_TABLET | ORAL | 3 refills | Status: DC

## 2023-06-02 ENCOUNTER — Other Ambulatory Visit (HOSPITAL_COMMUNITY): Payer: Self-pay

## 2023-06-03 ENCOUNTER — Other Ambulatory Visit (HOSPITAL_COMMUNITY): Payer: Self-pay

## 2023-06-03 ENCOUNTER — Other Ambulatory Visit: Payer: Medicare Other

## 2023-06-03 ENCOUNTER — Other Ambulatory Visit: Payer: Self-pay

## 2023-06-03 DIAGNOSIS — I5022 Chronic systolic (congestive) heart failure: Secondary | ICD-10-CM | POA: Diagnosis not present

## 2023-06-03 DIAGNOSIS — E039 Hypothyroidism, unspecified: Secondary | ICD-10-CM | POA: Diagnosis not present

## 2023-06-03 MED FILL — Potassium Chloride Tab ER 10 mEq: ORAL | 90 days supply | Qty: 180 | Fill #0 | Status: AC

## 2023-06-04 LAB — COMPLETE METABOLIC PANEL WITH GFR
AG Ratio: 1 (calc) (ref 1.0–2.5)
ALT: 29 U/L (ref 6–29)
AST: 22 U/L (ref 10–35)
Albumin: 3.3 g/dL — ABNORMAL LOW (ref 3.6–5.1)
Alkaline phosphatase (APISO): 89 U/L (ref 37–153)
BUN/Creatinine Ratio: 17 (calc) (ref 6–22)
BUN: 16 mg/dL (ref 7–25)
CO2: 27 mmol/L (ref 20–32)
Calcium: 8.6 mg/dL (ref 8.6–10.4)
Chloride: 102 mmol/L (ref 98–110)
Creat: 0.96 mg/dL — ABNORMAL HIGH (ref 0.60–0.95)
Globulin: 3.2 g/dL (ref 1.9–3.7)
Glucose, Bld: 104 mg/dL — ABNORMAL HIGH (ref 65–99)
Potassium: 3.8 mmol/L (ref 3.5–5.3)
Sodium: 137 mmol/L (ref 135–146)
Total Bilirubin: 0.4 mg/dL (ref 0.2–1.2)
Total Protein: 6.5 g/dL (ref 6.1–8.1)
eGFR: 57 mL/min/{1.73_m2} — ABNORMAL LOW (ref >=60)

## 2023-06-04 LAB — CBC WITH DIFFERENTIAL/PLATELET
Absolute Lymphocytes: 1705 {cells}/uL (ref 850–3900)
Absolute Monocytes: 992 {cells}/uL — ABNORMAL HIGH (ref 200–950)
Basophils Absolute: 44 {cells}/uL (ref 0–200)
Basophils Relative: 0.5 %
Eosinophils Absolute: 357 {cells}/uL (ref 15–500)
Eosinophils Relative: 4.1 %
HCT: 40.3 % (ref 35.0–45.0)
Hemoglobin: 12.7 g/dL (ref 11.7–15.5)
MCH: 28.1 pg (ref 27.0–33.0)
MCHC: 31.5 g/dL — ABNORMAL LOW (ref 32.0–36.0)
MCV: 89.2 fL (ref 80.0–100.0)
Monocytes Relative: 11.4 %
Neutro Abs: 5603 {cells}/uL (ref 1500–7800)
Neutrophils Relative %: 64.4 %
RBC: 4.52 10*6/uL (ref 3.80–5.10)
RDW: 13.7 % (ref 11.0–15.0)
Total Lymphocyte: 19.6 %
WBC: 8.7 10*3/uL (ref 3.8–10.8)

## 2023-06-04 LAB — LIPID PANEL
Cholesterol: 151 mg/dL (ref <200)
HDL: 58 mg/dL (ref >=50)
LDL Cholesterol (Calc): 78 mg/dL
Non-HDL Cholesterol (Calc): 93 mg/dL (ref <130)
Total CHOL/HDL Ratio: 2.6 (calc) (ref <5.0)
Triglycerides: 72 mg/dL (ref <150)

## 2023-06-04 LAB — TSH: TSH: 8.13 m[IU]/L — ABNORMAL HIGH (ref 0.40–4.50)

## 2023-06-08 ENCOUNTER — Ambulatory Visit: Payer: Medicare Other | Attending: Physician Assistant

## 2023-06-08 DIAGNOSIS — I5022 Chronic systolic (congestive) heart failure: Secondary | ICD-10-CM | POA: Insufficient documentation

## 2023-06-08 LAB — ECHOCARDIOGRAM COMPLETE
AR max vel: 2.07 cm2
AV Area VTI: 2.06 cm2
AV Area mean vel: 2.02 cm2
AV Mean grad: 5 mm[Hg]
AV Peak grad: 8.6 mm[Hg]
AV Vena cont: 0.7 cm
Ao pk vel: 1.47 m/s
Area-P 1/2: 4.21 cm2
Calc EF: 58.1 %
MV VTI: 2.16 cm2
P 1/2 time: 614 ms
S' Lateral: 3.6 cm
Single Plane A2C EF: 63.1 %
Single Plane A4C EF: 51 %
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
## 2023-06-09 ENCOUNTER — Encounter: Payer: Self-pay | Admitting: Internal Medicine

## 2023-06-09 ENCOUNTER — Non-Acute Institutional Stay: Payer: Medicare Other | Admitting: Internal Medicine

## 2023-06-09 VITALS — BP 118/66 | HR 67 | Temp 97.8°F | Resp 17 | Ht 64.0 in | Wt 136.8 lb

## 2023-06-09 DIAGNOSIS — G8929 Other chronic pain: Secondary | ICD-10-CM

## 2023-06-09 DIAGNOSIS — N1831 Chronic kidney disease, stage 3a: Secondary | ICD-10-CM | POA: Diagnosis not present

## 2023-06-09 DIAGNOSIS — R21 Rash and other nonspecific skin eruption: Secondary | ICD-10-CM | POA: Diagnosis not present

## 2023-06-09 DIAGNOSIS — M25561 Pain in right knee: Secondary | ICD-10-CM

## 2023-06-09 DIAGNOSIS — M810 Age-related osteoporosis without current pathological fracture: Secondary | ICD-10-CM

## 2023-06-09 DIAGNOSIS — I5022 Chronic systolic (congestive) heart failure: Secondary | ICD-10-CM | POA: Diagnosis not present

## 2023-06-09 DIAGNOSIS — G3184 Mild cognitive impairment, so stated: Secondary | ICD-10-CM

## 2023-06-09 DIAGNOSIS — M546 Pain in thoracic spine: Secondary | ICD-10-CM | POA: Diagnosis not present

## 2023-06-09 DIAGNOSIS — I480 Paroxysmal atrial fibrillation: Secondary | ICD-10-CM | POA: Diagnosis not present

## 2023-06-09 DIAGNOSIS — R600 Localized edema: Secondary | ICD-10-CM

## 2023-06-09 DIAGNOSIS — R7989 Other specified abnormal findings of blood chemistry: Secondary | ICD-10-CM

## 2023-06-09 DIAGNOSIS — H9193 Unspecified hearing loss, bilateral: Secondary | ICD-10-CM

## 2023-06-09 DIAGNOSIS — I5043 Acute on chronic combined systolic (congestive) and diastolic (congestive) heart failure: Secondary | ICD-10-CM

## 2023-06-09 NOTE — Progress Notes (Signed)
 [facility]:  [facility] Service:  Clinic (12)  Provider:   Code Status: DNR Goals of Care:     06/09/2023   10:30 AM  Advanced Directives  Does Patient Have a Medical Advance Directive? Yes  Type of Estate agent of [place];Living will;Out of facility DNR (pink MOST or yellow form)  Does patient want to make changes to medical advance directive? No - Patient declined  Copy of Healthcare Power of Attorney in Chart? Yes - validated most recent copy scanned in chart (See row information)     Chief Complaint  Patient presents with   Medical Management of Chronic Issues    Patient is here for a follow up and has labs     HPI: Patient is a 89 y.o. female seen today for medical management of chronic diseases.   [place]ives [place] in [facility] with no assist. No Falls Does not drive anymore Acute issue today Discussed the use of AI scribe software for clinical note transcription with the patient, who gave verbal consent to proceed.  History of Present Illness   Bilateral Edema Worsened few weeks ago with [facility] Cardiologist Changed Lasix  to 60 mg every day Since then Swelling has decreased and weeping has stopped Has lost weight skin is extremely dry and peeling, particularly on their leg  Pain in her Back Has area in the back where she wants to try Voltaren  to see if it will help her pain She have been experiencing pain in her Neck area  which she  manage with Oxycodone  at bedtime, but note that the medication's effectiveness seems to be decreasing. They have been avoiding Tylenol  due to concerns about hearing loss. Itching  The patient also reports generalized itching,  Using Steroid cream as needed  Lack of energy chronic Issue The patient also reports a lack of energy and has been managing their daily activities with the help of their son and occasional housekeeping assistance.  Osteoporosis The patient has been  prescribed Fosamax  but decided to discontinue it due to severe arthritic knee pain.       Past Medical History:  Diagnosis Date   Atrial fibrillation (HCC)    Breast CA (HCC)    Cancer (HCC)    Breast and thyroid  cancer   Glaucoma    Hearing impaired    Hearing aids   Hypertension    Osteopenia 05/2013   T score -1.9 FRAX 15%/4.3%   Thyroid  disease    Cancer    Past Surgical History:  Procedure Laterality Date   APPENDECTOMY     BREAST EXCISIONAL BIOPSY Right    benign   BREAST LUMPECTOMY     [id number] radiation   BREAST SURGERY  1989   Lumpectomy right breast   CARDIOVERSION N/A 02/14/2016   Procedure: CARDIOVERSION;  Surgeon: Jacqueline Matsu, MD;  Location: [facility] ENDOSCOPY;  Service: Cardiovascular;  Laterality: N/A;   CARDIOVERSION N/A 08/04/2018   Procedure: CARDIOVERSION;  Surgeon: Sheryle Donning, MD;  Location: [facility] ENDOSCOPY;  Service: Cardiovascular;  Laterality: N/A;   CARDIOVERSION N/A 10/31/2019   Procedure: CARDIOVERSION;  Surgeon: Liza Riggers, MD;  Location: [facility] ENDOSCOPY;  Service: Cardiovascular;  Laterality: N/A;   CARDIOVERSION N/A 05/02/2020   Procedure: CARDIOVERSION;  Surgeon: Euell Herrlich, MD;  Location: [facility] ENDOSCOPY;  Service: Cardiovascular;  Laterality: N/A;   CARDIOVERSION N/A 09/10/2020   Procedure: CARDIOVERSION;  Surgeon: Jann Melody, MD;  Location: [facility]  ENDOSCOPY;  Service: Cardiovascular;  Laterality: N/A;   Mole excised     Benign   THYROID  SURGERY     Removed 1 lobe   TONSILLECTOMY      Allergies  Allergen Reactions   Combigan [Brimonidine Tartrate-Timolol] Itching and Other (See Comments)    Red eye    Pneumococcal Vaccines Other (See Comments)    Pain in arms/legs--nerve issues    Outpatient Encounter Medications as of 06/09/2023  Medication Sig   amiodarone  (PACERONE ) 200 MG tablet Take 1 tablet (200 mg total) by mouth daily.   apixaban  (ELIQUIS ) 5 MG TABS tablet Take 1 tablet (5 mg total) by mouth 2 (two)  times daily.   beta carotene w/minerals (OCUVITE) tablet Take 1 tablet by mouth at bedtime.   Calcium  Citrate-Vitamin D  (CALCIUM  CITRATE + D3 PO) Take 1 tablet by mouth 2 (two) times daily.   carboxymethylcellulose (REFRESH PLUS) 0.5 % SOLN Place 1 drop into both eyes 3 (three) times daily as needed (dry/irritated eyes.).    cholecalciferol  (VITAMIN D ) 25 MCG (1000 UNIT) tablet Take 1,000 Units by mouth in the morning. chewables   diclofenac  Sodium (VOLTAREN ) 1 % GEL Apply 2 g topically 4 (four) times daily.   diltiazem  (CARDIZEM  CD) 240 MG 24 hr capsule Take 1 capsule (240 mg total) by mouth daily.   furosemide  (LASIX ) 20 MG tablet Take 3 tablets (60 mg total) by mouth daily.   glucosamine-chondroitin 500-400 MG tablet Take 1 tablet by mouth in the morning and at bedtime.   latanoprost  (XALATAN ) 0.005 % ophthalmic solution Place 1 drop into both eyes at bedtime.    latanoprost  (XALATAN ) 0.005 % ophthalmic solution Place 1 drop into both eyes at bedtime.   levothyroxine  (SYNTHROID ) 100 MCG tablet Take 1 tablet (100 mcg total) by mouth daily before breakfast.   Multiple Vitamin (MULTIVITAMIN WITH MINERALS) TABS tablet Take 1 tablet by mouth daily with lunch. Adults 50+   oxyCODONE  (OXY IR/ROXICODONE ) 5 MG immediate release tablet TAKE ONE TABLET BY MOUTH TWICE DAILY   potassium chloride  (KLOR-CON ) 10 MEQ tablet Take 2 tablets (20 mEq total) by mouth daily.   sacubitril -valsartan  (ENTRESTO ) 24-26 MG Take 1 tablet by mouth 2 (two) times daily.   Emollient (AQUAPHOR ADVANCED THERAPY) OINT Apply 1 applicator topically 2 (two) times daily. (Patient taking differently: Apply 1 applicator topically 2 (two) times daily as needed.)   Polyethylene Glycol 400 (BLINK TEARS) 0.25 % SOLN Place 1-2 drops into both eyes 3 (three) times daily as needed (dry/irritated eyes).   triamcinolone  cream (KENALOG ) 0.1 % Apply to the affected areas twice daily for up to 2 weeks at a time. Avoid application to the face, groin,  armpits, and skin folds (Patient taking differently: Apply 1 Application topically 2 (two) times daily as needed.)   [DISCONTINUED] alendronate  (FOSAMAX ) 70 MG tablet Take 1 tablet (70 mg total) by mouth every 7 (seven) days. Take with a full glass of water on an empty stomach (Patient not taking: Reported on 06/09/2023)   [DISCONTINUED] diltiazem  (CARDIZEM  CD) 300 MG 24 hr capsule Take 1 capsule (300 mg total) by mouth daily. (Patient not taking: Reported on 06/09/2023)   [DISCONTINUED] triamcinolone  ointment (KENALOG ) 0.1 % Apply 1 Application topically daily. (Patient not taking: Reported on 06/09/2023)   No facility-administered encounter medications on file as of 06/09/2023.    Review of Systems:  Review of Systems  Constitutional:  Negative for activity change and appetite change.  HENT: Negative.    Respiratory:  Negative  for cough and shortness of breath.   Cardiovascular:  Positive for leg swelling.  Gastrointestinal:  Negative for constipation.  Genitourinary: Negative.   Musculoskeletal:  Positive for arthralgias, back pain, gait problem and neck pain. Negative for myalgias.  Skin: Negative.   Neurological:  Positive for weakness. Negative for dizziness.  Psychiatric/Behavioral:  Positive for dysphoric mood. Negative for confusion and sleep disturbance.     Health Maintenance  Topic Date Due   COVID-19 Vaccine (8 - 2024-25 season) 06/25/2023 (Originally 06/07/2023)   Medicare Annual Wellness (AWV)  10/26/2023   INFLUENZA VACCINE  Completed   DEXA SCAN  Completed   HPV VACCINES  Aged Out   DTaP/Tdap/Td  Discontinued   Pneumonia Vaccine 63+ Years old  Discontinued   Zoster Vaccines- Shingrix  Discontinued    Physical Exam: Vitals:   06/09/23 1027  BP: 118/66  Pulse: 67  Resp: 17  Temp: 97.8 F (36.6 C)  TempSrc: Temporal  SpO2: 95%  Weight: 136 lb 12.8 oz (62.1 kg)  Height: 5\' 4"  (1.626 m)   Body mass index is 23.48 kg/m. Physical Exam Vitals reviewed.   Constitutional:      Appearance: Normal appearance.  HENT:     Head: Normocephalic.     Nose: Nose normal.     Mouth/Throat:     Mouth: Mucous membranes are moist.     Pharynx: Oropharynx is clear.  Eyes:     Pupils: Pupils are equal, round, and reactive to light.  Cardiovascular:     Rate and Rhythm: Normal rate and regular rhythm.     Pulses: Normal pulses.     Heart sounds: Normal heart sounds. No murmur heard. Pulmonary:     Effort: Pulmonary effort is normal.     Breath sounds: Normal breath sounds.  Abdominal:     General: Abdomen is flat. Bowel sounds are normal.     Palpations: Abdomen is soft.  Musculoskeletal:        General: Swelling present.     Cervical back: Neck supple.     Comments: Better and Not leaking anymore just Dry  Does have Kyphosis  Skin:    General: Skin is warm.     Comments: Skin is dry and she has Papular Macular rash in different area where she has been itching  Neurological:     General: No focal deficit present.     Mental Status: She is alert and oriented to person, place, and time.  Psychiatric:        Mood and Affect: Mood normal.        Thought Content: Thought content normal.     Labs reviewed: Basic Metabolic Panel: Recent Labs    02/01/23 0810 03/25/23 0944 05/25/23 1223 06/03/23 0756  NA  --  140 143 137  K  --  4.1 4.2 3.8  CL  --  103 103 102  CO2  --  29 21 27   GLUCOSE  --  83 85 104*  BUN  --  24 22 16   CREATININE  --  1.06* 1.02* 0.96*  CALCIUM   --  8.5* 8.7 8.6  TSH 4.67* 3.660  --  8.13*   Liver Function Tests: Recent Labs    09/07/22 0820 03/25/23 0944 06/03/23 0756  AST 15 26 22   ALT 13 22 29   ALKPHOS  --  77  --   BILITOT 0.5 0.3 0.4  PROT 7.0 6.4 6.5  ALBUMIN  --  3.6*  --    No  results for input(s): "LIPASE", "AMYLASE" in the last 8760 hours. No results for input(s): "AMMONIA" in the last 8760 hours. CBC: Recent Labs    09/07/22 0820 03/25/23 0944 06/03/23 0756  WBC 6.6 5.9 8.7   NEUTROABS 3,637  --  5,603  HGB 13.0 11.8 12.7  HCT 40.2 36.8 40.3  MCV 88.7 90 89.2  PLT 68* CANCELED CANCELED   Lipid Panel: Recent Labs    09/07/22 0820 06/03/23 0756  CHOL 190 151  HDL 72 58  LDLCALC 104* 78  TRIG 56 72  CHOLHDL 2.6 2.6   No results found for: "HGBA1C"  Procedures since last visit: ECHOCARDIOGRAM COMPLETE Result Date: 06/08/2023    ECHOCARDIOGRAM REPORT   Patient Name:   RAKYA GIAMBATTISTA Date of Exam: 06/08/2023 Medical Rec #:  [id number]     Height:       64.0 in Accession #:    [id number]    Weight:       141.0 lb Date of Birth:  December 18, 1931    BSA:          1.686 m Patient Age:    89 years      BP:           110/66 mmHg Patient Gender: F             HR:           56 bpm. Exam Location:  [place] Procedure: 2D Echo, Cardiac Doppler and Color Doppler Indications:    I50.22 Chronic systolic (congestive) heart failure  History:        Patient has no prior history of Echocardiogram examinations and                 Patient has prior history of Echocardiogram examinations, most                 recent 10/01/2020. CHF, Chronic kidney disease, Arrythmias:Atrial                 Fibrillation, Signs/Symptoms:Edema; Risk Factors:Dyslipidemia                 and Non-Smoker.  Sonographer:    Reed Canes RCS, RVS Referring Phys: [id number] ANGELA NICOLE DUKE  Sonographer Comments: Global longitudinal strain was attempted. IMPRESSIONS  1. Left ventricular ejection fraction, by estimation, is 55 to 60%. The left ventricle has normal function. The left ventricle has no regional wall motion abnormalities. Left ventricular diastolic parameters were normal.  2. Right ventricular systolic function is normal. The right ventricular size is normal. There is mildly elevated pulmonary artery systolic pressure. The estimated right ventricular systolic pressure is 44.2 mmHg.  3. The mitral valve is normal in structure. Mild mitral valve regurgitation. No evidence of mitral stenosis.  4. The aortic valve is  tricuspid. Aortic valve regurgitation is mild. No aortic stenosis is present. Aortic regurgitation PHT measures 614 msec.  5. Aortic dilatation noted. There is mild dilatation of the ascending aorta, measuring 41 mm. Aortic root is normal in size.  6. The inferior vena cava is dilated in size with >50% respiratory variability, suggesting right atrial pressure of 8 mmHg. Comparison(s): Changes from prior study are noted. LVEF improved from 40-45% to normal now. FINDINGS  Left Ventricle: Left ventricular ejection fraction, by estimation, is 55 to 60%. The left ventricle has normal function. The left ventricle has no regional wall motion abnormalities. The left ventricular internal cavity size was normal in size. There is  no left ventricular hypertrophy.  Left ventricular diastolic parameters were normal. Right Ventricle: The right ventricular size is normal. No increase in right ventricular wall thickness. Right ventricular systolic function is normal. There is mildly elevated pulmonary artery systolic pressure. The tricuspid regurgitant velocity is 3.01  m/s, and with an assumed right atrial pressure of 8 mmHg, the estimated right ventricular systolic pressure is 44.2 mmHg. Left Atrium: Left atrial size was normal in size. Right Atrium: Right atrial size was normal in size. Pericardium: There is no evidence of pericardial effusion. Mitral Valve: The mitral valve is normal in structure. Mild mitral valve regurgitation. No evidence of mitral valve stenosis. MV peak gradient, 2.7 mmHg. The mean mitral valve gradient is 1.0 mmHg. Tricuspid Valve: The tricuspid valve is normal in structure. Tricuspid valve regurgitation is mild . No evidence of tricuspid stenosis. Aortic Valve: The aortic valve is tricuspid. Aortic valve regurgitation is mild. Aortic regurgitation PHT measures 614 msec. No aortic stenosis is present. Aortic valve mean gradient measures 5.0 mmHg. Aortic valve peak gradient measures 8.6 mmHg. Aortic  valve  area, by VTI measures 2.06 cm. Pulmonic Valve: The pulmonic valve was normal in structure. Pulmonic valve regurgitation is mild. No evidence of pulmonic stenosis. Aorta: The aortic root is normal in size and structure and aortic dilatation noted. There is mild dilatation of the ascending aorta, measuring 41 mm. Venous: The inferior vena cava is dilated in size with greater than 50% respiratory variability, suggesting right atrial pressure of 8 mmHg. IAS/Shunts: No atrial level shunt detected by color flow Doppler.  LEFT VENTRICLE PLAX 2D LVIDd:         5.00 cm     Diastology LVIDs:         3.60 cm     LV e' medial:    5.44 cm/s LV PW:         1.10 cm     LV E/e' medial:  13.0 LV IVS:        1.10 cm     LV e' lateral:   8.59 cm/s LVOT diam:     2.10 cm     LV E/e' lateral: 8.2 LV SV:         82 LV SV Index:   49 LVOT Area:     3.46 cm  LV Volumes (MOD) LV vol d, MOD A2C: 87.8 ml LV vol d, MOD A4C: 64.5 ml LV vol s, MOD A2C: 32.4 ml LV vol s, MOD A4C: 31.6 ml LV SV MOD A2C:     55.4 ml LV SV MOD A4C:     64.5 ml LV SV MOD BP:      46.0 ml RIGHT VENTRICLE RV Basal diam:  3.70 cm RV Mid diam:    3.50 cm RV S prime:     10.20 cm/s TAPSE (M-mode): 2.9 cm LEFT ATRIUM           Index        RIGHT ATRIUM           Index LA diam:      4.80 cm 2.85 cm/m   RA Area:     13.00 cm LA Vol (A2C): 48.7 ml 28.88 ml/m  RA Volume:   29.60 ml  17.55 ml/m LA Vol (A4C): 50.9 ml 30.18 ml/m  AORTIC VALVE                     PULMONIC VALVE AV Area (Vmax):    2.07 cm      PV  Vmax:          1.16 m/s AV Area (Vmean):   2.02 cm      PV Peak grad:     5.4 mmHg AV Area (VTI):     2.06 cm      PR End Diast Vel: 6.86 msec AV Vmax:           147.00 cm/s AV Vmean:          110.000 cm/s AV VTI:            0.401 m AV Peak Grad:      8.6 mmHg AV Mean Grad:      5.0 mmHg LVOT Vmax:         87.90 cm/s LVOT Vmean:        64.100 cm/s LVOT VTI:          0.238 m LVOT/AV VTI ratio: 0.59 AI PHT:            614 msec AR Vena Contracta: 0.70 cm  AORTA Ao  Root diam: 3.20 cm Ao Asc diam:  4.10 cm MITRAL VALVE               TRICUSPID VALVE MV Area (PHT): 4.21 cm    TR Peak grad:   36.2 mmHg MV Area VTI:   2.16 cm    TR Vmax:        301.00 cm/s MV Peak grad:  2.7 mmHg MV Mean grad:  1.0 mmHg    SHUNTS MV Vmax:       0.82 m/s    Systemic VTI:  0.24 m MV Vmean:      53.7 cm/s   Systemic Diam: 2.10 cm MV Decel Time: 180 msec MV E velocity: 70.70 cm/s MV A velocity: 56.10 cm/s MV E/A ratio:  1.26 Vishnu Priya Mallipeddi Electronically signed by Lucetta Russel Mallipeddi Signature Date/Time: 06/08/2023/2:03:29 PM    Final     Assessment/Plan 1. Chronic pain of right knee (Primary) Uses Topical Voltaren   2. Chronic left-sided thoracic back pain Oxycodone  used once daily at bedtime. Some nights pain is not fully relieved. -Consider adding occasional Tylenol  for additional pain relief.     3. Rash and nonspecific skin eruption Exacerbated by increased Lasix  dosage. Using prescribed steroid cream intermittently. -Apply Aquaphor at night for additional relief.   4. Age-related osteoporosis without current pathological fracture Did not like Fosamax  Is not interested in any new Med  5. Chronic systolic congestive heart failure (HCC) Echo done recently showed EF of 55% Doing well with Increased dose of Lasix  Labs look good No Dizziness   7 Paroxysmal atrial fibrillation (HCC) Rate controlled on Cardizem  Dose reduced BP good On Eliquis   9. Mild cognitive impairment MMSE 28/30 Managing well in her apartment  Son Helps  10. Bilateral hearing loss, unspecified hearing loss type   11. Stage 3a chronic kidney disease (HCC)   12. Elevated TSH Will Continue to monitor      Labs/tests ordered:  * No order type specified * Next appt:  Visit date not found

## 2023-06-10 ENCOUNTER — Other Ambulatory Visit (HOSPITAL_COMMUNITY): Payer: Self-pay

## 2023-06-10 ENCOUNTER — Other Ambulatory Visit: Payer: Self-pay | Admitting: Internal Medicine

## 2023-06-10 ENCOUNTER — Other Ambulatory Visit (HOSPITAL_BASED_OUTPATIENT_CLINIC_OR_DEPARTMENT_OTHER): Payer: Self-pay

## 2023-06-10 MED ORDER — LEVOTHYROXINE SODIUM 100 MCG PO TABS
100.0000 ug | ORAL_TABLET | Freq: Every day | ORAL | 1 refills | Status: DC
  Filled 2023-06-10: qty 90, 90d supply, fill #0
  Filled 2023-06-19 – 2023-09-07 (×2): qty 90, 90d supply, fill #1

## 2023-06-11 ENCOUNTER — Ambulatory Visit: Payer: Medicare Other | Attending: General Practice | Admitting: General Practice

## 2023-06-11 ENCOUNTER — Encounter: Payer: Self-pay | Admitting: Orthopaedic Surgery

## 2023-06-11 ENCOUNTER — Other Ambulatory Visit (HOSPITAL_BASED_OUTPATIENT_CLINIC_OR_DEPARTMENT_OTHER): Payer: Self-pay

## 2023-06-11 ENCOUNTER — Other Ambulatory Visit (HOSPITAL_COMMUNITY): Payer: Self-pay

## 2023-06-11 ENCOUNTER — Telehealth: Payer: Self-pay

## 2023-06-11 ENCOUNTER — Encounter: Payer: Self-pay | Admitting: General Practice

## 2023-06-11 ENCOUNTER — Ambulatory Visit (INDEPENDENT_AMBULATORY_CARE_PROVIDER_SITE_OTHER): Payer: Medicare Other | Admitting: Orthopaedic Surgery

## 2023-06-11 VITALS — BP 106/60 | HR 64 | Ht 66.0 in | Wt 138.2 lb

## 2023-06-11 DIAGNOSIS — I4819 Other persistent atrial fibrillation: Secondary | ICD-10-CM | POA: Insufficient documentation

## 2023-06-11 DIAGNOSIS — M1711 Unilateral primary osteoarthritis, right knee: Secondary | ICD-10-CM

## 2023-06-11 DIAGNOSIS — R600 Localized edema: Secondary | ICD-10-CM | POA: Insufficient documentation

## 2023-06-11 DIAGNOSIS — I5022 Chronic systolic (congestive) heart failure: Secondary | ICD-10-CM | POA: Insufficient documentation

## 2023-06-11 NOTE — Patient Instructions (Addendum)
Medication Instructions:  The current medical regimen is effective;  continue present plan and medications as directed. Please refer to the Current Medication list given to you today.  *If you need a refill on your cardiac medications before your next appointment, please call your pharmacy*  Lab Work: NONE  Other Instructions PLEASE READ AND FOLLOW ATTACHED  SALTY 6 CONTINUE TO WEAR YOUR COMPRESSION STOCKINGS ELEVATE YOUR LOWER EXTREMITIES WATCH FOR CELLULITIS SYMPTOMS:  pain, redness, swelling, and warmth LOG YOUR DAILY WEIGHT  Follow-Up: At [facility], you and your health needs are our priority.  As part of our continuing mission to provide you with exceptional heart care, we have created designated Provider Care Teams.  These Care Teams include your primary Cardiologist (physician) and Advanced Practice Providers (APPs -  Physician Assistants and Nurse Practitioners) who all work together to provide you with the care you need, when you need it.  Your next appointment:   3 month(s)  Provider:   Will Jorja Loa, MD  or Edd Fabian, FNP or Micah Flesher, PA-C

## 2023-06-11 NOTE — Progress Notes (Signed)
Office Visit Note   Patient: Laura Lee           Date of Birth: May 26, 1931           MRN: [id number] Visit Date: 06/11/2023              Requested by: Mahlon Gammon, MD [address] [place],  [place] [address] PCP: Mahlon Gammon, MD   Assessment & Plan: Visit Diagnoses:  1. Primary osteoarthritis of right knee     Plan: Impression is right knee osteoarthritis in addition to right lower extremity discomfort.  Hard to tell whether her symptoms are slowly coming from the lymphedema or from her knee.  With the left lower extremity weeping, we do not feel comfortable injecting the right knee with cortisone today.  She will follow-up with her cardiologist and try and get the lymphedema under control.  We have sent in approval for right knee viscosupplementation injection.  Follow-up once approved.  This patient is diagnosed with osteoarthritis of the knee(s).    Radiographs show evidence of joint space narrowing, osteophytes, subchondral sclerosis and/or subchondral cysts.  This patient has knee pain which interferes with functional and activities of daily living.    This patient has experienced inadequate response, adverse effects and/or intolerance with conservative treatments such as acetaminophen, NSAIDS, topical creams, physical therapy or regular exercise, knee bracing and/or weight loss.   This patient has experienced inadequate response or has a contraindication to intra articular steroid injections for at least 3 months.   This patient is not scheduled to have a total knee replacement within 6 months of starting treatment with viscosupplementation.   Follow-Up Instructions: Return if symptoms worsen or fail to improve.   Orders:  No orders of the defined types were placed in this encounter.  No orders of the defined types were placed in this encounter.     Procedures: No procedures performed   Clinical Data: No additional findings.   Subjective: Chief  Complaint  Patient presents with   Right Knee - Pain    HPI patient is a very pleasant 89 year old female who comes in today with recurrent right knee pain.  She was seen in our clinic 03/04/2023 for this.  Right knee was injected with cortisone which helped quite a bit but only lasted for about a month.  Symptoms have returned.  She is here today requesting a repeat cortisone injection.  Of note, she has venous stasis to both legs which is currently managed by what sounds like her cardiologist.  She has been getting Dynaflex wraps.  She is having slight weeping to the left lower extremity today.  Review of Systems as detailed in HPI.  All others reviewed and are negative.   Objective: Vital Signs: There were no vitals taken for this visit.  Physical Exam well-developed well-nourished female no acute distress.  Alert and oriented x 3.  Ortho Exam unchanged right knee exam  Specialty Comments:  No specialty comments available.  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   Bilateral edema of lower extremity 02/03/2023   Age-related osteoporosis without current pathological fracture 02/03/2023   Rash and nonspecific skin eruption 02/03/2023   Mild cognitive impairment 02/03/2023   Bilateral primary osteoarthritis of knee 09/16/2022   Bilateral hearing loss 09/16/2022   Glaucoma 09/16/2022   Other thrombophilia (HCC) 09/16/2022   Primary insomnia 09/16/2022  Sciatica 09/16/2022   Thrombocytopenia (HCC) 09/16/2022   Venous insufficiency 09/16/2022   Cellulitis 12/24/2020   CKD (chronic kidney disease) stage 3, GFR 30-59 ml/min (HCC) 08/20/2020   Folliculitis 01/15/2020   Slow transit constipation 12/19/2019   Dysphagia 12/19/2019   Hypothyroidism 12/19/2019   C2 cervical fracture (HCC) 10/26/2019   A-fib (HCC) 10/26/2019   Hypokalemia 10/26/2019   Paroxysmal atrial fibrillation (HCC)     Essential hypertension 05/26/2016   CHF (congestive heart failure) (HCC) 02/04/2016   Persistent atrial fibrillation (HCC) 02/04/2016   Past Medical History:  Diagnosis Date   Atrial fibrillation (HCC)    Breast CA (HCC)    Cancer (HCC)    Breast and thyroid cancer   Glaucoma    Hearing impaired    Hearing aids   Hypertension    Osteopenia 05/2013   T score -1.9 FRAX 15%/4.3%   Thyroid disease    Cancer    Family History  Problem Relation Age of Onset   Breast cancer Mother 28   Lung cancer Sister    Heart attack Brother    Breast cancer Daughter 5    Past Surgical History:  Procedure Laterality Date   APPENDECTOMY     BREAST EXCISIONAL BIOPSY Right    benign   BREAST LUMPECTOMY     [id number] radiation   BREAST SURGERY  1989   Lumpectomy right breast   CARDIOVERSION N/A 02/14/2016   Procedure: CARDIOVERSION;  Surgeon: Quintella Reichert, MD;  Location: [facility] ENDOSCOPY;  Service: Cardiovascular;  Laterality: N/A;   CARDIOVERSION N/A 08/04/2018   Procedure: CARDIOVERSION;  Surgeon: Jodelle Red, MD;  Location: [facility] ENDOSCOPY;  Service: Cardiovascular;  Laterality: N/A;   CARDIOVERSION N/A 10/31/2019   Procedure: CARDIOVERSION;  Surgeon: Lars Masson, MD;  Location: [facility] ENDOSCOPY;  Service: Cardiovascular;  Laterality: N/A;   CARDIOVERSION N/A 05/02/2020   Procedure: CARDIOVERSION;  Surgeon: Parke Poisson, MD;  Location: [facility] ENDOSCOPY;  Service: Cardiovascular;  Laterality: N/A;   CARDIOVERSION N/A 09/10/2020   Procedure: CARDIOVERSION;  Surgeon: Christell Constant, MD;  Location: [facility] ENDOSCOPY;  Service: Cardiovascular;  Laterality: N/A;   Mole excised     Benign   THYROID SURGERY     Removed 1 lobe   TONSILLECTOMY     Social History   Occupational History   Occupation: retired  Tobacco Use   Smoking status: Never   Smokeless tobacco: Never  Vaping Use   Vaping status: Never Used  Substance and Sexual Activity   Alcohol use: Not Currently   Drug  use: No   Sexual activity: Never    Comment: 1st intercourse 69 yo-1 partner

## 2023-06-11 NOTE — Telephone Encounter (Signed)
Please precert for right knee visco injection. Dr.Xu's patient.

## 2023-06-15 DIAGNOSIS — I872 Venous insufficiency (chronic) (peripheral): Secondary | ICD-10-CM | POA: Diagnosis not present

## 2023-06-15 DIAGNOSIS — L309 Dermatitis, unspecified: Secondary | ICD-10-CM | POA: Diagnosis not present

## 2023-06-15 DIAGNOSIS — L821 Other seborrheic keratosis: Secondary | ICD-10-CM | POA: Diagnosis not present

## 2023-06-17 DIAGNOSIS — H906 Mixed conductive and sensorineural hearing loss, bilateral: Secondary | ICD-10-CM | POA: Diagnosis not present

## 2023-06-19 ENCOUNTER — Other Ambulatory Visit (HOSPITAL_COMMUNITY): Payer: Self-pay

## 2023-06-21 ENCOUNTER — Other Ambulatory Visit (HOSPITAL_COMMUNITY): Payer: Self-pay

## 2023-06-24 ENCOUNTER — Other Ambulatory Visit (HOSPITAL_COMMUNITY): Payer: Medicare Other

## 2023-06-24 NOTE — Telephone Encounter (Signed)
VOB submitted for Monovisc, right knee

## 2023-06-28 ENCOUNTER — Other Ambulatory Visit (HOSPITAL_COMMUNITY): Payer: Self-pay

## 2023-07-01 ENCOUNTER — Other Ambulatory Visit (HOSPITAL_COMMUNITY): Payer: Self-pay

## 2023-07-01 ENCOUNTER — Other Ambulatory Visit: Payer: Self-pay

## 2023-07-01 MED ORDER — OXYCODONE HCL 5 MG PO TABS
5.0000 mg | ORAL_TABLET | Freq: Two times a day (BID) | ORAL | 0 refills | Status: DC
  Filled 2023-07-01: qty 60, 30d supply, fill #0

## 2023-07-01 NOTE — Telephone Encounter (Signed)
 Patient is requesting a refill of the following medications: Requested Prescriptions   Pending Prescriptions Disp Refills   oxyCODONE  (OXY IR/ROXICODONE ) 5 MG immediate release tablet 60 tablet 0    Sig: Take 1 tablet (5 mg total) by mouth 2 (two) times daily.    Date of last refill:04/26/2023  Refill amount: 60 tablets 1 refill   Treatment agreement date:

## 2023-07-06 MED FILL — Amiodarone HCl Tab 200 MG: ORAL | 90 days supply | Qty: 90 | Fill #0 | Status: AC

## 2023-07-07 ENCOUNTER — Other Ambulatory Visit (HOSPITAL_COMMUNITY): Payer: Self-pay

## 2023-07-07 ENCOUNTER — Other Ambulatory Visit: Payer: Self-pay

## 2023-07-12 ENCOUNTER — Other Ambulatory Visit (HOSPITAL_COMMUNITY): Payer: Self-pay

## 2023-07-12 ENCOUNTER — Other Ambulatory Visit: Payer: Self-pay

## 2023-07-13 DIAGNOSIS — I872 Venous insufficiency (chronic) (peripheral): Secondary | ICD-10-CM | POA: Diagnosis not present

## 2023-07-13 DIAGNOSIS — L309 Dermatitis, unspecified: Secondary | ICD-10-CM | POA: Diagnosis not present

## 2023-07-15 DIAGNOSIS — H35359 Cystoid macular degeneration, unspecified eye: Secondary | ICD-10-CM | POA: Diagnosis not present

## 2023-07-15 DIAGNOSIS — H401212 Low-tension glaucoma, right eye, moderate stage: Secondary | ICD-10-CM | POA: Diagnosis not present

## 2023-07-19 ENCOUNTER — Other Ambulatory Visit (HOSPITAL_COMMUNITY): Payer: Self-pay

## 2023-07-22 ENCOUNTER — Other Ambulatory Visit (HOSPITAL_COMMUNITY): Payer: Self-pay

## 2023-07-26 ENCOUNTER — Other Ambulatory Visit: Payer: Self-pay

## 2023-07-27 DIAGNOSIS — H401223 Low-tension glaucoma, left eye, severe stage: Secondary | ICD-10-CM | POA: Diagnosis not present

## 2023-07-27 DIAGNOSIS — H353130 Nonexudative age-related macular degeneration, bilateral, stage unspecified: Secondary | ICD-10-CM | POA: Diagnosis not present

## 2023-08-16 ENCOUNTER — Other Ambulatory Visit: Payer: Self-pay

## 2023-08-23 ENCOUNTER — Other Ambulatory Visit: Payer: Self-pay

## 2023-08-23 ENCOUNTER — Other Ambulatory Visit (HOSPITAL_COMMUNITY): Payer: Self-pay

## 2023-08-23 ENCOUNTER — Other Ambulatory Visit: Payer: Self-pay | Admitting: Adult Health

## 2023-08-23 MED ORDER — OXYCODONE HCL 5 MG PO TABS
5.0000 mg | ORAL_TABLET | Freq: Two times a day (BID) | ORAL | 0 refills | Status: DC
  Filled 2023-08-23: qty 60, 30d supply, fill #0

## 2023-08-23 NOTE — Telephone Encounter (Signed)
 Pharmacy requested refill.  Epic LR 07/01/2023 Contract Date: 10/29/2021 Note added to upcoming appointment to update.   Pended Rx and sent to Dr. Chales Abrahams for approval.

## 2023-08-24 ENCOUNTER — Other Ambulatory Visit: Payer: Self-pay

## 2023-08-25 ENCOUNTER — Other Ambulatory Visit (HOSPITAL_COMMUNITY): Payer: Self-pay
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
## 2023-09-03 ENCOUNTER — Encounter (HOSPITAL_BASED_OUTPATIENT_CLINIC_OR_DEPARTMENT_OTHER): Payer: Self-pay

## 2023-09-06 ENCOUNTER — Other Ambulatory Visit (HOSPITAL_COMMUNITY): Payer: Self-pay

## 2023-09-06 ENCOUNTER — Other Ambulatory Visit: Payer: Self-pay

## 2023-09-06 ENCOUNTER — Ambulatory Visit: Payer: Medicare Other | Attending: Physician Assistant | Admitting: Physician Assistant

## 2023-09-06 ENCOUNTER — Telehealth: Payer: Self-pay | Admitting: Physician Assistant

## 2023-09-06 ENCOUNTER — Encounter: Payer: Self-pay | Admitting: Physician Assistant

## 2023-09-06 VITALS — BP 106/66 | HR 71 | Ht 64.0 in | Wt 131.0 lb

## 2023-09-06 DIAGNOSIS — Z79899 Other long term (current) drug therapy: Secondary | ICD-10-CM | POA: Insufficient documentation

## 2023-09-06 DIAGNOSIS — I5022 Chronic systolic (congestive) heart failure: Secondary | ICD-10-CM | POA: Diagnosis not present

## 2023-09-06 DIAGNOSIS — I4819 Other persistent atrial fibrillation: Secondary | ICD-10-CM | POA: Diagnosis not present

## 2023-09-06 DIAGNOSIS — R600 Localized edema: Secondary | ICD-10-CM | POA: Insufficient documentation

## 2023-09-06 DIAGNOSIS — Z7901 Long term (current) use of anticoagulants: Secondary | ICD-10-CM | POA: Diagnosis not present

## 2023-09-06 LAB — BASIC METABOLIC PANEL WITH GFR
BUN/Creatinine Ratio: 23 (ref 12–28)
BUN: 23 mg/dL (ref 8–27)
CO2: 27 mmol/L (ref 20–29)
Calcium: 9 mg/dL (ref 8.7–10.3)
Chloride: 101 mmol/L (ref 96–106)
Creatinine, Ser: 0.98 mg/dL (ref 0.57–1.00)
Glucose: 83 mg/dL (ref 70–99)
Potassium: 4.1 mmol/L (ref 3.5–5.2)
Sodium: 140 mmol/L (ref 134–144)
eGFR: 55 mL/min/{1.73_m2} — ABNORMAL LOW (ref >59)

## 2023-09-06 MED ORDER — APIXABAN 2.5 MG PO TABS
2.5000 mg | ORAL_TABLET | Freq: Two times a day (BID) | ORAL | 3 refills | Status: AC
  Filled 2023-09-06 – 2024-01-10 (×2): qty 180, 90d supply, fill #0
  Filled 2024-04-03: qty 180, 90d supply, fill #1

## 2023-09-06 NOTE — Patient Instructions (Addendum)
 Medication Instructions:  Decrease Eliquis 2.5 mg twice daily  *If you need a refill on your cardiac medications before your next appointment, please call your pharmacy*  Lab Work: BMET today  Testing/Procedures: NONE ordered at this time of appointment   Follow-Up: At [facility], you and your health needs are our priority.  As part of our continuing mission to provide you with exceptional heart care, our providers are all part of one team.  This team includes your primary Cardiologist (physician) and Advanced Practice Providers or APPs (Physician Assistants and Nurse Practitioners) who all work together to provide you with the care you need, when you need it.  Your next appointment:   4 month(s)  Provider:    Marcie Sever, PA-C        We recommend signing up for the patient portal called "MyChart".  Sign up information is provided on this After Visit Summary.  MyChart is used to connect with patients for Virtual Visits (Telemedicine).  Patients are able to view lab/test results, encounter notes, upcoming appointments, etc.  Non-urgent messages can be sent to your provider as well.   To learn more about what you can do with MyChart, go to [email or web address].   Other Instructions       1st Floor: - Lobby - Registration  - Pharmacy  - Lab - Cafe  2nd Floor: - PV Lab - Diagnostic Testing (echo, CT, nuclear med)  3rd Floor: - Vacant  4th Floor: - TCTS (cardiothoracic surgery) - AFib Clinic - Structural Heart Clinic - Vascular Surgery  - Vascular Ultrasound  5th Floor: - HeartCare Cardiology (general and EP) - Clinical Pharmacy for coumadin, hypertension, lipid, weight-loss medications, and med management appointments    Valet parking services will be available as well.

## 2023-09-06 NOTE — Telephone Encounter (Signed)
 Pt c/o medication issue:  1. Name of Medication: Eliquis  2. How are you currently taking this medication (dosage and times per day)?   3. Are you having a reaction (difficulty breathing--STAT)?   4. What is your medication issue? Patient wants to know if she can just 1 tablet of 5 mg a day instead of cutting the 5 mg in half?

## 2023-09-06 NOTE — Telephone Encounter (Signed)
 Spoke with pt and david, aware eliquis does not last for 24 hours so it needs to be taken twice daily. Aware 2.5 mg prescription has been sent to the pharmacy.

## 2023-09-07 ENCOUNTER — Other Ambulatory Visit (HOSPITAL_COMMUNITY): Payer: Self-pay

## 2023-09-08 ENCOUNTER — Telehealth: Payer: Self-pay

## 2023-09-08 NOTE — Telephone Encounter (Signed)
 Left a detailed message for pt with lab results. Pt advised to call back if she has any questions or concerns.

## 2023-09-20 ENCOUNTER — Other Ambulatory Visit (HOSPITAL_COMMUNITY): Payer: Self-pay

## 2023-10-06 ENCOUNTER — Non-Acute Institutional Stay: Payer: Medicare Other | Admitting: Internal Medicine

## 2023-10-06 ENCOUNTER — Encounter: Payer: Self-pay | Admitting: Internal Medicine

## 2023-10-06 VITALS — BP 106/60 | HR 71 | Temp 97.7°F | Resp 16 | Ht 64.0 in | Wt 128.0 lb

## 2023-10-06 DIAGNOSIS — I5022 Chronic systolic (congestive) heart failure: Secondary | ICD-10-CM

## 2023-10-06 DIAGNOSIS — E039 Hypothyroidism, unspecified: Secondary | ICD-10-CM

## 2023-10-06 DIAGNOSIS — I480 Paroxysmal atrial fibrillation: Secondary | ICD-10-CM

## 2023-10-06 DIAGNOSIS — R21 Rash and other nonspecific skin eruption: Secondary | ICD-10-CM | POA: Diagnosis not present

## 2023-10-06 DIAGNOSIS — M810 Age-related osteoporosis without current pathological fracture: Secondary | ICD-10-CM

## 2023-10-06 DIAGNOSIS — R7989 Other specified abnormal findings of blood chemistry: Secondary | ICD-10-CM

## 2023-10-06 DIAGNOSIS — H9193 Unspecified hearing loss, bilateral: Secondary | ICD-10-CM

## 2023-10-06 DIAGNOSIS — G3184 Mild cognitive impairment, so stated: Secondary | ICD-10-CM

## 2023-10-06 NOTE — Patient Instructions (Signed)
 Please come to [facility] for labs on Monday 02/07/2024 at 7:45am.

## 2023-10-06 NOTE — Progress Notes (Signed)
 Location:  [facility] Service:  Clinic (12)  Provider:   Code Status:  Goals of Care:     10/06/2023   10:48 AM  Advanced Directives  Does Patient Have a Medical Advance Directive? Yes  Type of Estate agent of [place];Living will;Out of facility DNR (pink MOST or yellow form)  Does patient want to make changes to medical advance directive? No - Patient declined  Copy of Healthcare Power of Attorney in Chart? Yes - validated most recent copy scanned in chart (See row information)     Chief Complaint  Patient presents with   Medical Management of Chronic Issues    4 month follow up. Discuss the need for Covid Booster.   Contract    Sign Contract     HPI: Patient is a 89 y.o. female seen today for medical management of chronic diseases.   [place]ives [place] in [facility] with no assist. No Falls Does not drive anymore Acute issue today Discussed the use of AI scribe software for clinical note transcription with the patient, who gave verbal consent to proceed.   History of Present Illness   Bilateral Edema Worsened few weeks ago with [facility] Cardiologist Changed Lasix  to 60 mg every day Which helped Back to her 40 mg dose now Legs are doing well   Pain in her Neck  Continues to use Oxycodone  PRN Itching  The patient also reports generalized itching,  Using Steroid cream as needed   Lack of energy chronic Issue The patient also reports a lack of energy and has been managing their daily activities with the help of their son and occasional housekeeping assistance.  Osteoporosis The patient has been prescribed Fosamax  but decided to discontinue it due to severe arthritic knee pain.     HOH Hearing aids did not help    Past Medical History:  Diagnosis Date   Atrial fibrillation (HCC)    Breast CA (HCC)    Cancer (HCC)    Breast and thyroid  cancer   Glaucoma    Hearing impaired    Hearing aids    Hypertension    Osteopenia 05/2013   T score -1.9 FRAX 15%/4.3%   Thyroid  disease    Cancer    Past Surgical History:  Procedure Laterality Date   APPENDECTOMY     BREAST EXCISIONAL BIOPSY Right    benign   BREAST LUMPECTOMY     [id number] radiation   BREAST SURGERY  1989   Lumpectomy right breast   CARDIOVERSION N/A 02/14/2016   Procedure: CARDIOVERSION;  Surgeon: Jacqueline Matsu, MD;  Location: [facility] ENDOSCOPY;  Service: Cardiovascular;  Laterality: N/A;   CARDIOVERSION N/A 08/04/2018   Procedure: CARDIOVERSION;  Surgeon: Sheryle Donning, MD;  Location: [facility] ENDOSCOPY;  Service: Cardiovascular;  Laterality: N/A;   CARDIOVERSION N/A 10/31/2019   Procedure: CARDIOVERSION;  Surgeon: Liza Riggers, MD;  Location: [facility] ENDOSCOPY;  Service: Cardiovascular;  Laterality: N/A;   CARDIOVERSION N/A 05/02/2020   Procedure: CARDIOVERSION;  Surgeon: Euell Herrlich, MD;  Location: [facility] ENDOSCOPY;  Service: Cardiovascular;  Laterality: N/A;   CARDIOVERSION N/A 09/10/2020   Procedure: CARDIOVERSION;  Surgeon: Jann Melody, MD;  Location: [facility] ENDOSCOPY;  Service: Cardiovascular;  Laterality: N/A;   Mole excised     Benign   THYROID  SURGERY     Removed 1 lobe   TONSILLECTOMY      Allergies  Allergen Reactions   Combigan [Brimonidine  Tartrate-Timolol] Itching and Other (See Comments)    Red eye    Pneumococcal Vaccines Other (See Comments)    Pain in arms/legs--nerve issues    Outpatient Encounter Medications as of 10/06/2023  Medication Sig   amiodarone  (PACERONE ) 200 MG tablet Take 1 tablet (200 mg total) by mouth daily.   apixaban  (ELIQUIS ) 2.5 MG TABS tablet Take 1 tablet (2.5 mg total) by mouth 2 (two) times daily.   beta carotene w/minerals (OCUVITE) tablet Take 1 tablet by mouth at bedtime.   Calcium  Citrate-Vitamin D  (CALCIUM  CITRATE + D3 PO) Take 1 tablet by mouth 2 (two) times daily.   carboxymethylcellulose (REFRESH PLUS) 0.5 % SOLN Place 1 drop into both eyes 3  (three) times daily as needed (dry/irritated eyes.).    cholecalciferol  (VITAMIN D ) 25 MCG (1000 UNIT) tablet Take 1,000 Units by mouth in the morning. chewables   diclofenac  Sodium (VOLTAREN ) 1 % GEL Apply 2 g topically 4 (four) times daily.   diltiazem  (CARDIZEM  CD) 240 MG 24 hr capsule Take 1 capsule (240 mg total) by mouth daily.   Emollient (AQUAPHOR ADVANCED THERAPY) OINT Apply 1 Application topically 2 (two) times daily as needed.   furosemide  (LASIX ) 20 MG tablet Take 40 mg by mouth daily.   glucosamine-chondroitin 500-400 MG tablet Take 1 tablet by mouth in the morning and at bedtime.   latanoprost  (XALATAN ) 0.005 % ophthalmic solution Place 1 drop into both eyes at bedtime.    latanoprost  (XALATAN ) 0.005 % ophthalmic solution Place 1 drop into both eyes at bedtime.   levothyroxine  (SYNTHROID ) 100 MCG tablet Take 1 tablet (100 mcg total) by mouth daily before breakfast.   Multiple Vitamin (MULTIVITAMIN WITH MINERALS) TABS tablet Take 1 tablet by mouth daily with lunch. Adults 50+   oxyCODONE  (OXY IR/ROXICODONE ) 5 MG immediate release tablet Take 1 tablet (5 mg total) by mouth 2 (two) times daily.   Polyethylene Glycol 400 (BLINK TEARS) 0.25 % SOLN Place 1-2 drops into both eyes 3 (three) times daily as needed (dry/irritated eyes).   potassium chloride  (KLOR-CON ) 10 MEQ tablet Take 10 mEq by mouth daily.   sacubitril -valsartan  (ENTRESTO ) 24-26 MG Take 1 tablet by mouth 2 (two) times daily.   triamcinolone  cream (KENALOG ) 0.1 % Apply to the affected areas twice daily for up to 2 weeks at a time. Avoid application to the face, groin, armpits, and skin folds   Emollient (AQUAPHOR ADVANCED THERAPY) OINT Apply 1 applicator topically 2 (two) times daily. (Patient not taking: Reported on 10/06/2023)   furosemide  (LASIX ) 20 MG tablet Take 3 tablets (60 mg total) by mouth daily. (Patient not taking: Reported on 10/06/2023)   potassium chloride  (KLOR-CON ) 10 MEQ tablet Take 2 tablets (20 mEq total) by  mouth daily. (Patient not taking: Reported on 10/06/2023)   No facility-administered encounter medications on file as of 10/06/2023.    Review of Systems:  Review of Systems  Constitutional:  Negative for activity change and appetite change.  HENT: Negative.    Respiratory:  Negative for cough and shortness of breath.   Cardiovascular:  Positive for leg swelling.  Gastrointestinal:  Negative for constipation.  Genitourinary: Negative.   Musculoskeletal:  Positive for gait problem, neck pain and neck stiffness. Negative for arthralgias and myalgias.  Skin: Negative.   Neurological:  Negative for dizziness and weakness.  Psychiatric/Behavioral:  Positive for dysphoric mood. Negative for confusion and sleep disturbance.     Health Maintenance  Topic Date Due   COVID-19 Vaccine (8 - 2024-25 season)  06/07/2023   Medicare Annual Wellness (AWV)  10/26/2023   INFLUENZA VACCINE  12/24/2023   DEXA SCAN  Completed   HPV VACCINES  Aged Out   Meningococcal B Vaccine  Aged Out   DTaP/Tdap/Td  Discontinued   Pneumonia Vaccine 75+ Years old  Discontinued   Zoster Vaccines- Shingrix  Discontinued    Physical Exam: Vitals:   10/06/23 1038  BP: 106/60  Pulse: 71  Resp: 16  Temp: 97.7 F (36.5 C)  SpO2: 96%  Weight: 128 lb (58.1 kg)  Height: 5\' 4"  (1.626 m)   Body mass index is 21.97 kg/m. Physical Exam Vitals reviewed.  Constitutional:      Appearance: Normal appearance.  HENT:     Head: Normocephalic.     Nose: Nose normal.     Mouth/Throat:     Mouth: Mucous membranes are moist.     Pharynx: Oropharynx is clear.  Eyes:     Pupils: Pupils are equal, round, and reactive to light.  Cardiovascular:     Rate and Rhythm: Normal rate and regular rhythm.     Pulses: Normal pulses.     Heart sounds: Normal heart sounds. No murmur heard. Pulmonary:     Effort: Pulmonary effort is normal.     Breath sounds: Normal breath sounds.  Abdominal:     General: Abdomen is flat. Bowel  sounds are normal.     Palpations: Abdomen is soft.  Musculoskeletal:        General: Swelling present.     Cervical back: Neck supple.     Comments: Mild Edema with Chronic Venous changes  Skin:    General: Skin is warm.  Neurological:     General: No focal deficit present.     Mental Status: She is alert.  Psychiatric:        Mood and Affect: Mood normal.        Thought Content: Thought content normal.     Labs reviewed: Basic Metabolic Panel: Recent Labs    02/01/23 0810 03/25/23 0944 03/25/23 0944 05/25/23 1223 06/03/23 0756 09/06/23 1028  NA  --  140   < > 143 137 140  K  --  4.1   < > 4.2 3.8 4.1  CL  --  103   < > 103 102 101  CO2  --  29   < > 21 27 27   GLUCOSE  --  83   < > 85 104* 83  BUN  --  24   < > 22 16 23   CREATININE  --  1.06*   < > 1.02* 0.96* 0.98  CALCIUM   --  8.5*   < > 8.7 8.6 9.0  TSH 4.67* 3.660  --   --  8.13*  --    < > = values in this interval not displayed.   Liver Function Tests: Recent Labs    03/25/23 0944 06/03/23 0756  AST 26 22  ALT 22 29  ALKPHOS 77  --   BILITOT 0.3 0.4  PROT 6.4 6.5  ALBUMIN 3.6*  --    No results for input(s): "LIPASE", "AMYLASE" in the last 8760 hours. No results for input(s): "AMMONIA" in the last 8760 hours. CBC: Recent Labs    03/25/23 0944 06/03/23 0756  WBC 5.9 8.7  NEUTROABS  --  5,603  HGB 11.8 12.7  HCT 36.8 40.3  MCV 90 89.2  PLT CANCELED CANCELED   Lipid Panel: Recent Labs    06/03/23 0756  CHOL 151  HDL 58  LDLCALC 78  TRIG 72  CHOLHDL 2.6   No results found for: "HGBA1C"  Procedures since last visit: No results found.  Assessment/Plan 1. Chronic Neck Pain On Chronic Oxycodone   2. Chronic pain of right knee Saw Ortho Is going to follow with them  3. Age-related osteoporosis without current pathological fracture Did not tolerate Fosamax  Does not want anything else  4. Chronic systolic congestive heart failure (HCC) Entresto  and Lasix   5. Rash and nonspecific  skin eruption Uses Triamcinolone   6. Paroxysmal atrial fibrillation (HCC) Eliquis  and Amiodarone  and Cardizem   7. Mild cognitive impairment Man[place] well in [place]  8. Elevated TSH Repeat Will change dose if still high  9. Bilateral hearing loss, unspecified hearing loss type     Labs/tests ordered:  * No order type specified * Next appt:  02/09/2024

## 2023-10-19 ENCOUNTER — Other Ambulatory Visit: Payer: Self-pay

## 2023-10-19 ENCOUNTER — Other Ambulatory Visit (HOSPITAL_COMMUNITY): Payer: Self-pay

## 2023-10-19 ENCOUNTER — Other Ambulatory Visit: Payer: Self-pay | Admitting: Internal Medicine

## 2023-10-19 NOTE — Telephone Encounter (Signed)
 Patient is requesting a refill of the following medications: Requested Prescriptions   Pending Prescriptions Disp Refills   oxyCODONE  (OXY IR/ROXICODONE ) 5 MG immediate release tablet 60 tablet 0    Sig: Take 1 tablet (5 mg total) by mouth 2 (two) times daily.    Date of last refill: 08/23/2023  Refill amount: 60/0  Treatment agreement date: No up to date treatment agreement on file, notation made on pending appointment for 02/09/24

## 2023-10-20 ENCOUNTER — Other Ambulatory Visit (HOSPITAL_COMMUNITY): Payer: Self-pay

## 2023-10-20 ENCOUNTER — Other Ambulatory Visit: Payer: Self-pay

## 2023-10-20 MED ORDER — OXYCODONE HCL 5 MG PO TABS
5.0000 mg | ORAL_TABLET | Freq: Two times a day (BID) | ORAL | 0 refills | Status: DC
  Filled 2023-10-20: qty 60, 30d supply, fill #0

## 2023-10-25 ENCOUNTER — Other Ambulatory Visit: Payer: Self-pay | Admitting: Physician Assistant

## 2023-10-25 ENCOUNTER — Other Ambulatory Visit (HOSPITAL_COMMUNITY): Payer: Self-pay

## 2023-10-26 ENCOUNTER — Other Ambulatory Visit: Payer: Self-pay

## 2023-10-26 ENCOUNTER — Other Ambulatory Visit (HOSPITAL_COMMUNITY): Payer: Self-pay

## 2023-10-26 MED ORDER — FUROSEMIDE 20 MG PO TABS
60.0000 mg | ORAL_TABLET | Freq: Every day | ORAL | 3 refills | Status: DC
  Filled 2023-10-26: qty 270, 90d supply, fill #0

## 2023-11-08 ENCOUNTER — Other Ambulatory Visit (HOSPITAL_COMMUNITY): Payer: Self-pay

## 2023-11-08 ENCOUNTER — Other Ambulatory Visit: Payer: Self-pay

## 2023-11-15 ENCOUNTER — Other Ambulatory Visit: Payer: Self-pay | Admitting: Physician Assistant

## 2023-11-15 ENCOUNTER — Other Ambulatory Visit (HOSPITAL_COMMUNITY): Payer: Self-pay

## 2023-11-17 ENCOUNTER — Other Ambulatory Visit (HOSPITAL_COMMUNITY): Payer: Self-pay

## 2023-11-17 ENCOUNTER — Other Ambulatory Visit: Payer: Self-pay

## 2023-11-17 MED ORDER — DILTIAZEM HCL ER COATED BEADS 240 MG PO CP24
240.0000 mg | ORAL_CAPSULE | Freq: Every day | ORAL | 3 refills | Status: DC
Start: 2023-11-17 — End: 2024-01-11
  Filled 2023-11-17: qty 90, 90d supply, fill #0

## 2023-11-29 ENCOUNTER — Other Ambulatory Visit (HOSPITAL_COMMUNITY): Payer: Self-pay

## 2023-11-29 ENCOUNTER — Other Ambulatory Visit: Payer: Self-pay | Admitting: Internal Medicine

## 2023-11-29 NOTE — Telephone Encounter (Signed)
 Medication was discontinued for patient back in May of 2025 , would you still like medication to be refused ?

## 2023-12-03 ENCOUNTER — Other Ambulatory Visit (HOSPITAL_COMMUNITY): Payer: Self-pay

## 2023-12-03 ENCOUNTER — Other Ambulatory Visit: Payer: Self-pay

## 2023-12-03 ENCOUNTER — Other Ambulatory Visit: Payer: Self-pay | Admitting: Internal Medicine

## 2023-12-03 MED ORDER — LEVOTHYROXINE SODIUM 100 MCG PO TABS
100.0000 ug | ORAL_TABLET | Freq: Every day | ORAL | 1 refills | Status: DC
  Filled 2023-12-03: qty 90, 90d supply, fill #0
  Filled 2024-03-06: qty 90, 90d supply, fill #1

## 2023-12-10 ENCOUNTER — Other Ambulatory Visit (HOSPITAL_COMMUNITY): Payer: Self-pay

## 2023-12-13 ENCOUNTER — Other Ambulatory Visit: Payer: Self-pay | Admitting: Internal Medicine

## 2023-12-13 ENCOUNTER — Other Ambulatory Visit: Payer: Self-pay | Admitting: Physician Assistant

## 2023-12-13 ENCOUNTER — Other Ambulatory Visit (HOSPITAL_COMMUNITY): Payer: Self-pay

## 2023-12-13 NOTE — Telephone Encounter (Signed)
 Patient has request for refill on 12/13/2023. Patient last refill was in May 2025. Patient does not have a contract on file. Patient has upcoming appointment 02/09/2024. Updated contract/Sign contract was added to appointment notes. Medication pend and sent to PCP Zane, Fredia CROME, MD) for approval.

## 2023-12-14 ENCOUNTER — Other Ambulatory Visit (HOSPITAL_COMMUNITY): Payer: Self-pay

## 2023-12-14 ENCOUNTER — Other Ambulatory Visit: Payer: Self-pay

## 2023-12-14 MED ORDER — AMIODARONE HCL 200 MG PO TABS
200.0000 mg | ORAL_TABLET | Freq: Every day | ORAL | 2 refills | Status: AC
  Filled 2023-12-14: qty 90, 90d supply, fill #0
  Filled 2024-04-24: qty 90, 90d supply, fill #1

## 2023-12-15 ENCOUNTER — Other Ambulatory Visit (HOSPITAL_COMMUNITY): Payer: Self-pay

## 2023-12-15 ENCOUNTER — Other Ambulatory Visit: Payer: Self-pay

## 2023-12-15 MED ORDER — OXYCODONE HCL 5 MG PO TABS
5.0000 mg | ORAL_TABLET | Freq: Two times a day (BID) | ORAL | 0 refills | Status: DC
  Filled 2023-12-15: qty 60, 30d supply, fill #0

## 2024-01-10 ENCOUNTER — Other Ambulatory Visit: Payer: Self-pay
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
## 2024-01-11 ENCOUNTER — Ambulatory Visit: Admitting: Physician Assistant

## 2024-01-11 ENCOUNTER — Encounter: Payer: Self-pay | Admitting: Cardiology

## 2024-01-11 ENCOUNTER — Other Ambulatory Visit: Payer: Self-pay

## 2024-01-11 ENCOUNTER — Other Ambulatory Visit (HOSPITAL_COMMUNITY): Payer: Self-pay

## 2024-01-11 ENCOUNTER — Ambulatory Visit: Attending: Cardiology | Admitting: Cardiology

## 2024-01-11 VITALS — BP 102/60 | HR 65 | Ht 64.0 in | Wt 127.6 lb

## 2024-01-11 DIAGNOSIS — I4819 Other persistent atrial fibrillation: Secondary | ICD-10-CM | POA: Diagnosis not present

## 2024-01-11 DIAGNOSIS — Z7901 Long term (current) use of anticoagulants: Secondary | ICD-10-CM | POA: Insufficient documentation

## 2024-01-11 DIAGNOSIS — I5022 Chronic systolic (congestive) heart failure: Secondary | ICD-10-CM | POA: Insufficient documentation

## 2024-01-11 DIAGNOSIS — R600 Localized edema: Secondary | ICD-10-CM | POA: Diagnosis not present

## 2024-01-11 MED ORDER — DILTIAZEM HCL ER COATED BEADS 180 MG PO CP24
180.0000 mg | ORAL_CAPSULE | Freq: Every day | ORAL | 3 refills | Status: DC
  Filled 2024-01-11: qty 90, 90d supply, fill #0
  Filled 2024-04-03: qty 90, 90d supply, fill #1

## 2024-01-11 MED ORDER — FUROSEMIDE 20 MG PO TABS
60.0000 mg | ORAL_TABLET | Freq: Every day | ORAL | 3 refills | Status: AC
  Filled 2024-01-11: qty 270, 90d supply, fill #0
  Filled 2024-04-24: qty 270, 90d supply, fill #1

## 2024-01-11 NOTE — Patient Instructions (Signed)
 Medication Instructions:  Take Lasix  40 mg twice a day for the next 3 days then decrease back down to 60 mg once a day. Decrease Diltiazem  down to 180 mg once a day  *If you need a refill on your cardiac medications before your next appointment, please call your pharmacy*  Lab Work: Today we are going to draw a CBC, Cmet, BNP, and TSH If you have labs (blood work) drawn today and your tests are completely normal, you will receive your results only by: MyChart Message (if you have MyChart) OR A paper copy in the mail If you have any lab test that is abnormal or we need to change your treatment, we will call you to review the results.  Testing/Procedures: No testing  Follow-Up: At [facility], you and your health needs are our priority.  As part of our continuing mission to provide you with exceptional heart care, our providers are all part of one team.  This team includes your primary Cardiologist (physician) and Advanced Practice Providers or APPs (Physician Assistants and Nurse Practitioners) who all work together to provide you with the care you need, when you need it.  Your next appointment:   3 week(s)  Provider:   Katlyn West, NP, Then, Will Gladis Norton, MD will plan to see you again in 4 month(s).    We recommend signing up for the patient portal called MyChart.  Sign up information is provided on this After Visit Summary.  MyChart is used to connect with patients for Virtual Visits (Telemedicine).  Patients are able to view lab/test results, encounter notes, upcoming appointments, etc.  Non-urgent messages can be sent to your provider as well.   To learn more about what you can do with MyChart, go to [email or web address].

## 2024-01-12 ENCOUNTER — Ambulatory Visit: Payer: Self-pay | Admitting: Cardiology

## 2024-01-12 DIAGNOSIS — I5022 Chronic systolic (congestive) heart failure: Secondary | ICD-10-CM

## 2024-01-12 DIAGNOSIS — R600 Localized edema: Secondary | ICD-10-CM

## 2024-01-12 LAB — COMPREHENSIVE METABOLIC PANEL WITH GFR
ALT: 11 IU/L (ref 0–32)
AST: 23 IU/L (ref 0–40)
Albumin: 4.1 g/dL (ref 3.7–4.7)
Alkaline Phosphatase: 81 IU/L (ref 44–121)
BUN/Creatinine Ratio: 19 (ref 12–28)
BUN: 20 mg/dL (ref 8–27)
Bilirubin Total: 0.5 mg/dL (ref 0.0–1.2)
CO2: 24 mmol/L (ref 20–29)
Calcium: 8.9 mg/dL (ref 8.7–10.3)
Chloride: 99 mmol/L (ref 96–106)
Creatinine, Ser: 1.07 mg/dL — ABNORMAL HIGH (ref 0.57–1.00)
Globulin, Total: 3.2 g/dL (ref 1.5–4.5)
Glucose: 67 mg/dL — ABNORMAL LOW (ref 70–99)
Potassium: 4.3 mmol/L (ref 3.5–5.2)
Sodium: 142 mmol/L (ref 134–144)
Total Protein: 7.3 g/dL (ref 6.0–8.5)
eGFR: 50 mL/min/1.73 — ABNORMAL LOW (ref >59)

## 2024-01-12 LAB — CBC
Hematocrit: 40.4 % (ref 34.0–46.6)
Hemoglobin: 12.6 g/dL (ref 11.1–15.9)
MCH: 28.8 pg (ref 26.6–33.0)
MCHC: 31.2 g/dL — ABNORMAL LOW (ref 31.5–35.7)
MCV: 92 fL (ref 79–97)
RBC: 4.37 x10E6/uL (ref 3.77–5.28)
RDW: 14 % (ref 11.7–15.4)
WBC: 6 x10E3/uL (ref 3.4–10.8)

## 2024-01-12 LAB — TSH: TSH: 3.15 u[IU]/mL (ref 0.450–4.500)

## 2024-01-12 LAB — BRAIN NATRIURETIC PEPTIDE: BNP: 116.2 pg/mL — ABNORMAL HIGH (ref 0.0–100.0)

## 2024-01-14 NOTE — Telephone Encounter (Signed)
 Called patient advised of below they verbalized understanding.

## 2024-01-18 DIAGNOSIS — H353130 Nonexudative age-related macular degeneration, bilateral, stage unspecified: Secondary | ICD-10-CM | POA: Diagnosis not present

## 2024-01-18 DIAGNOSIS — H401223 Low-tension glaucoma, left eye, severe stage: Secondary | ICD-10-CM | POA: Diagnosis not present

## 2024-01-26 ENCOUNTER — Other Ambulatory Visit: Payer: Self-pay

## 2024-01-26 ENCOUNTER — Other Ambulatory Visit (HOSPITAL_COMMUNITY): Payer: Self-pay

## 2024-01-26 MED ORDER — LATANOPROST 0.005 % OP SOLN
1.0000 [drp] | Freq: Every day | OPHTHALMIC | 11 refills | Status: DC
  Filled 2024-01-26: qty 5, 50d supply, fill #0
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
## 2024-02-02 ENCOUNTER — Encounter: Payer: Self-pay | Admitting: Cardiology

## 2024-02-02 ENCOUNTER — Ambulatory Visit: Attending: Cardiology | Admitting: Cardiology

## 2024-02-02 VITALS — BP 110/60 | HR 60 | Ht 64.0 in | Wt 127.8 lb

## 2024-02-02 DIAGNOSIS — R600 Localized edema: Secondary | ICD-10-CM | POA: Insufficient documentation

## 2024-02-02 DIAGNOSIS — I5022 Chronic systolic (congestive) heart failure: Secondary | ICD-10-CM | POA: Diagnosis not present

## 2024-02-02 DIAGNOSIS — Z7901 Long term (current) use of anticoagulants: Secondary | ICD-10-CM | POA: Insufficient documentation

## 2024-02-02 DIAGNOSIS — I4819 Other persistent atrial fibrillation: Secondary | ICD-10-CM | POA: Diagnosis not present

## 2024-02-02 NOTE — Patient Instructions (Signed)
 Medication Instructions:  Your physician recommends that you continue on your current medications as directed. Please refer to the Current Medication list given to you today.  *If you need a refill on your cardiac medications before your next appointment, please call your pharmacy*  Lab Work: None ordered  If you have labs (blood work) drawn today and your tests are completely normal, you will receive your results only by: MyChart Message (if you have MyChart) OR A paper copy in the mail If you have any lab test that is abnormal or we need to change your treatment, we will call you to review the results.  Testing/Procedures: None ordered  Follow-Up: At [facility], you and your health needs are our priority.  As part of our continuing mission to provide you with exceptional heart care, our providers are all part of one team.  This team includes your primary Cardiologist (physician) and Advanced Practice Providers or APPs (Physician Assistants and Nurse Practitioners) who all work together to provide you with the care you need, when you need it.  Your next appointment:   As scheduled   Provider:   Will Gladis Norton, MD    We recommend signing up for the patient portal called MyChart.  Sign up information is provided on this After Visit Summary.  MyChart is used to connect with patients for Virtual Visits (Telemedicine).  Patients are able to view lab/test results, encounter notes, upcoming appointments, etc.  Non-urgent messages can be sent to your provider as well.   To learn more about what you can do with MyChart, go to [email or web address].   Other Instructions

## 2024-02-07 ENCOUNTER — Other Ambulatory Visit (HOSPITAL_BASED_OUTPATIENT_CLINIC_OR_DEPARTMENT_OTHER): Payer: Self-pay

## 2024-02-07 ENCOUNTER — Other Ambulatory Visit (HOSPITAL_COMMUNITY): Payer: Self-pay

## 2024-02-07 DIAGNOSIS — E039 Hypothyroidism, unspecified: Secondary | ICD-10-CM | POA: Diagnosis not present

## 2024-02-07 DIAGNOSIS — I5022 Chronic systolic (congestive) heart failure: Secondary | ICD-10-CM | POA: Diagnosis not present

## 2024-02-07 DIAGNOSIS — M810 Age-related osteoporosis without current pathological fracture: Secondary | ICD-10-CM | POA: Diagnosis not present

## 2024-02-07 DIAGNOSIS — R7989 Other specified abnormal findings of blood chemistry: Secondary | ICD-10-CM | POA: Diagnosis not present

## 2024-02-07 DIAGNOSIS — I480 Paroxysmal atrial fibrillation: Secondary | ICD-10-CM | POA: Diagnosis not present

## 2024-02-08 LAB — CBC WITH DIFFERENTIAL/PLATELET
Absolute Lymphocytes: 1333 {cells}/uL (ref 850–3900)
Absolute Monocytes: 958 {cells}/uL — ABNORMAL HIGH (ref 200–950)
Basophils Absolute: 40 {cells}/uL (ref 0–200)
Basophils Relative: 0.6 %
Eosinophils Absolute: 147 {cells}/uL (ref 15–500)
Eosinophils Relative: 2.2 %
HCT: 39.8 % (ref 35.0–45.0)
Hemoglobin: 12.7 g/dL (ref 11.7–15.5)
MCH: 28.5 pg (ref 27.0–33.0)
MCHC: 31.9 g/dL — ABNORMAL LOW (ref 32.0–36.0)
MCV: 89.4 fL (ref 80.0–100.0)
MPV: 11.8 fL (ref 7.5–12.5)
Monocytes Relative: 14.3 %
Neutro Abs: 4221 {cells}/uL (ref 1500–7800)
Neutrophils Relative %: 63 %
RBC: 4.45 Million/uL (ref 3.80–5.10)
RDW: 13.3 % (ref 11.0–15.0)
Total Lymphocyte: 19.9 %
WBC: 6.7 Thousand/uL (ref 3.8–10.8)

## 2024-02-08 LAB — LIPID PANEL
Cholesterol: 187 mg/dL (ref <200)
HDL: 77 mg/dL (ref >=50)
LDL Cholesterol (Calc): 96 mg/dL
Non-HDL Cholesterol (Calc): 110 mg/dL (ref <130)
Total CHOL/HDL Ratio: 2.4 (calc) (ref <5.0)
Triglycerides: 57 mg/dL (ref <150)

## 2024-02-08 LAB — COMPLETE METABOLIC PANEL WITHOUT GFR
AG Ratio: 1.2 (calc) (ref 1.0–2.5)
ALT: 11 U/L (ref 6–29)
AST: 18 U/L (ref 10–35)
Albumin: 3.8 g/dL (ref 3.6–5.1)
Alkaline phosphatase (APISO): 67 U/L (ref 37–153)
BUN/Creatinine Ratio: 25 (calc) — ABNORMAL HIGH (ref 6–22)
BUN: 25 mg/dL (ref 7–25)
CO2: 33 mmol/L — ABNORMAL HIGH (ref 20–32)
Calcium: 8.8 mg/dL (ref 8.6–10.4)
Chloride: 100 mmol/L (ref 98–110)
Creat: 0.99 mg/dL — ABNORMAL HIGH (ref 0.60–0.95)
Globulin: 3.2 g/dL (ref 1.9–3.7)
Glucose, Bld: 73 mg/dL (ref 65–99)
Potassium: 3.8 mmol/L (ref 3.5–5.3)
Sodium: 140 mmol/L (ref 135–146)
Total Bilirubin: 0.4 mg/dL (ref 0.2–1.2)
Total Protein: 7 g/dL (ref 6.1–8.1)

## 2024-02-08 LAB — TSH: TSH: 2.87 m[IU]/L (ref 0.40–4.50)

## 2024-02-09 ENCOUNTER — Encounter: Payer: Self-pay | Admitting: Internal Medicine

## 2024-02-09 ENCOUNTER — Other Ambulatory Visit: Payer: Self-pay

## 2024-02-09 ENCOUNTER — Other Ambulatory Visit (HOSPITAL_COMMUNITY): Payer: Self-pay

## 2024-02-09 ENCOUNTER — Non-Acute Institutional Stay: Admitting: Internal Medicine

## 2024-02-09 VITALS — BP 126/86 | HR 63 | Temp 97.1°F | Resp 18 | Ht 64.0 in | Wt 127.1 lb

## 2024-02-09 DIAGNOSIS — M810 Age-related osteoporosis without current pathological fracture: Secondary | ICD-10-CM | POA: Diagnosis not present

## 2024-02-09 DIAGNOSIS — H9193 Unspecified hearing loss, bilateral: Secondary | ICD-10-CM | POA: Diagnosis not present

## 2024-02-09 DIAGNOSIS — I5022 Chronic systolic (congestive) heart failure: Secondary | ICD-10-CM

## 2024-02-09 DIAGNOSIS — G3184 Mild cognitive impairment, so stated: Secondary | ICD-10-CM | POA: Diagnosis not present

## 2024-02-09 DIAGNOSIS — I480 Paroxysmal atrial fibrillation: Secondary | ICD-10-CM

## 2024-02-09 DIAGNOSIS — E039 Hypothyroidism, unspecified: Secondary | ICD-10-CM

## 2024-02-09 MED ORDER — POTASSIUM CHLORIDE CRYS ER 10 MEQ PO TBCR
10.0000 meq | EXTENDED_RELEASE_TABLET | Freq: Every day | ORAL | 3 refills | Status: AC
  Filled 2024-02-09: qty 90, 90d supply, fill #0
  Filled 2024-05-01: qty 90, 90d supply, fill #1

## 2024-02-09 MED ORDER — COVID-19 MRNA VACC (MODERNA) 50 MCG/0.5ML IM SUSY
0.5000 mL | PREFILLED_SYRINGE | INTRAMUSCULAR | 1 refills | Status: DC
  Filled 2024-02-09: qty 0.5, fill #0

## 2024-02-09 NOTE — Patient Instructions (Addendum)
 1) Our records indicate you are due for an annual wellness visit, please call the office at [facility] at [phone] to schedule (video or in-person).    2) Please visit your local pharmacy to receive your Covid vaccine, if you have not already received.    3) Labs will be done on  June 06, 2023 at [facility] at 7:45 am.

## 2024-02-09 NOTE — Progress Notes (Signed)
 Location:  [facility] of Service:  Clinic (12)  Provider:   Code Status: DNR Goals of Care:     10/06/2023   10:48 AM  Advanced Directives  Does Patient Have a Medical Advance Directive? Yes  Type of Estate agent of [place];Living will;Out of facility DNR (pink MOST or yellow form)  Does patient want to make changes to medical advance directive? No - Patient declined  Copy of Healthcare Power of Attorney in Chart? Yes - validated most recent copy scanned in chart (See row information)     Chief Complaint  Patient presents with   Medical Management of Chronic Issues    4 month follow up, Spoke with patient to go over care caps as follows, medicare annual wellness visit, flu and Covid vaccine    HPI: Patient is a 89 y.o. female seen today for medical management of chronic diseases.    Lives in [place] in [facility] with no assist. No Falls Does not drive anymore Acute issue today Discussed the use of AI scribe software for clinical note transcription with the patient, who gave verbal consent to proceed.   Discussed the use of AI scribe software for clinical note transcription with the patient, who gave verbal consent to proceed.  History of Present Illness   Laura Lee is an 89 year old female who presents for management of her diuretic therapy.  She experiences intermittent leg swelling and fluid retention, managed with furosemide . She typically takes three tablets but occasionally takes four when experiencing daily weeping of the legs.   Extra furosemide  leads to dehydration, causing loose rings, dry mouth, and reduced leg size, affecting her elastic stockings. Despite these side effects, she continues to adjust her furosemide  dosage based on swelling.  She recently visited her cardiologist for blood work to monitor electrolytes and kidney function. She consumes a banana daily for potassium and takes one potassium tablet,  adjusting intake for events like family reunions.  She uses oxycodone  nightly for neck pain, which is effective. She experiences occasional dizziness and uses a walker for long distances. Her appetite is good, and she has regular bowel movements. She wears hearing aids but has difficulty hearing.      Past Medical History:  Diagnosis Date   Atrial fibrillation (HCC)    Breast CA (HCC)    Cancer (HCC)    Breast and thyroid  cancer   Glaucoma    Hearing impaired    Hearing aids   Hypertension    Osteopenia 05/2013   T score -1.9 FRAX 15%/4.3%   Thyroid  disease    Cancer    Past Surgical History:  Procedure Laterality Date   APPENDECTOMY     BREAST EXCISIONAL BIOPSY Right    benign   BREAST LUMPECTOMY     [id number] radiation   BREAST SURGERY  1989   Lumpectomy right breast   CARDIOVERSION N/A 02/14/2016   Procedure: CARDIOVERSION;  Surgeon: Wilbert JONELLE Bihari, MD;  Location: [facility] ENDOSCOPY;  Service: Cardiovascular;  Laterality: N/A;   CARDIOVERSION N/A 08/04/2018   Procedure: CARDIOVERSION;  Surgeon: Lonni Slain, MD;  Location: [facility] ENDOSCOPY;  Service: Cardiovascular;  Laterality: N/A;   CARDIOVERSION N/A 10/31/2019   Procedure: CARDIOVERSION;  Surgeon: Maranda Leim DEL, MD;  Location: [facility] ENDOSCOPY;  Service: Cardiovascular;  Laterality: N/A;   CARDIOVERSION N/A 05/02/2020   Procedure: CARDIOVERSION;  Surgeon: Loni Soyla LABOR, MD;  Location: [facility] ENDOSCOPY;  Service: Cardiovascular;  Laterality: N/A;   CARDIOVERSION N/A 09/10/2020   Procedure: CARDIOVERSION;  Surgeon: Santo Stanly LABOR, MD;  Location: [facility] ENDOSCOPY;  Service: Cardiovascular;  Laterality: N/A;   Mole excised     Benign   THYROID  SURGERY     Removed 1 lobe   TONSILLECTOMY      Allergies  Allergen Reactions   Combigan [Brimonidine Tartrate-Timolol] Itching and Other (See Comments)    Red eye    Pneumococcal Vaccines Other (See Comments)    Pain in arms/legs--nerve issues    Outpatient Encounter  Medications as of 02/09/2024  Medication Sig   amiodarone  (PACERONE ) 200 MG tablet Take 1 tablet (200 mg total) by mouth daily.   apixaban  (ELIQUIS ) 2.5 MG TABS tablet Take 1 tablet (2.5 mg total) by mouth 2 (two) times daily.   beta carotene w/minerals (OCUVITE) tablet Take 1 tablet by mouth at bedtime.   Calcium  Citrate-Vitamin D  (CALCIUM  CITRATE + D3 PO) Take 1 tablet by mouth 2 (two) times daily.   carboxymethylcellulose (REFRESH PLUS) 0.5 % SOLN Place 1 drop into both eyes 3 (three) times daily as needed (dry/irritated eyes.).    cholecalciferol  (VITAMIN D ) 25 MCG (1000 UNIT) tablet Take 1,000 Units by mouth in the morning. chewables   COVID-19 mRNA vaccine (SPIKEVAX) syringe Inject 0.5 mLs into the muscle as directed.   diclofenac  Sodium (VOLTAREN ) 1 % GEL Apply 2 g topically 4 (four) times daily.   diltiazem  (CARDIZEM  CD) 180 MG 24 hr capsule Take 1 capsule (180 mg total) by mouth daily.   Emollient (AQUAPHOR ADVANCED THERAPY) OINT Apply 1 Application topically 2 (two) times daily as needed.   furosemide  (LASIX ) 20 MG tablet Take 3 tablets (60 mg total) by mouth daily. For the next 3 days take Lasix  40 mg twice a day, then continue with 60 mg once a day in the morning.   glucosamine-chondroitin 500-400 MG tablet Take 1 tablet by mouth in the morning and at bedtime.   latanoprost  (XALATAN ) 0.005 % ophthalmic solution Place 1 drop into both eyes at bedtime.    levothyroxine  (SYNTHROID ) 100 MCG tablet Take 1 tablet (100 mcg total) by mouth daily before breakfast.   Multiple Vitamin (MULTIVITAMIN WITH MINERALS) TABS tablet Take 1 tablet by mouth daily with lunch. Adults 50+   oxyCODONE  (OXY IR/ROXICODONE ) 5 MG immediate release tablet Take 1 tablet (5 mg total) by mouth 2 (two) times daily.   Polyethylene Glycol 400 (BLINK TEARS) 0.25 % SOLN Place 1-2 drops into both eyes 3 (three) times daily as needed (dry/irritated eyes).   potassium chloride  (KLOR-CON  M) 10 MEQ tablet Take 1 tablet (10 mEq  total) by mouth daily.   sacubitril -valsartan  (ENTRESTO ) 24-26 MG Take 1 tablet by mouth 2 (two) times daily.   triamcinolone  cream (KENALOG ) 0.1 % Apply to the affected areas twice daily for up to 2 weeks at a time. Avoid application to the face, groin, armpits, and skin folds   latanoprost  (XALATAN ) 0.005 % ophthalmic solution Place 1 drop into both eyes at bedtime. (Patient not taking: Reported on 02/09/2024)   [DISCONTINUED] potassium chloride  (KLOR-CON ) 10 MEQ tablet Take 10 mEq by mouth daily. (Patient not taking: Reported on 02/09/2024)   No facility-administered encounter medications on file as of 02/09/2024.    Review of Systems:  Review of Systems  Constitutional:  Negative for activity change and appetite change.  HENT: Negative.    Respiratory:  Negative for cough and shortness of breath.   Cardiovascular:  Positive for leg swelling.  Gastrointestinal:  Negative for constipation.  Genitourinary: Negative.   Musculoskeletal:  Positive for gait problem and neck pain. Negative for arthralgias and myalgias.  Skin: Negative.   Neurological:  Negative for dizziness and weakness.  Psychiatric/Behavioral:  Negative for confusion, dysphoric mood and sleep disturbance.     Health Maintenance  Topic Date Due   Medicare Annual Wellness (AWV)  10/26/2023   Influenza Vaccine  12/24/2023   COVID-19 Vaccine (8 - 2025-26 season) 01/24/2024   DEXA SCAN  Completed   HPV VACCINES  Aged Out   Meningococcal B Vaccine  Aged Out   DTaP/Tdap/Td  Discontinued   Pneumococcal Vaccine: 50+ Years  Discontinued   Zoster Vaccines- Shingrix  Discontinued    Physical Exam: Vitals:   02/09/24 1049  BP: 126/86  Pulse: 63  Resp: 18  Temp: (!) 97.1 F (36.2 C)  SpO2: 96%  Weight: 127 lb 1.6 oz (57.7 kg)  Height: 5' 4 (1.626 m)   Body mass index is 21.82 kg/m. Physical Exam Vitals reviewed.  Constitutional:      Appearance: Normal appearance.  HENT:     Head: Normocephalic.     Nose: Nose  normal.     Mouth/Throat:     Mouth: Mucous membranes are moist.     Pharynx: Oropharynx is clear.  Eyes:     Pupils: Pupils are equal, round, and reactive to light.  Cardiovascular:     Rate and Rhythm: Normal rate and regular rhythm.     Pulses: Normal pulses.     Heart sounds: Normal heart sounds. No murmur heard. Pulmonary:     Effort: Pulmonary effort is normal.     Breath sounds: Normal breath sounds.  Abdominal:     General: Abdomen is flat. Bowel sounds are normal.     Palpations: Abdomen is soft.  Musculoskeletal:        General: Swelling present.     Cervical back: Neck supple.     Comments: With Chronic venous changes  Skin:    General: Skin is warm.  Neurological:     General: No focal deficit present.     Mental Status: She is alert and oriented to person, place, and time.  Psychiatric:        Mood and Affect: Mood normal.        Thought Content: Thought content normal.     Labs reviewed: Basic Metabolic Panel: Recent Labs    06/03/23 0756 09/06/23 1028 01/11/24 0945 02/07/24 0800  NA 137 140 142 140  K 3.8 4.1 4.3 3.8  CL 102 101 99 100  CO2 27 27 24  33*  GLUCOSE 104* 83 67* 73  BUN 16 23 20 25   CREATININE 0.96* 0.98 1.07* 0.99*  CALCIUM  8.6 9.0 8.9 8.8  TSH 8.13*  --  3.150 2.87   Liver Function Tests: Recent Labs    03/25/23 0944 06/03/23 0756 01/11/24 0945 02/07/24 0800  AST 26 22 23 18   ALT 22 29 11 11   ALKPHOS 77  --  81  --   BILITOT 0.3 0.4 0.5 0.4  PROT 6.4 6.5 7.3 7.0  ALBUMIN 3.6*  --  4.1  --    No results for input(s): LIPASE, AMYLASE in the last 8760 hours. No results for input(s): AMMONIA in the last 8760 hours. CBC: Recent Labs    03/25/23 0944 06/03/23 0756 01/11/24 0945 02/07/24 0800  WBC 5.9 8.7 6.0 6.7  NEUTROABS  --  5,603  --  4,221  HGB 11.8  12.7 12.6 12.7  HCT 36.8 40.3 40.4 39.8  MCV 90 89.2 92 89.4  PLT CANCELED CANCELED CANCELED  --    Lipid Panel: Recent Labs    06/03/23 0756  02/07/24 0800  CHOL 151 187  HDL 58 77  LDLCALC 78 96  TRIG 72 57  CHOLHDL 2.6 2.4   No results found for: HGBA1C  Procedures since last visit: No results found.  Assessment/Plan 1. Chronic systolic congestive heart failure (HCC) (Primary) On 60 mg now Can safely take 80 mg as needed for her LE edema added Potassium 10 meq as her potasium is 3.8 On Entresto  also 2. Paroxysmal atrial fibrillation (HCC) Eliquis  Amiodarone  and Cardizem  3. Mild cognitive impairment Manages weill in [place]  4. Bilateral hearing loss, unspecified hearing loss type   5. Acquired hypothyroidism TSH normal  6. Age-related osteoporosis without current pathological fracture Did not tolerate Fosamax  Does not want anything else   7 Hypokalemia Hypokalemia due to diuretics, potassium at 3.8. Plan to maintain levels around 4 with supplementation. - Prescribe potassium supplement, one tablet daily. - Advise two tablets on days with increased Lasix  dose  8 Chronic neck pain Neck pain managed with nightly oxycodone , effective control.  9 Dizziness Intermittent dizziness managed with walker for long distances.  Follow-Up Follow-up plan for continuity of care and monitoring. - Schedule follow-up in four months. - Coordinate with cardiology for potential blood work.       Labs/tests ordered:  * No order type specified * Next appt:  Visit date not found

## 2024-02-14 ENCOUNTER — Other Ambulatory Visit: Payer: Self-pay | Admitting: Internal Medicine

## 2024-02-14 ENCOUNTER — Other Ambulatory Visit (HOSPITAL_COMMUNITY): Payer: Self-pay

## 2024-02-14 NOTE — Telephone Encounter (Signed)
 Controlled substance , was last filled in July.

## 2024-02-15 ENCOUNTER — Other Ambulatory Visit (HOSPITAL_COMMUNITY): Payer: Self-pay

## 2024-02-15 MED ORDER — OXYCODONE HCL 5 MG PO TABS
5.0000 mg | ORAL_TABLET | Freq: Two times a day (BID) | ORAL | 0 refills | Status: DC
  Filled 2024-02-15: qty 60, 30d supply, fill #0

## 2024-02-16 ENCOUNTER — Other Ambulatory Visit (HOSPITAL_COMMUNITY): Payer: Self-pay

## 2024-02-16 ENCOUNTER — Other Ambulatory Visit: Payer: Self-pay

## 2024-02-16 ENCOUNTER — Telehealth (HOSPITAL_COMMUNITY): Payer: Self-pay

## 2024-02-21 ENCOUNTER — Encounter: Admitting: Orthopedic Surgery

## 2024-02-21 NOTE — Progress Notes (Signed)
 This encounter was created in error - please disregard.

## 2024-02-23 ENCOUNTER — Encounter: Admitting: Internal Medicine

## 2024-03-01 DIAGNOSIS — Z23 Encounter for immunization: Secondary | ICD-10-CM | POA: Diagnosis not present

## 2024-03-02 ENCOUNTER — Non-Acute Institutional Stay: Admitting: Nurse Practitioner

## 2024-03-02 ENCOUNTER — Encounter: Payer: Self-pay | Admitting: Nurse Practitioner

## 2024-03-02 VITALS — BP 122/78 | HR 63 | Temp 98.6°F | Ht 64.0 in | Wt 126.0 lb

## 2024-03-02 DIAGNOSIS — Z0000 Encounter for general adult medical examination without abnormal findings: Secondary | ICD-10-CM | POA: Diagnosis not present

## 2024-03-02 DIAGNOSIS — Z66 Do not resuscitate: Secondary | ICD-10-CM | POA: Diagnosis not present

## 2024-03-02 NOTE — Progress Notes (Unsigned)
 Subjective:   Laura Lee is a 89 y.o. female who presents for Medicare Annual (Subsequent) preventive examination clinic [facility]  Visit Complete: In person  Patient Medicare AWV questionnaire was completed by the patient on 03/02/24; I have confirmed that all information answered by patient is correct and no changes since this date.  Cardiac Risk Factors include: advanced age (>64men, >45 women);dyslipidemia;hypertension     Objective:    Today's Vitals   03/02/24 1125 03/02/24 1513  BP: 122/78   Pulse: 63   Temp: 98.6 F (37 C)   TempSrc: Temporal   SpO2: 97%   Weight: 126 lb (57.2 kg)   Height: 5' 4 (1.626 m)   PainSc:  0-No pain   Body mass index is 21.63 kg/m.     03/01/2024   12:01 PM 10/06/2023   10:48 AM 06/09/2023   10:30 AM 02/15/2023    1:17 PM 02/03/2023    9:57 AM 10/26/2022    1:06 PM 09/16/2022   10:15 AM  Advanced Directives  Does Patient Have a Medical Advance Directive? Yes Yes Yes Yes Yes Yes Yes  Type of Estate agent of [place];Living will;Out of facility DNR (pink MOST or yellow form) Healthcare Power of [place];Living will;Out of facility DNR (pink MOST or yellow form) Healthcare Power of [place];Living will;Out of facility DNR (pink MOST or yellow form) Healthcare Power of [place];Living will;Out of facility DNR (pink MOST or yellow form) Healthcare Power of [place];Living will;Out of facility DNR (pink MOST or yellow form) Healthcare Power of [place];Living will;Out of facility DNR (pink MOST or yellow form) Healthcare Power of [place];Living will;Out of facility DNR (pink MOST or yellow form)  Does patient want to make changes to medical advance directive? No - Patient declined No - Patient declined No - Patient declined No - Patient declined No - Patient declined No - Patient declined No - Patient declined  Copy of Healthcare Power of Attorney in Chart? Yes - validated most recent copy scanned in chart (See row information) Yes  - validated most recent copy scanned in chart (See row information) Yes - validated most recent copy scanned in chart (See row information) Yes - validated most recent copy scanned in chart (See row information) Yes - validated most recent copy scanned in chart (See row information) Yes - validated most recent copy scanned in chart (See row information) Yes - validated most recent copy scanned in chart (See row information)  Pre-existing out of facility DNR order (yellow form or pink MOST form) Yellow form placed in chart (order not valid for inpatient use)    Yellow form placed in chart (order not valid for inpatient use)      Current Medications (verified) Outpatient Encounter Medications as of 03/02/2024  Medication Sig   amiodarone  (PACERONE ) 200 MG tablet Take 1 tablet (200 mg total) by mouth daily.   apixaban  (ELIQUIS ) 2.5 MG TABS tablet Take 1 tablet (2.5 mg total) by mouth 2 (two) times daily.   beta carotene w/minerals (OCUVITE) tablet Take 1 tablet by mouth at bedtime.   Calcium  Citrate-Vitamin D  (CALCIUM  CITRATE + D3 PO) Take 1 tablet by mouth 2 (two) times daily.   carboxymethylcellulose (REFRESH PLUS) 0.5 % SOLN Place 1 drop into both eyes 3 (three) times daily as needed (dry/irritated eyes.).    cholecalciferol  (VITAMIN D ) 25 MCG (1000 UNIT) tablet Take 1,000 Units by mouth in the morning. chewables   diclofenac  Sodium (VOLTAREN ) 1 % GEL Apply 2 g topically 4 (  four) times daily.   diltiazem  (CARDIZEM  CD) 180 MG 24 hr capsule Take 1 capsule (180 mg total) by mouth daily.   Emollient (AQUAPHOR ADVANCED THERAPY) OINT Apply 1 Application topically 2 (two) times daily as needed.   furosemide  (LASIX ) 20 MG tablet Take 3 tablets (60 mg total) by mouth daily. For the next 3 days take Lasix  40 mg twice a day, then continue with 60 mg once a day in the morning.   glucosamine-chondroitin 500-400 MG tablet Take 1 tablet by mouth in the morning and at bedtime.   latanoprost  (XALATAN ) 0.005 %  ophthalmic solution Place 1 drop into both eyes at bedtime.    levothyroxine  (SYNTHROID ) 100 MCG tablet Take 1 tablet (100 mcg total) by mouth daily before breakfast.   Multiple Vitamin (MULTIVITAMIN WITH MINERALS) TABS tablet Take 1 tablet by mouth daily with lunch. Adults 50+   oxyCODONE  (OXY IR/ROXICODONE ) 5 MG immediate release tablet Take 1 tablet (5 mg total) by mouth 2 (two) times daily.   Polyethylene Glycol 400 (BLINK TEARS) 0.25 % SOLN Place 1-2 drops into both eyes 3 (three) times daily as needed (dry/irritated eyes).   potassium chloride  (KLOR-CON  M) 10 MEQ tablet Take 1 tablet (10 mEq total) by mouth daily.   sacubitril -valsartan  (ENTRESTO ) 24-26 MG Take 1 tablet by mouth 2 (two) times daily.   triamcinolone  cream (KENALOG ) 0.1 % Apply to the affected areas twice daily for up to 2 weeks at a time. Avoid application to the face, groin, armpits, and skin folds   COVID-19 mRNA vaccine (SPIKEVAX) syringe Inject 0.5 mLs into the muscle as directed.   [DISCONTINUED] latanoprost  (XALATAN ) 0.005 % ophthalmic solution Place 1 drop into both eyes at bedtime. (Patient not taking: Reported on 02/09/2024)   No facility-administered encounter medications on file as of 03/02/2024.    Allergies (verified) Combigan [brimonidine tartrate-timolol] and Pneumococcal vaccines   History: Past Medical History:  Diagnosis Date   Atrial fibrillation (HCC)    Breast CA (HCC)    Cancer (HCC)    Breast and thyroid  cancer   Glaucoma    Hearing impaired    Hearing aids   Hypertension    Osteopenia 05/2013   T score -1.9 FRAX 15%/4.3%   Thyroid  disease    Cancer   Past Surgical History:  Procedure Laterality Date   APPENDECTOMY     BREAST EXCISIONAL BIOPSY Right    benign   BREAST LUMPECTOMY     [id number] radiation   BREAST SURGERY  1989   Lumpectomy right breast   CARDIOVERSION N/A 02/14/2016   Procedure: CARDIOVERSION;  Surgeon: Wilbert JONELLE Bihari, MD;  Location: [facility] ENDOSCOPY;  Service:  Cardiovascular;  Laterality: N/A;   CARDIOVERSION N/A 08/04/2018   Procedure: CARDIOVERSION;  Surgeon: Lonni Slain, MD;  Location[facility] ENDOSCOPY;  Service: Cardiovascular;  Laterality: N/A;   CARDIOVERSION N/A 10/31/2019   Procedure: CARDIOVERSION;  Surgeon: Maranda Leim DEL, MD;  Location: [facility] ENDOSCOPY;  Service: Cardiovascular;  Laterality: N/A;   CARDIOVERSION N/A 05/02/2020   Procedure: CARDIOVERSION;  Surgeon: Loni Soyla LABOR, MD;  Location: [facility] ENDOSCOPY;  Service: Cardiovascular;  Laterality: N/A;   CARDIOVERSION N/A 09/10/2020   Procedure: CARDIOVERSION;  Surgeon: Santo Stanly LABOR, MD;  Location: [facility] ENDOSCOPY;  Service: Cardiovascular;  Laterality: N/A;   Mole excised     Benign   THYROID  SURGERY     Removed 1 lobe   TONSILLECTOMY     Family History  Problem Relation Age of Onset   Breast cancer Mother 69  Lung cancer Sister    Heart attack Brother    Breast cancer Daughter 88   Social History   Socioeconomic History   Marital status: Widowed    Spouse name: Not on file   Number of children: 2   Years of education: Not on file   Highest education level: Doctorate  Occupational History   Occupation: retired  Tobacco Use   Smoking status: Never   Smokeless tobacco: Never  Vaping Use   Vaping status: Never Used  Substance and Sexual Activity   Alcohol use: Not Currently   Drug use: No   Sexual activity: Never    Comment: 1st intercourse 89 yo-1 partner  Other Topics Concern   Not on file  Social History Narrative   Not on file   Social Drivers of Health   Financial Resource Strain: Low Risk  (10/26/2022)   Overall Financial Resource Strain (CARDIA)    Difficulty of Paying Living Expenses: Not hard at all  Food Insecurity: No Food Insecurity (10/26/2022)   Hunger Vital Sign    Worried About Running Out of Food in the Last Year: Never true    Ran Out of Food in the Last Year: Never true  Transportation Needs: No Transportation Needs (10/26/2022)    PRAPARE - Administrator, Civil Service (Medical): No    Lack of Transportation (Non-Medical): No  Physical Activity: Sufficiently Active (10/26/2022)   Exercise Vital Sign    Days of Exercise per Week: 3 days    Minutes of Exercise per Session: 50 min  Stress: No Stress Concern Present (10/26/2022)   [employer] of Occupational Health - Occupational Stress Questionnaire    Feeling of Stress : Only a little  Social Connections: Moderately Integrated (10/06/2023)   Social Connection and Isolation Panel    Frequency of Communication with Friends and Family: More than three times a week    Frequency of Social Gatherings with Friends and Family: More than three times a week    Attends Religious Services: More than 4 times per year    Active Member of [employer] or Organizations: Yes    Attends Banker Meetings: More than 4 times per year    Marital Status: Widowed    Tobacco Counseling Counseling given: Not Answered   Clinical Intake:  Pre-visit preparation completed: Yes  Pain : No/denies pain Pain Score: 0-No pain     BMI - recorded: 21.63 Nutritional Status: BMI of 19-24  Normal Nutritional Risks: None Diabetes: No  How often do you need to have someone help you when you read instructions, pamphlets, or other written materials from your doctor or pharmacy?: 1 - Never What is the last grade level you completed in school?: college  Interpreter Needed?: No  Information entered by :: Darienne Belleau Lorenda Hark NP   Activities of Daily Living    03/02/2024    3:19 PM 03/01/2024   11:30 AM  In your present state of health, do you have any difficulty performing the following activities:  Hearing? 1 1  Comment aids   Vision? 1 0  Comment magnified glass for reading   Difficulty concentrating or making decisions? 0 1  Walking or climbing stairs? 1 1  Comment with handrails   Dressing or bathing? 0 0  Doing errands, shopping? 1 1  Comment son helps    Preparing Food and eating ? N N  Using the Toilet? N N  In the past six months, have you accidently leaked  urine? Y N  Do you have problems with loss of bowel control? N N  Managing your Medications? N N  Managing your Finances? N N  Housekeeping or managing your Housekeeping? N N    Patient Care Team: Charlanne Fredia CROME, MD as PCP - General (Internal Medicine) Inocencio Soyla Lunger, MD as PCP - Cardiology (Cardiology) Inocencio Soyla Lunger, MD as PCP - Electrophysiology (Cardiology)  Indicate any recent Medical Services you may have received from other than [facility] providers in the past year (date may be approximate).     Assessment:   This is a routine wellness examination for [place].  Hearing/Vision screen Hearing Screening - Comments:: Patient with hearing issues, however established with audiologist  Vision Screening - Comments:: Last eye exam less than 12 months with Dr.Jones    Goals Addressed             This Visit's Progress    Maintain Mobility and Function       Evidence-based guidance:  Emphasize the importance of physical activity and aerobic exercise as included in treatment plan; assess barriers to adherence; consider patient's abilities and preferences.  Encourage gradual increase in activity or exercise instead of stopping if pain occurs.  Reinforce individual therapy exercise prescription, such as strengthening, stabilization and stretching programs.  Promote optimal body mechanics to stabilize the spine with lifting and functional activity.  Encourage activity and mobility modifications to facilitate optimal function, such as using a log roll for bed mobility or dressing from a seated position.  Reinforce individual adaptive equipment recommendations to limit excessive spinal movements, such as a Event organiser.  Assess adequacy of sleep; encourage use of sleep hygiene techniques, such as bedtime routine; use of white noise; dark, cool bedroom; avoiding daytime  naps, heavy meals or exercise before bedtime.  Promote positions and modification to optimize sleep and sexual activity; consider pillows or positioning devices to assist in maintaining neutral spine.  Explore options for applying ergonomic principles at work and home, such as frequent position changes, using ergonomically designed equipment and working at optimal height.  Promote modifications to increase comfort with driving such as lumbar support, optimizing seat and steering wheel position, using cruise control and taking frequent rest stops to stretch and walk.   Notes:        Depression Screen    03/02/2024    2:56 PM 06/09/2023   10:30 AM 03/01/2023    3:14 PM 02/03/2023    9:57 AM 10/26/2022    1:22 PM 10/26/2022    1:04 PM 09/16/2022   10:15 AM  PHQ 2/9 Scores  PHQ - 2 Score 0 0 0 0 0 0 0  PHQ- 9 Score   0        Fall Risk    03/01/2024   11:59 AM 02/20/2024    2:58 PM 10/06/2023   10:48 AM 06/09/2023   10:28 AM 03/01/2023    3:15 PM  Fall Risk   Falls in the past year? 0 0 0 0 0  Number falls in past yr: 0 0 0 0 0  Injury with Fall? 0  0 0 0  Risk for fall due to : No Fall Risks  No Fall Risks No Fall Risks No Fall Risks  Follow up Falls evaluation completed  Falls evaluation completed Falls evaluation completed Falls evaluation completed    MEDICARE RISK AT HOME: Medicare Risk at Home Any stairs in or around the home?: Yes If so, are there any without handrails?:  No Home free of loose throw rugs in walkways, pet beds, electrical cords, etc?: Yes Adequate lighting in your home to reduce risk of falls?: Yes Life alert?: Yes Use of a cane, walker or w/c?: No Grab bars in the bathroom?: Yes Shower chair or bench in shower?: Yes Elevated toilet seat or a handicapped toilet?: Yes  TIMED UP AND GO:  Was the test performed?  No    Cognitive Function:    10/26/2022    1:06 PM 09/22/2021    2:25 PM  MMSE - Mini Mental State Exam  Orientation to time 4 5  Orientation to  time comments  year 2023, season spring, date 1st, day monday, month may.  Orientation to Place 5 5  Orientation to Place-comments  state [place], county [place], city [place], facility [facility], provider [facility] [place].  Registration 3 3  Attention/ Calculation 5 5  Attention/Calculation-comments  WORLD.SABRA backwards DLROW  Recall 3 3  Language- name 2 objects 2 2  Language- repeat 1 1  Language- follow 3 step command 3 3  Language- read & follow direction 1 1  Write a sentence 1 1  Copy design 0 1  Total score 28 30        03/02/2024    3:00 PM  6CIT Screen  What Year? 0 points  What month? 0 points  What time? 0 points  Count back from 20 0 points  Months in reverse 0 points  Repeat phrase 0 points  Total Score 0 points    Immunizations Immunization History  Administered Date(s) Administered   Fluad Trivalent(High Dose 65+) 03/23/2023   Fluzone Influenza virus vaccine,trivalent (IIV3), split virus 03/07/2013, 04/03/2014, 03/29/2015, 02/22/2017   INFLUENZA, HIGH DOSE SEASONAL PF 03/04/2016, 03/19/2017, 02/17/2018, 02/27/2021, 03/01/2024   Influenza Split 03/18/2011, 03/03/2012   Influenza,inj,Quad PF,6+ Mos 03/24/2013   Influenza-Unspecified 03/04/2016, 03/26/2020   Moderna Covid-19 Vaccine  Bivalent Booster 43yrs & up 11/05/2021, 04/12/2023   Moderna SARS-COV2 Booster Vaccination 10/30/2020   Moderna Sars-Covid-2 Vaccination 05/31/2019, 06/26/2019   PFIZER Comirnaty(Gray Top)Covid-19 Tri-Sucrose Vaccine 03/30/2022   Pneumococcal Polysaccharide-23 07/02/1999, 11/13/2019   Td 07/10/1998   Td (Adult) 07/10/1998   Tdap 11/30/2008   Unspecified SARS-COV-2 Vaccination 05/29/2019, 06/26/2019   Zoster, Live 11/30/2008    TDAP status: Up to date  Flu Vaccine status: Up to date  Pneumococcal vaccine status: Up to date  Covid-19 vaccine status: Information provided on how to obtain vaccines.   Qualifies for Shingles Vaccine? Yes   Zostavax completed Yes   Shingrix  Completed?: Yes  Screening Tests Health Maintenance  Topic Date Due   COVID-19 Vaccine (8 - 2025-26 season) 01/24/2024   Medicare Annual Wellness (AWV)  03/02/2025   Influenza Vaccine  Completed   DEXA SCAN  Completed   Meningococcal B Vaccine  Aged Out   DTaP/Tdap/Td  Discontinued   Pneumococcal Vaccine: 50+ Years  Discontinued   Zoster Vaccines- Shingrix  Discontinued    Health Maintenance  Health Maintenance Due  Topic Date Due   COVID-19 Vaccine (8 - 2025-26 season) 01/24/2024    Colorectal cancer screening: No longer required.   Mammogram declined f/u, Hx of breast cancer, desires self exam.   Bone Density status: Completed 11/03/2022. Results reflect: Bone density results: OSTEOPOROSIS. Repeat every 2-3 years. If the patient desires.   Lung Cancer Screening: (Low Dose CT Chest recommended if Age 55-80 years, 20 pack-year currently smoking OR have quit w/in 15years.) does not qualify.   Lung Cancer Screening Referral: none  Additional Screening:  Hepatitis C Screening: does not qualify; declined.   Vision Screening: Recommended annual ophthalmology exams for early detection of glaucoma and other disorders of the eye. Is the patient up to date with their annual eye exam?  Yes  Who is the provider or what is the name of the office in which the patient attends annual eye exams? Dr. Joshua If pt is not established with a provider, would they like to be referred to a provider to establish care? No .   Dental Screening: Recommended annual dental exams for proper oral hygiene  Diabetic Foot Exam: NA  Community Resource Referral / Chronic Care Management: CRR required this visit?  No   CCM required this visit?  No     Plan:     I have personally reviewed and noted the following in the patient's chart:   Medical and social history Use of alcohol, tobacco or illicit drugs  Current medications and supplements including opioid prescriptions. Patient is currently  taking opioid prescriptions. Information provided to patient regarding non-opioid alternatives. Patient advised to discuss non-opioid treatment plan with their provider. Functional ability and status Nutritional status Physical activity Advanced directives List of other physicians Hospitalizations, surgeries, and ER visits in previous 12 months Vitals Screenings to include cognitive, depression, and falls Referrals and appointments  In addition, I have reviewed and discussed with patient certain preventive protocols, quality metrics, and best practice recommendations. A written personalized care plan for preventive services as well as general preventive health recommendations were provided to patient.     Shylin Keizer X [facility] Reidinger, NP   03/02/2024   After Visit Summary: (In Person-Printed) AVS printed and given to the patient

## 2024-03-02 NOTE — Patient Instructions (Addendum)
 SABRA

## 2024-03-03 ENCOUNTER — Encounter: Payer: Self-pay | Admitting: Nurse Practitioner

## 2024-03-06 ENCOUNTER — Other Ambulatory Visit (HOSPITAL_COMMUNITY): Payer: Self-pay

## 2024-03-14 ENCOUNTER — Other Ambulatory Visit (HOSPITAL_COMMUNITY): Payer: Self-pay

## 2024-03-14 DIAGNOSIS — L853 Xerosis cutis: Secondary | ICD-10-CM | POA: Diagnosis not present

## 2024-03-14 MED ORDER — AMMONIUM LACTATE 12 % EX CREA
TOPICAL_CREAM | CUTANEOUS | 1 refills | Status: AC
  Filled 2024-03-14: qty 385, 30d supply, fill #0

## 2024-03-15 ENCOUNTER — Other Ambulatory Visit: Payer: Self-pay

## 2024-03-27 ENCOUNTER — Encounter: Payer: Self-pay | Admitting: Radiology

## 2024-03-27 ENCOUNTER — Other Ambulatory Visit (HOSPITAL_COMMUNITY): Payer: Self-pay

## 2024-03-29 ENCOUNTER — Other Ambulatory Visit (HOSPITAL_COMMUNITY): Payer: Self-pay

## 2024-03-29 MED ORDER — LATANOPROST 0.005 % OP SOLN
1.0000 [drp] | Freq: Every day | OPHTHALMIC | 11 refills | Status: AC
  Filled 2024-03-29: qty 5, 50d supply, fill #0

## 2024-04-03 ENCOUNTER — Other Ambulatory Visit: Payer: Self-pay

## 2024-04-10 ENCOUNTER — Other Ambulatory Visit: Payer: Self-pay

## 2024-04-10 ENCOUNTER — Other Ambulatory Visit (HOSPITAL_COMMUNITY): Payer: Self-pay

## 2024-04-10 ENCOUNTER — Other Ambulatory Visit: Payer: Self-pay | Admitting: Internal Medicine

## 2024-04-10 MED ORDER — OXYCODONE HCL 5 MG PO TABS
5.0000 mg | ORAL_TABLET | Freq: Two times a day (BID) | ORAL | 0 refills | Status: DC
  Filled 2024-04-10: qty 60, 30d supply, fill #0

## 2024-04-10 NOTE — Telephone Encounter (Signed)
 Patient is requesting a refill of the following medications: Requested Prescriptions   Pending Prescriptions Disp Refills   oxyCODONE  (OXY IR/ROXICODONE ) 5 MG immediate release tablet 60 tablet 0    Sig: Take 1 tablet (5 mg total) by mouth 2 (two) times daily.    Date of last refill:02/15/24  Refill amount: 60/0  Treatment agreement date: 02/08/24

## 2024-04-24 ENCOUNTER — Other Ambulatory Visit (HOSPITAL_COMMUNITY): Payer: Self-pay

## 2024-05-01 ENCOUNTER — Other Ambulatory Visit (HOSPITAL_COMMUNITY): Payer: Self-pay

## 2024-05-02 NOTE — Progress Notes (Unsigned)
  Electrophysiology Office Note:   Date:  05/02/2024  ID:  Laura Lee, DOB Feb 25, 1932, MRN [id number]  Primary Cardiologist: Korine Winton Gladis Norton, MD Primary Heart Failure: None Electrophysiologist: Myrle Wanek Gladis Norton, MD  {Click to update primary MD,subspecialty MD or APP then REFRESH:1}    History of Present Illness:   Laura Lee is a 89 y.o. female with h/o atrial fibrillation, chronic diastolic heart failure, hypertension seen today for routine electrophysiology followup.   Since last being seen in our clinic the patient reports doing ***.  she denies chest pain, palpitations, dyspnea, PND, orthopnea, nausea, vomiting, dizziness, syncope, edema, weight gain, or early satiety.   Review of systems complete and found to be negative unless listed in HPI.   EP Information / Studies Reviewed:    {EKGtoday:28818}       Risk Assessment/Calculations:    CHA2DS2-VASc Score = 5  {Confirm score is correct.  If not, click here to update score.  REFRESH note.  :1} This indicates a 7.2% annual risk of stroke. The patient's score is based upon: CHF History: 1 HTN History: 1 Diabetes History: 0 Stroke History: 0 Vascular Disease History: 0 Age Score: 2 Gender Score: 1   {This patient has a significant risk of stroke if diagnosed with atrial fibrillation.  Please consider VKA or DOAC agent for anticoagulation if the bleeding risk is acceptable.   You can also use the SmartPhrase .[email or web address] for documentation.   :[id number]}         Physical Exam:   VS:  There were no vitals taken for this visit.   Wt Readings from Last 3 Encounters:  03/02/24 126 lb (57.2 kg)  02/09/24 127 lb 1.6 oz (57.7 kg)  02/02/24 127 lb 12.8 oz (58 kg)     GEN: Well nourished, well developed in no acute distress NECK: No JVD; No carotid bruits CARDIAC: {EPRHYTHM:28826}, no murmurs, rubs, gallops RESPIRATORY:  Clear to auscultation without rales, wheezing or rhonchi  ABDOMEN: Soft, non-tender,  non-distended EXTREMITIES:  No edema; No deformity   ASSESSMENT AND PLAN:    1.  Persistent atrial fibrillation: On amiodarone  and diltiazem .***  2.  Secondary hypercoagulable state: On Eliquis   3.  Hypertension:***  4.  High risk medication monitoring: On amiodarone .  Recent LFTs and TSH within normal limits  Follow up with {EPMDS:28135::EP Team} {EPFOLLOW LE:71826}  Signed, Kamilla Hands Gladis Norton, MD

## 2024-05-03 ENCOUNTER — Ambulatory Visit: Attending: Cardiology | Admitting: Cardiology

## 2024-05-03 ENCOUNTER — Encounter: Payer: Self-pay | Admitting: Cardiology

## 2024-05-03 VITALS — BP 104/58 | HR 59 | Ht 64.0 in | Wt 129.0 lb

## 2024-05-03 DIAGNOSIS — D6869 Other thrombophilia: Secondary | ICD-10-CM | POA: Insufficient documentation

## 2024-05-03 DIAGNOSIS — I10 Essential (primary) hypertension: Secondary | ICD-10-CM | POA: Diagnosis not present

## 2024-05-03 DIAGNOSIS — I4819 Other persistent atrial fibrillation: Secondary | ICD-10-CM | POA: Diagnosis present

## 2024-05-03 DIAGNOSIS — Z79899 Other long term (current) drug therapy: Secondary | ICD-10-CM | POA: Diagnosis not present

## 2024-05-03 NOTE — Patient Instructions (Signed)
 Medication Instructions:  Your physician has recommended you make the following change in your medication:  STOP Diltiazem   *If you need a refill on your cardiac medications before your next appointment, please call your pharmacy*  Lab Work: None ordered   Testing/Procedures: None ordered  Follow-Up: At [facility], you and your health needs are our priority.  As part of our continuing mission to provide you with exceptional heart care, our providers are all part of one team.  This team includes your primary Cardiologist (physician) and Advanced Practice Providers or APPs (Physician Assistants and Nurse Practitioners) who all work together to provide you with the care you need, when you need it.  Your next appointment:   6 month(s)  Provider:   You will see one of the following Advanced Practice Providers on your designated Care Team:   Charlies Arthur, PA-C Michael Andy Tillery, PA-C Suzann Riddle, NP Daphne Barrack, NP Artist Pouch, PA-C   Thank you for choosing [facility]!!   Maeola Domino, RN [phone]

## 2024-05-08 ENCOUNTER — Other Ambulatory Visit: Payer: Self-pay | Admitting: Cardiology

## 2024-05-08 ENCOUNTER — Other Ambulatory Visit (HOSPITAL_COMMUNITY): Payer: Self-pay

## 2024-05-08 DIAGNOSIS — I5022 Chronic systolic (congestive) heart failure: Secondary | ICD-10-CM

## 2024-05-09 ENCOUNTER — Other Ambulatory Visit (HOSPITAL_COMMUNITY): Payer: Self-pay

## 2024-05-09 MED ORDER — SACUBITRIL-VALSARTAN 24-26 MG PO TABS
1.0000 | ORAL_TABLET | Freq: Two times a day (BID) | ORAL | 3 refills | Status: DC
  Filled 2024-05-09: qty 180, 90d supply, fill #0

## 2024-05-14 ENCOUNTER — Other Ambulatory Visit: Payer: Self-pay

## 2024-05-14 ENCOUNTER — Encounter (HOSPITAL_COMMUNITY): Payer: Self-pay | Admitting: Internal Medicine

## 2024-05-14 ENCOUNTER — Observation Stay (HOSPITAL_COMMUNITY)
Admission: EM | Admit: 2024-05-14 | Discharge: 2024-05-15 | Disposition: A | Discharge status: Home / Self Care | Attending: Family Medicine | Admitting: Family Medicine

## 2024-05-14 DIAGNOSIS — R262 Difficulty in walking, not elsewhere classified: Secondary | ICD-10-CM | POA: Diagnosis not present

## 2024-05-14 DIAGNOSIS — D72829 Elevated white blood cell count, unspecified: Secondary | ICD-10-CM | POA: Insufficient documentation

## 2024-05-14 DIAGNOSIS — D649 Anemia, unspecified: Secondary | ICD-10-CM | POA: Insufficient documentation

## 2024-05-14 DIAGNOSIS — I110 Hypertensive heart disease with heart failure: Secondary | ICD-10-CM | POA: Insufficient documentation

## 2024-05-14 DIAGNOSIS — I83899 Varicose veins of unspecified lower extremities with other complications: Principal | ICD-10-CM | POA: Insufficient documentation

## 2024-05-14 DIAGNOSIS — I5032 Chronic diastolic (congestive) heart failure: Secondary | ICD-10-CM | POA: Insufficient documentation

## 2024-05-14 DIAGNOSIS — Z79899 Other long term (current) drug therapy: Secondary | ICD-10-CM | POA: Insufficient documentation

## 2024-05-14 DIAGNOSIS — Z7901 Long term (current) use of anticoagulants: Secondary | ICD-10-CM

## 2024-05-14 DIAGNOSIS — R739 Hyperglycemia, unspecified: Secondary | ICD-10-CM

## 2024-05-14 DIAGNOSIS — D62 Acute posthemorrhagic anemia: Secondary | ICD-10-CM | POA: Insufficient documentation

## 2024-05-14 DIAGNOSIS — E039 Hypothyroidism, unspecified: Secondary | ICD-10-CM | POA: Diagnosis not present

## 2024-05-14 DIAGNOSIS — I480 Paroxysmal atrial fibrillation: Secondary | ICD-10-CM | POA: Insufficient documentation

## 2024-05-14 LAB — ABO/RH: ABO/RH(D): O POS

## 2024-05-14 LAB — CBC WITH DIFFERENTIAL/PLATELET
Abs Immature Granulocytes: 0.09 K/uL — ABNORMAL HIGH (ref 0.00–0.07)
Basophils Absolute: 0.1 K/uL (ref 0.0–0.1)
Basophils Relative: 1 %
Eosinophils Absolute: 0.2 K/uL (ref 0.0–0.5)
Eosinophils Relative: 1 %
HCT: 29.8 % — ABNORMAL LOW (ref 36.0–46.0)
Hemoglobin: 9.5 g/dL — ABNORMAL LOW (ref 12.0–15.0)
Immature Granulocytes: 1 %
Lymphocytes Relative: 10 %
Lymphs Abs: 1.3 K/uL (ref 0.7–4.0)
MCH: 28.6 pg (ref 26.0–34.0)
MCHC: 31.9 g/dL (ref 30.0–36.0)
MCV: 89.8 fL (ref 80.0–100.0)
Monocytes Absolute: 1.2 K/uL — ABNORMAL HIGH (ref 0.1–1.0)
Monocytes Relative: 9 %
Neutro Abs: 10 K/uL — ABNORMAL HIGH (ref 1.7–7.7)
Neutrophils Relative %: 78 %
Platelets: ADEQUATE K/uL (ref 150–400)
RBC: 3.32 MIL/uL — ABNORMAL LOW (ref 3.87–5.11)
RDW: 15.5 % (ref 11.5–15.5)
WBC: 12.8 K/uL — ABNORMAL HIGH (ref 4.0–10.5)
nRBC: 0 % (ref 0.0–0.2)

## 2024-05-14 LAB — BASIC METABOLIC PANEL WITH GFR
Anion gap: 7 (ref 5–15)
BUN: 29 mg/dL — ABNORMAL HIGH (ref 8–23)
CO2: 28 mmol/L (ref 22–32)
Calcium: 8 mg/dL — ABNORMAL LOW (ref 8.9–10.3)
Chloride: 105 mmol/L (ref 98–111)
Creatinine, Ser: 1.17 mg/dL — ABNORMAL HIGH (ref 0.44–1.00)
GFR, Estimated: 44 mL/min — ABNORMAL LOW
Glucose, Bld: 131 mg/dL — ABNORMAL HIGH (ref 70–99)
Potassium: 3.8 mmol/L (ref 3.5–5.1)
Sodium: 141 mmol/L (ref 135–145)

## 2024-05-14 LAB — PROTIME-INR
INR: 1.4 — ABNORMAL HIGH (ref 0.8–1.2)
Prothrombin Time: 17.8 s — ABNORMAL HIGH (ref 11.4–15.2)

## 2024-05-14 LAB — TYPE AND SCREEN
ABO/RH(D): O POS
Antibody Screen: NEGATIVE

## 2024-05-14 LAB — HEMOGLOBIN AND HEMATOCRIT, BLOOD
HCT: 26.9 % — ABNORMAL LOW (ref 36.0–46.0)
Hemoglobin: 8.5 g/dL — ABNORMAL LOW (ref 12.0–15.0)

## 2024-05-14 MED ORDER — LACTATED RINGERS IV BOLUS
1000.0000 mL | Freq: Once | INTRAVENOUS | Status: AC
  Administered 2024-05-14: 1000 mL via INTRAVENOUS

## 2024-05-14 MED ORDER — AMIODARONE HCL 200 MG PO TABS
200.0000 mg | ORAL_TABLET | Freq: Every day | ORAL | Status: DC
  Administered 2024-05-14 – 2024-05-15 (×2): 200 mg via ORAL
  Filled 2024-05-14 (×2): qty 1

## 2024-05-14 MED ORDER — LEVOTHYROXINE SODIUM 100 MCG PO TABS
100.0000 ug | ORAL_TABLET | Freq: Every day | ORAL | Status: DC
  Administered 2024-05-14 – 2024-05-15 (×2): 100 ug via ORAL
  Filled 2024-05-14 (×2): qty 1

## 2024-05-14 MED ORDER — ARTIFICIAL TEARS OPHTHALMIC OINT: 1.0000 | TOPICAL_OINTMENT | Freq: Two times a day (BID) | OPHTHALMIC | Status: DC | PRN

## 2024-05-14 MED ORDER — POLYVINYL ALCOHOL 1.4 % OP SOLN: 1.0000 [drp] | Freq: Three times a day (TID) | OPHTHALMIC | Status: DC | PRN

## 2024-05-14 MED ORDER — OXYCODONE HCL 5 MG PO TABS
5.0000 mg | ORAL_TABLET | Freq: Once | ORAL | Status: AC
  Administered 2024-05-14: 5 mg via ORAL
  Filled 2024-05-14: qty 1

## 2024-05-14 MED ORDER — LIDOCAINE-EPINEPHRINE (PF) 2 %-1:200000 IJ SOLN
20.0000 mL | Freq: Once | INTRAMUSCULAR | Status: AC
  Administered 2024-05-14: 20 mL
  Filled 2024-05-14: qty 20

## 2024-05-14 MED ORDER — LATANOPROST 0.005 % OP SOLN
1.0000 [drp] | Freq: Every day | OPHTHALMIC | Status: DC
  Administered 2024-05-14: 1 [drp] via OPHTHALMIC
  Filled 2024-05-14: qty 2.5

## 2024-05-14 NOTE — ED Triage Notes (Signed)
 Pt BIB from [facility] for varicose vein bleeding on left foot from stubbing her toe. Pt is on eliquis , denies falling or hitting head. Per EMS pt was hypotenisve and brady down. Pt denies pain at this time.

## 2024-05-14 NOTE — Consult Note (Signed)
 " Hospital Consult    Reason for Consult:  varicose vein bleed  MRN #:  [id number]  History of Present Illness: This is a 89 y.o. female with A-fib on Eliquis  who presented to the emergency department due to significant bleeding from a varicose vein on her left foot after stubbing her toe.  In the field she was apparently hypotensive and bradycardic.  I saw her this morning she was hemodynamically stable alert and oriented and answering questions appropriately.  She denies any previous bleed.  She reports that she does have compression stockings although ever since starting Lasix  they do not fit as well anymore.  She says that her legs used to be much more swollen but and they are more manageable.  She does not have significant varicosities and only has a few on her left foot.  Past Medical History:  Diagnosis Date   Atrial fibrillation (HCC)    Breast CA (HCC)    Cancer (HCC)    Breast and thyroid  cancer   Glaucoma    Hearing impaired    Hearing aids   Hypertension    Osteopenia 05/2013   T score -1.9 FRAX 15%/4.3%   Thyroid  disease    Cancer    Past Surgical History:  Procedure Laterality Date   APPENDECTOMY     BREAST EXCISIONAL BIOPSY Right    benign   BREAST LUMPECTOMY     [id number] radiation   BREAST SURGERY  1989   Lumpectomy right breast   CARDIOVERSION N/A 02/14/2016   Procedure: CARDIOVERSION;  Surgeon: Wilbert JONELLE Bihari, MD;  Location: [facility] ENDOSCOPY;  Service: Cardiovascular;  Laterality: N/A;   CARDIOVERSION N/A 08/04/2018   Procedure: CARDIOVERSION;  Surgeon: Lonni Slain, MD;  Location: [facility] ENDOSCOPY;  Service: Cardiovascular;  Laterality: N/A;   CARDIOVERSION N/A 10/31/2019   Procedure: CARDIOVERSION;  Surgeon: Maranda Leim DEL, MD;  Location: [facility] ENDOSCOPY;  Service: Cardiovascular;  Laterality: N/A;   CARDIOVERSION N/A 05/02/2020   Procedure: CARDIOVERSION;  Surgeon: Loni Soyla LABOR, MD;  Location: [facility] ENDOSCOPY;  Service: Cardiovascular;  Laterality: N/A;    CARDIOVERSION N/A 09/10/2020   Procedure: CARDIOVERSION;  Surgeon: Santo Stanly LABOR, MD;  Location: [facility] ENDOSCOPY;  Service: Cardiovascular;  Laterality: N/A;   Mole excised     Benign   THYROID  SURGERY     Removed 1 lobe   TONSILLECTOMY      Allergies[1]  Prior to Admission medications  Medication Sig Start Date End Date Taking? Authorizing Provider  amiodarone  (PACERONE ) 200 MG tablet Take 1 tablet (200 mg total) by mouth daily. 12/14/23  Yes Camnitz, Will Gladis, MD  ammonium lactate  (AMLACTIN) 12 % cream Apply a thin layer to affected areas up to twice daily. Avoid face, skin folds, and areas of broken skin. Discontinue if irritation occurs. 03/14/24  Yes   apixaban  (ELIQUIS ) 2.5 MG TABS tablet Take 1 tablet (2.5 mg total) by mouth 2 (two) times daily. 09/06/23  Yes Duke, Jon Garre, PA  beta carotene w/minerals (OCUVITE) tablet Take 1 tablet by mouth at bedtime.   Yes [provider]  CALCIUM  PO Take 1 tablet by mouth daily.   Yes [provider]  carboxymethylcellulose (REFRESH PLUS) 0.5 % SOLN Place 1 drop into both eyes 3 (three) times daily as needed (dry/irritated eyes.).    Yes [provider]  cholecalciferol  (VITAMIN D ) 25 MCG (1000 UNIT) tablet Take 1,000 Units by mouth in the morning. chewables   Yes [provider]  furosemide  (LASIX ) 20 MG tablet Take  3 tablets (60 mg total) by mouth daily. For the next 3 days take Lasix  40 mg twice a day, then continue with 60 mg once a day in the morning. 01/11/24  Yes West, Katlyn D, NP  glucosamine-chondroitin 500-400 MG tablet Take 1 tablet by mouth in the morning and at bedtime.   Yes [provider]  latanoprost  (XALATAN ) 0.005 % ophthalmic solution Place 1 drop into both eyes at bedtime. 03/29/24  Yes   levothyroxine  (SYNTHROID ) 100 MCG tablet Take 1 tablet (100 mcg total) by mouth daily before breakfast. 12/03/23  Yes Charlanne Fredia CROME, MD  Multiple Vitamin (MULTIVITAMIN WITH MINERALS)  TABS tablet Take 1 tablet by mouth daily with lunch. Adults 50+   Yes [provider]  oxyCODONE  (OXY IR/ROXICODONE ) 5 MG immediate release tablet Take 1 tablet (5 mg total) by mouth 2 (two) times daily. Patient taking differently: Take 5 mg by mouth at bedtime. 04/10/24  Yes Wert, Tawni, NP  potassium chloride  (KLOR-CON  M) 10 MEQ tablet Take 1 tablet (10 mEq total) by mouth daily. 02/09/24  Yes Charlanne Fredia CROME, MD  sacubitril -valsartan  (ENTRESTO ) 24-26 MG Take 1 tablet by mouth 2 (two) times daily. 05/09/24  Yes Camnitz, Will Gladis, MD  COVID-19 mRNA vaccine [facility]) syringe Inject 0.5 mLs into the muscle as directed. Patient not taking: Reported on 05/14/2024 02/09/24   Gupta, Anjali L, MD  Emollient (AQUAPHOR ADVANCED THERAPY) OINT Apply 1 Application topically 2 (two) times daily as needed.    [provider]    Social History   Socioeconomic History   Marital status: Widowed    Spouse name: Not on file   Number of children: 2   Years of education: Not on file   Highest education level: Doctorate  Occupational History   Occupation: retired  Tobacco Use   Smoking status: Never   Smokeless tobacco: Never  Vaping Use   Vaping status: Never Used  Substance and Sexual Activity   Alcohol  use: Not Currently   Drug use: No   Sexual activity: Never    Comment: 1st intercourse 57 yo-1 partner  Other Topics Concern   Not on file  Social History Narrative   Not on file   Social Drivers of Health   Tobacco Use: Low Risk (05/14/2024)   Patient History    Smoking Tobacco Use: Never    Smokeless Tobacco Use: Never    Passive Exposure: Not on file  Financial Resource Strain: Low Risk (10/26/2022)   Overall Financial Resource Strain (CARDIA)    Difficulty of Paying Living Expenses: Not hard at all  Food Insecurity: No Food Insecurity (10/26/2022)   Hunger Vital Sign    Worried About Running Out of Food in the Last Year: Never true    Ran Out of Food in the Last  Year: Never true  Transportation Needs: No Transportation Needs (10/26/2022)   PRAPARE - Administrator, Civil Service (Medical): No    Lack of Transportation (Non-Medical): No  Physical Activity: Sufficiently Active (10/26/2022)   Exercise Vital Sign    Days of Exercise per Week: 3 days    Minutes of Exercise per Session: 50 min  Stress: No Stress Concern Present (10/26/2022)   [employer] of Occupational Health - Occupational Stress Questionnaire    Feeling of Stress : Only a little  Social Connections: Moderately Integrated (10/06/2023)   Social Connection and Isolation Panel    Frequency of Communication with Friends and Family: More than three times a week  Frequency of Social Gatherings with Friends and Family: More than three times a week    Attends Religious Services: More than 4 times per year    Active Member of Clubs or Organizations: Yes    Attends Banker Meetings: More than 4 times per year    Marital Status: Widowed  Intimate Partner Violence: Not At Risk (10/26/2022)   Humiliation, Afraid, Rape, and Kick questionnaire    Fear of Current or Ex-Partner: No    Emotionally Abused: No    Physically Abused: No    Sexually Abused: No  Depression (PHQ2-9): Low Risk (03/02/2024)   Depression (PHQ2-9)    PHQ-2 Score: 0  Alcohol  Screen: Low Risk (10/26/2022)   Alcohol  Screen    Last Alcohol  Screening Score (AUDIT): 0  Housing: Low Risk (10/26/2022)   Housing    Last Housing Risk Score: 0  Utilities: Not At Risk (10/26/2022)   AHC Utilities    Threatened with loss of utilities: No  Health Literacy: Not on file    Family History  Problem Relation Age of Onset   Breast cancer Mother 52   Lung cancer Sister    Heart attack Brother    Breast cancer Daughter 76    ROS: Otherwise negative unless mentioned in HPI  Physical Examination  Vitals:   05/14/24 1045 05/14/24 1100  BP: 106/63 (!) 109/55  Pulse: (!) 57 (!) 52  Resp: 14 13  Temp:    SpO2:  98% 99%   Body mass index is 22.14 kg/m.  General: no acute distress Cardiac: hemodynamically stable Pulm: normal work of breathing Neuro: alert, no focal deficit Extremities: 1+ edema from ankles to knees bilaterally, stitch in place on dorsal aspect of left foot where a varicosity bleed was.  No other significant engorged varicosities throughout legs.  Tenderness to palpation throughout leg. Vascular:   Right: Palpable DP  Left: Palpable DP    ASSESSMENT/PLAN: This is a 89 y.o. female who was admitted overnight with a significant left foot  varicose vein bleed after stubbing her toe causing a large drop in hemoglobin and hypotension.  A stitch was placed by the ED physician and has been hemostatic throughout the night.  I explained that there is no urgent surgical intervention that is needed for these bleeds after hemostasis is achieved.  I also explained to the patient and her son that if her compression stockings no longer stretch she needs to be remeasured.  Will order compression stockings to be given here in the hospital. Given that this bleed was caused by trauma and her varicosities and swelling are minimal I do not believe that a vein ablation in the future would be necessary 89 years old. If she does have a rebleed event in the future then this can be reconsidered. Follow-up as needed   Norman GORMAN Serve MD [facility] [phone] 05/14/2024  12:25 PM     [1]  Allergies Allergen Reactions   Combigan [Brimonidine Tartrate-Timolol] Itching and Other (See Comments)    Red eye    Pneumococcal Vaccines Other (See Comments)    Pain in arms/legs--nerve issues   "

## 2024-05-14 NOTE — ED Notes (Signed)
 Pt unable to test ambulation due to dizziness upon standing. Pt very weak and requested to sit down.

## 2024-05-14 NOTE — ED Notes (Addendum)
 Patient ambulatory to restroom with assistance without changes in wellbeing or verbalized discomfort.  Without noted shortness or breath with exertion, no complaints of discomfort.

## 2024-05-14 NOTE — ED Notes (Signed)
 Admitting MD at bedside.

## 2024-05-14 NOTE — Care Management CC44 (Signed)
"         Condition Code 44 Documentation Completed  Patient Details  Name: KURSTYN LARIOS MRN: [id number] Date of Birth: 08-Aug-1931   Condition Code 44 given:  Yes Patient signature on Condition Code 44 notice:  Yes Documentation of 2 MD's agreement:  Yes Code 44 added to claim:  Yes    Corean JAYSON Canary, RN 05/14/2024, 3:34 PM  "

## 2024-05-14 NOTE — Progress Notes (Signed)
" ° °  Brief Progress Note   _____________________________________________________________________________________________________________  Patient Name: Laura Lee Patient DOB: 06/22/1931 Date: @TODAY @      Data: Reviewed vital signs, labs, and clinical notes.    Action: No action required at this time.    Response:  Vascular surgery consulted. Awaiting bed assignment.  _____________________________________________________________________________________________________________  [facility] RN Expeditor Laura Lee Please contact us  directly via secure chat (search for [facility]) or by calling us  at [phone] [facility]).  "

## 2024-05-14 NOTE — ED Notes (Signed)
 EDP at bedside

## 2024-05-14 NOTE — H&P (Signed)
 " History and Physical    Laura Lee FMW:[id number] DOB: 1931/10/15 DOA: 05/14/2024  PCP: Charlanne Fredia CROME, MD  Patient coming from: home  I have personally briefly reviewed patient's old medical records in [facility]  Chief Complaint: bleeding varicose vein  HPI: Laura Lee is a 89 y.o. female with medical history significant of Atrial fibrillation on Eliquis , chronic diastolic heart failure, Glaucoma,  hx of BRCA , hypertension, hypothyroidism, who presents from friends home BIBEMS with bleeding varicose vein on left foot after stubbing her toe.  In the field patient was noted to be hypotensive ad bradycardic. Patient currently , no complaints no n/v/d/ chest pain / sob /fever/ or chills.  Patient has been able to walk to bathroom without being presyncopal per RN.   ED Course:  Vitals: afeb, bp 120.76, hr 57, rr 24, sat 100% Na 151, K 3.8, cl 105, glu 131, cr 1.17 close to baseline  gfr 44 Wbc 12.8, hgb 9.5 ( 12.7)  In ED per EDP hemostasis achieved   Tx LR  Review of Systems: As per HPI otherwise 10 point review of systems negative.   Past Medical History:  Diagnosis Date   Atrial fibrillation (HCC)    Breast CA (HCC)    Cancer (HCC)    Breast and thyroid  cancer   Glaucoma    Hearing impaired    Hearing aids   Hypertension    Osteopenia 05/2013   T score -1.9 FRAX 15%/4.3%   Thyroid  disease    Cancer    Past Surgical History:  Procedure Laterality Date   APPENDECTOMY     BREAST EXCISIONAL BIOPSY Right    benign   BREAST LUMPECTOMY     [id number] radiation   BREAST SURGERY  1989   Lumpectomy right breast   CARDIOVERSION N/A 02/14/2016   Procedure: CARDIOVERSION;  Surgeon: Wilbert JONELLE Bihari, MD;  Location: [facility] ENDOSCOPY;  Service: Cardiovascular;  Laterality: N/A;   CARDIOVERSION N/A 08/04/2018   Procedure: CARDIOVERSION;  Surgeon: Lonni Slain, MD;  Location: [facility] ENDOSCOPY;  Service: Cardiovascular;  Laterality: N/A;   CARDIOVERSION N/A 10/31/2019    Procedure: CARDIOVERSION;  Surgeon: Maranda Leim DEL, MD;  Location: [facility] ENDOSCOPY;  Service: Cardiovascular;  Laterality: N/A;   CARDIOVERSION N/A 05/02/2020   Procedure: CARDIOVERSION;  Surgeon: Loni Soyla LABOR, MD;  Location: [facility] ENDOSCOPY;  Service: Cardiovascular;  Laterality: N/A;   CARDIOVERSION N/A 09/10/2020   Procedure: CARDIOVERSION;  Surgeon: Santo Stanly LABOR, MD;  Location: [facility] ENDOSCOPY;  Service: Cardiovascular;  Laterality: N/A;   Mole excised     Benign   THYROID  SURGERY     Removed 1 lobe   TONSILLECTOMY       reports that she has never smoked. She has never used smokeless tobacco. She reports that she does not currently use alcohol . She reports that she does not use drugs.  Allergies[1]  Family History  Problem Relation Age of Onset   Breast cancer Mother 64   Lung cancer Sister    Heart attack Brother    Breast cancer Daughter 67    Prior to Admission medications  Medication Sig Start Date End Date Taking? Authorizing Provider  amiodarone  (PACERONE ) 200 MG tablet Take 1 tablet (200 mg total) by mouth daily. 12/14/23   Camnitz, Soyla Lunger, MD  ammonium lactate  (AMLACTIN) 12 % cream Apply a thin layer to affected areas up to twice daily. Avoid face, skin folds, and areas of broken skin. Discontinue if irritation occurs. 03/14/24  apixaban  (ELIQUIS ) 2.5 MG TABS tablet Take 1 tablet (2.5 mg total) by mouth 2 (two) times daily. 09/06/23   Duke, Jon Garre, PA  beta carotene w/minerals (OCUVITE) tablet Take 1 tablet by mouth at bedtime.    [provider]  Calcium  Citrate-Vitamin D  (CALCIUM  CITRATE + D3 PO) Take 1 tablet by mouth 2 (two) times daily.    [provider]  carboxymethylcellulose (REFRESH PLUS) 0.5 % SOLN Place 1 drop into both eyes 3 (three) times daily as needed (dry/irritated eyes.).     [provider]  cholecalciferol  (VITAMIN D ) 25 MCG (1000 UNIT) tablet Take 1,000 Units by mouth in the morning. chewables     [provider]  COVID-19 mRNA vaccine [facility]) syringe Inject 0.5 mLs into the muscle as directed. 02/09/24   Charlanne Fredia CROME, MD  diclofenac  Sodium (VOLTAREN ) 1 % GEL Apply 2 g topically 4 (four) times daily. 05/25/23   Duke, Jon Garre, PA  Emollient (AQUAPHOR ADVANCED THERAPY) OINT Apply 1 Application topically 2 (two) times daily as needed.    [provider]  furosemide  (LASIX ) 20 MG tablet Take 3 tablets (60 mg total) by mouth daily. For the next 3 days take Lasix  40 mg twice a day, then continue with 60 mg once a day in the morning. 01/11/24   West, Katlyn D, NP  glucosamine-chondroitin 500-400 MG tablet Take 1 tablet by mouth in the morning and at bedtime.    [provider]  latanoprost  (XALATAN ) 0.005 % ophthalmic solution Place 1 drop into both eyes at bedtime.  05/22/18   [provider]  latanoprost  (XALATAN ) 0.005 % ophthalmic solution Place 1 drop into both eyes at bedtime. 03/29/24     levothyroxine  (SYNTHROID ) 100 MCG tablet Take 1 tablet (100 mcg total) by mouth daily before breakfast. 12/03/23   Charlanne Fredia CROME, MD  Multiple Vitamin (MULTIVITAMIN WITH MINERALS) TABS tablet Take 1 tablet by mouth daily with lunch. Adults 50+    [provider]  oxyCODONE  (OXY IR/ROXICODONE ) 5 MG immediate release tablet Take 1 tablet (5 mg total) by mouth 2 (two) times daily. 04/10/24   Wert, Christina, NP  Polyethylene Glycol 400 (BLINK TEARS) 0.25 % SOLN Place 1-2 drops into both eyes 3 (three) times daily as needed (dry/irritated eyes).    [provider]  potassium chloride  (KLOR-CON  M) 10 MEQ tablet Take 1 tablet (10 mEq total) by mouth daily. 02/09/24   Charlanne Fredia CROME, MD  sacubitril -valsartan  (ENTRESTO ) 24-26 MG Take 1 tablet by mouth 2 (two) times daily. 05/09/24   Camnitz, Soyla Lunger, MD  triamcinolone  cream (KENALOG ) 0.1 % Apply to the affected areas twice daily for up to 2 weeks at a time. Avoid application to the face, groin,  armpits, and skin folds 03/09/23       Physical Exam: Vitals:   05/14/24 0245 05/14/24 0315 05/14/24 0330 05/14/24 0345  BP: (!) 100/55 (!) 102/52 (!) 106/54 (!) 105/57  Pulse: (!) 58 (!) 55 (!) 58 (!) 58  Resp: 18 16 12 17   Temp:      TempSrc:      SpO2: 99% 98% 100% 99%  Weight:      Height:        Constitutional: NAD, calm, comfortable Vitals:   05/14/24 0245 05/14/24 0315 05/14/24 0330 05/14/24 0345  BP: (!) 100/55 (!) 102/52 (!) 106/54 (!) 105/57  Pulse: (!) 58 (!) 55 (!) 58 (!) 58  Resp: 18 16 12 17   Temp:  TempSrc:      SpO2: 99% 98% 100% 99%  Weight:      Height:       Eyes: PERRL, lids and conjunctivae normal ENMT: Mucous membranes are moist. Posterior pharynx clear of any exudate or lesions.Normal dentition.  Neck: normal, supple, no masses, no thyromegaly Respiratory: clear to auscultation bilaterally, no wheezing, no crackles. Normal respiratory effort. No accessory muscle use.  Cardiovascular: Regular rate and rhythm, no murmurs / rubs / gallops. No extremity edema. 2+ pedal pulses.  Abdomen: no tenderness, no masses palpated. No hepatosplenomegaly. Bowel sounds positive.  Musculoskeletal: no clubbing / cyanosis. No joint deformity upper and lower extremities. Good ROM, no contractures. Normal muscle tone.  Skin: no rashes, lesions, ulcers. No induration,  dressing over left foot c/d/I no bleeding noted, dried blood on toes Neurologic: CN 2-12 grossly intact. Sensation intact, Strength 5/5 in all 4.  Psychiatric: Normal judgment and insight. Alert and oriented x 3. Normal mood.    Labs on Admission: I have personally reviewed following labs and imaging studies  CBC: Recent Labs  Lab 05/14/24 0156  WBC 12.8*  NEUTROABS 10.0*  HGB 9.5*  HCT 29.8*  MCV 89.8  PLT PLATELET CLUMPS NOTED ON SMEAR, COUNT APPEARS ADEQUATE   Basic Metabolic Panel: Recent Labs  Lab 05/14/24 0156  NA 141  K 3.8  CL 105  CO2 28  GLUCOSE 131*  BUN 29*  CREATININE  1.17*  CALCIUM  8.0*   GFR: Estimated Creatinine Clearance: 28.1 mL/min (A) (by C-G formula based on SCr of 1.17 mg/dL (H)). Liver Function Tests: No results for input(s): AST, ALT, ALKPHOS, BILITOT, PROT, ALBUMIN in the last 168 hours. No results for input(s): LIPASE, AMYLASE in the last 168 hours. No results for input(s): AMMONIA in the last 168 hours. Coagulation Profile: No results for input(s): INR, PROTIME in the last 168 hours. Cardiac Enzymes: No results for input(s): CKTOTAL, CKMB, CKMBINDEX, TROPONINI in the last 168 hours. BNP (last 3 results) No results for input(s): PROBNP in the last 8760 hours. HbA1C: No results for input(s): HGBA1C in the last 72 hours. CBG: No results for input(s): GLUCAP in the last 168 hours. Lipid Profile: No results for input(s): CHOL, HDL, LDLCALC, TRIG, CHOLHDL, LDLDIRECT in the last 72 hours. Thyroid  Function Tests: No results for input(s): TSH, T4TOTAL, FREET4, T3FREE, THYROIDAB in the last 72 hours. Anemia Panel: No results for input(s): VITAMINB12, FOLATE, FERRITIN, TIBC, IRON, RETICCTPCT in the last 72 hours. Urine analysis:    Component Value Date/Time   COLORURINE YELLOW 03/27/2014 0919   APPEARANCEUR CLEAR 03/27/2014 0919   LABSPEC 1.008 03/27/2014 0919   PHURINE 8.0 03/27/2014 0919   GLUCOSEU NEG 03/27/2014 0919   HGBUR NEG 03/27/2014 0919   BILIRUBINUR NEG 03/27/2014 0919   KETONESUR NEG 03/27/2014 0919   PROTEINUR NEG 03/27/2014 0919   UROBILINOGEN 0.2 03/27/2014 0919   NITRITE NEG 03/27/2014 0919   LEUKOCYTESUR SMALL (A) 03/27/2014 0919    Radiological Exams on Admission: No results found.  EKG: Independently reviewed.   Assessment/Plan  Acute Left foot Varicose vein bleed  - in setting of Eliquis  use for afib -per EDP hemostasis achieved  - supportive care  - vascular consult for definitive treatment  -monitor h/h  -Dr Pearline to see in am  from vascular  Symptomatic anemia of blood loss -noted 3 gram drop in hgb  - repeat hgb now, with type and cross  -if continued drop with persistent symptoms of orthostasis and relative hypotension will transfuse ,  or if < 8    Atrial fibrillation - on Eliquis  on hold due to acute bleed  -continue on amiodarone   Leukocytosis -check UA, cxr  to be complete  -repeat labs pending    Chronic diastolic heart failure - on GDMT  - hold for now in setting of lower bp  -resume as bp allows  -appears well compensated   Hypothyroidism  -resume synthroid      DVT prophylaxis:  compression stockings Code Status: full/ as discussed per patient wishes in event of cardiac arrest  Family Communication: Con Alm Blades, Emergency Contact [phone] (Mobile)  Disposition Plan: patient  expected to be admitted less than 2 midnights  Consults called: Dr Pearline vascular Admission status: progressive   Camila DELENA Ned MD [facility]   If 7PM-7AM, please contact night-coverage [email or web address] Password [facility]  05/14/2024, 4:44 AM        [1]  Allergies Allergen Reactions   Combigan [Brimonidine Tartrate-Timolol] Itching and Other (See Comments)    Red eye    Pneumococcal Vaccines Other (See Comments)    Pain in arms/legs--nerve issues   "

## 2024-05-14 NOTE — Care Management Obs Status (Signed)
 MEDICARE OBSERVATION STATUS NOTIFICATION   Patient Details  Name: MONQUE HAGGAR MRN: [id number] Date of Birth: Dec 06, 1931   Medicare Observation Status Notification Given:  Yes    Corean JAYSON Canary, RN 05/14/2024, 3:34 PM

## 2024-05-14 NOTE — ED Notes (Signed)
 Blue top sent

## 2024-05-14 NOTE — Progress Notes (Signed)
" ° °  PROGRESS NOTE    Laura Lee  FMW:[id number] DOB: Dec 07, 1931 DOA: 05/14/2024 PCP: Charlanne Fredia CROME, MD   Brief Narrative: Laura Lee is a 89 y.o. female with a history of atrial fibrillation on Eliquis , chronic diastolic heart failure, glaucoma, breast cancer, hypertension, hypothyroidism.  Patient presented secondary to bleeding from a varicose vein, complicated by Eliquis  use. Hemostasis achieved. Vascular surgery recommending compression stockings.   Assessment/Plan:  Acute left varicose vein bleed Secondary trauma and complicated by Eliquis  use. Hemostasis achieved in he ED. Vascular surgery consulted and recommend no ablation. -Vascular surgery: compression stockings  Acute blood loss anemia Baseline hemoglobin of 11-13. Hemoglobin of 9.5 g/dL, down to 8.5 g/dL. Secondary to bleeding from varicose vein. -Repeat CBC in AM  Leukocytosis Mild. Likely related to stress. Afebrile. No evidence of infection.  Chronic diastolic heart failure Stable. Prior to arrival medication(s) include Entresto  and Lasix   Paroxysmal atrial fibrillation Stable. Prior to arrival medication(s) include amiodarone  and Eliquis . -Continue Eliquis , amiodarone   Hypothyroidism -Continue Synthroid    DVT prophylaxis: Eliquis  Code Status:   Code Status: Full Code Family Communication: None at bedside Disposition Plan: Discharge pending stable hemoglobin and PT/OT   Consultants:  Vascular surgery  Procedures:  None  Antimicrobials: None    Subjective: Patient reports no specific concerns today other than some pain in her posterior left leg.  Objective: BP 107/79   Pulse 60   Temp 97.9 F (36.6 C)   Resp 12   Ht 5' 4 (1.626 m)   Wt 58.5 kg   SpO2 100%   BMI 22.14 kg/m   Examination:  General exam: Appears calm and comfortable. Respiratory system: Clear to auscultation. Respiratory effort normal. Cardiovascular system: S1 & S2 heard, RRR. Gastrointestinal system: Abdomen  is nondistended, soft and nontender. Normal bowel sounds heard. Central nervous system: Alert and oriented. Musculoskeletal: Trace edema. No calf tenderness Psychiatry: Judgement and insight appear normal. Mood & affect appropriate.    Data Reviewed: I have personally reviewed following labs and imaging studies   Last CBC Lab Results  Component Value Date   WBC 12.8 (H) 05/14/2024   HGB 8.5 (L) 05/14/2024   HCT 26.9 (L) 05/14/2024   MCV 89.8 05/14/2024   MCH 28.6 05/14/2024   RDW 15.5 05/14/2024   PLT  05/14/2024    PLATELET CLUMPS NOTED ON SMEAR, COUNT APPEARS ADEQUATE     Last metabolic panel Lab Results  Component Value Date   GLUCOSE 131 (H) 05/14/2024   NA 141 05/14/2024   K 3.8 05/14/2024   CL 105 05/14/2024   CO2 28 05/14/2024   BUN 29 (H) 05/14/2024   CREATININE 1.17 (H) 05/14/2024   GFRNONAA 44 (L) 05/14/2024   CALCIUM  8.0 (L) 05/14/2024   PROT 7.0 02/07/2024   ALBUMIN 4.1 01/11/2024   LABGLOB 3.2 01/11/2024   AGRATIO 1.2 04/08/2021   BILITOT 0.4 02/07/2024   ALKPHOS 81 01/11/2024   AST 18 02/07/2024   ALT 11 02/07/2024   ANIONGAP 7 05/14/2024     Creatinine Clearance: Estimated Creatinine Clearance: 28.1 mL/min (A) (by C-G formula based on SCr of 1.17 mg/dL (H)).  No results found for this or any previous visit (from the past 240 hours).    Radiology Studies: No results found.    LOS: 0 days    Elgin Lam, MD [facility] 05/14/2024, 6:40 AM   If 7PM-7AM, please contact night-coverage [email or web address]  "

## 2024-05-14 NOTE — ED Notes (Signed)
 Son Alm called and advised that patient was moved to room 3

## 2024-05-14 NOTE — ED Provider Notes (Signed)
 " [place] EMERGENCY DEPARTMENT AT [facility] Provider Note   CSN: [id number] Arrival date & time: 05/14/24  0147     Patient presents with: Varicose Veins   Laura Lee is a 89 y.o. female.   The history is provided by the patient and the EMS personnel.   She has history of breast cancer, thyroid  cancer, hypertension, atrial fibrillation anticoagulated on apixaban  and comes in after a varicose vein on her left foot started bleeding.  She is reported to have lost a lot of blood at the scene.  EMS noted significant hypotension and gave her IV fluids with improvement in blood pressure.    Prior to Admission medications  Medication Sig Start Date End Date Taking? Authorizing Provider  amiodarone  (PACERONE ) 200 MG tablet Take 1 tablet (200 mg total) by mouth daily. 12/14/23   Camnitz, Soyla Lunger, MD  ammonium lactate  (AMLACTIN) 12 % cream Apply a thin layer to affected areas up to twice daily. Avoid face, skin folds, and areas of broken skin. Discontinue if irritation occurs. 03/14/24     apixaban  (ELIQUIS ) 2.5 MG TABS tablet Take 1 tablet (2.5 mg total) by mouth 2 (two) times daily. 09/06/23   Duke, Jon Garre, PA  beta carotene w/minerals (OCUVITE) tablet Take 1 tablet by mouth at bedtime.    [provider]  Calcium  Citrate-Vitamin D  (CALCIUM  CITRATE + D3 PO) Take 1 tablet by mouth 2 (two) times daily.    [provider]  carboxymethylcellulose (REFRESH PLUS) 0.5 % SOLN Place 1 drop into both eyes 3 (three) times daily as needed (dry/irritated eyes.).     [provider]  cholecalciferol  (VITAMIN D ) 25 MCG (1000 UNIT) tablet Take 1,000 Units by mouth in the morning. chewables    [provider]  COVID-19 mRNA vaccine [facility]) syringe Inject 0.5 mLs into the muscle as directed. 02/09/24   Charlanne Fredia CROME, MD  diclofenac  Sodium (VOLTAREN ) 1 % GEL Apply 2 g topically 4 (four) times daily. 05/25/23   Duke, Jon Garre, PA  Emollient  (AQUAPHOR ADVANCED THERAPY) OINT Apply 1 Application topically 2 (two) times daily as needed.    [provider]  furosemide  (LASIX ) 20 MG tablet Take 3 tablets (60 mg total) by mouth daily. For the next 3 days take Lasix  40 mg twice a day, then continue with 60 mg once a day in the morning. 01/11/24   West, Katlyn D, NP  glucosamine-chondroitin 500-400 MG tablet Take 1 tablet by mouth in the morning and at bedtime.    [provider]  latanoprost  (XALATAN ) 0.005 % ophthalmic solution Place 1 drop into both eyes at bedtime.  05/22/18   [provider]  latanoprost  (XALATAN ) 0.005 % ophthalmic solution Place 1 drop into both eyes at bedtime. 03/29/24     levothyroxine  (SYNTHROID ) 100 MCG tablet Take 1 tablet (100 mcg total) by mouth daily before breakfast. 12/03/23   Charlanne Fredia CROME, MD  Multiple Vitamin (MULTIVITAMIN WITH MINERALS) TABS tablet Take 1 tablet by mouth daily with lunch. Adults 50+    [provider]  oxyCODONE  (OXY IR/ROXICODONE ) 5 MG immediate release tablet Take 1 tablet (5 mg total) by mouth 2 (two) times daily. 04/10/24   Wert, Christina, NP  Polyethylene Glycol 400 (BLINK TEARS) 0.25 % SOLN Place 1-2 drops into both eyes 3 (three) times daily as needed (dry/irritated eyes).    [provider]  potassium chloride  (KLOR-CON  M) 10 MEQ tablet Take 1 tablet (10 mEq total) by  mouth daily. 02/09/24   Charlanne Fredia CROME, MD  sacubitril -valsartan  (ENTRESTO ) 24-26 MG Take 1 tablet by mouth 2 (two) times daily. 05/09/24   Camnitz, Soyla Lunger, MD  triamcinolone  cream (KENALOG ) 0.1 % Apply to the affected areas twice daily for up to 2 weeks at a time. Avoid application to the face, groin, armpits, and skin folds 03/09/23       Allergies: Combigan [brimonidine tartrate-timolol] and Pneumococcal vaccines    Review of Systems  All other systems reviewed and are negative.   Updated Vital Signs BP (!) 102/52   Pulse (!) 55   Temp 98 F (36.7 C) (Oral)    Resp 16   Ht 5' 4 (1.626 m)   Wt 58.5 kg   SpO2 98%   BMI 22.14 kg/m   Physical Exam Vitals and nursing note reviewed.   89 year old female, resting comfortably and in no acute distress. Vital signs are significant for slightly slow heart rate. Oxygen saturation is 98%, which is normal. Head is normocephalic and atraumatic. PERRLA, EOMI. Lungs are clear without rales, wheezes, or rhonchi. Chest is nontender. Heart has regular rate and rhythm without murmur. Abdomen is soft, flat, nontender. Extremities have no cyanosis or edema.  Bleeding varicose vein noted on the dorsum of the left foot. Skin is warm and dry without rash. Neurologic: Mental status is normal, cranial nerves are intact, moves all extremities equally.  (all labs ordered are listed, but only abnormal results are displayed) Labs Reviewed  BASIC METABOLIC PANEL WITH GFR - Abnormal; Notable for the following components:      Result Value   Glucose, Bld 131 (*)    BUN 29 (*)    Creatinine, Ser 1.17 (*)    Calcium  8.0 (*)    GFR, Estimated 44 (*)    All other components within normal limits  CBC WITH DIFFERENTIAL/PLATELET - Abnormal; Notable for the following components:   WBC 12.8 (*)    RBC 3.32 (*)    Hemoglobin 9.5 (*)    HCT 29.8 (*)    Neutro Abs 10.0 (*)    Monocytes Absolute 1.2 (*)    Abs Immature Granulocytes 0.09 (*)    All other components within normal limits    EKG: None  Radiology: No results found.  Cardiac monitor shows sinus bradycardia, per my interpretation.  Wound repair  Date/Time: 05/14/2024 3:45 AM  Performed by: Raford Lenis, MD Authorized by: Raford Lenis, MD  Consent: Verbal consent obtained. Written consent not obtained Risks and benefits: risks, benefits and alternatives were discussed Consent given by: patient Patient understanding: patient states understanding of the procedure being performed Patient consent: the patient's understanding of the procedure matches  consent given Procedure consent: procedure consent matches procedure scheduled Relevant documents: relevant documents present and verified Test results: test results available and properly labeled Site marked: the operative site was marked Required items: required blood products, implants, devices, and special equipment available Patient identity confirmed: verbally with patient and arm band Time out: Immediately prior to procedure a time out was called to verify the correct patient, procedure, equipment, support staff and site/side marked as required. Preparation: Patient was prepped and draped in the usual sterile fashion. Local anesthesia used: yes Anesthesia: local infiltration  Anesthesia: Local anesthesia used: yes Local Anesthetic: lidocaine  1% with epinephrine  Anesthetic total: 2 mL  Sedation: Patient sedated: no  Patient tolerance: patient tolerated the procedure well with no immediate complications Comments: Site of varicose vein bleeding was identified, a single  4-0 Prolene figure-of-eight suture was placed with good hemostasis.      Medications Ordered in the ED  lidocaine -EPINEPHrine  (XYLOCAINE  W/EPI) 2 %-1:200000 (PF) injection 20 mL (has no administration in time range)                                    Medical Decision Making Amount and/or Complexity of Data Reviewed Labs: ordered.  Risk Prescription drug management. Decision regarding hospitalization.   Bleeding from varicose vein and patient was anticoagulated.  Blood pressure is adequate in the emergency department.  I have reviewed her past records, and note from office visits that blood pressure generally runs from 100 systolic to 110 systolic.  I have reviewed her laboratory tests, and my interpretation is elevated random glucose level, elevated BUN and creatinine slightly worse than baseline, mild leukocytosis which is nonspecific, anemia which is new - 3.2 g drop compared with 02/07/2024.  I applied  a figure-of-eight suture to her bleeding varicose vein with good hemostasis.  I have ordered orthostatic vital signs and a trial of the ambulation.  Patient got dizzy on standing, was not able to ambulate.  Blood pressure had dropped from 105 systolic to 91 systolic.  She is not safe for discharge.  I have ordered additional IV fluids.  She will need to have her hemoglobin rechecked to see how low it drops, may need blood transfusion.  I have discussed case with Dr. Debby of [facility], who agrees to admit the patient.     Final diagnoses:  Bleeding from varicose vein  Anemia due to acute blood loss  Anticoagulated on apixaban   Elevated random blood glucose level    ED Discharge Orders     None          Raford Lenis, MD 05/14/24 [phone]  "

## 2024-05-14 NOTE — ED Notes (Signed)
 Assisted patient in setting up her meal tray. Pt side rail let down per her request so she could eat on the side of the bed.

## 2024-05-15 DIAGNOSIS — D72829 Elevated white blood cell count, unspecified: Secondary | ICD-10-CM

## 2024-05-15 DIAGNOSIS — I83899 Varicose veins of unspecified lower extremities with other complications: Secondary | ICD-10-CM | POA: Diagnosis not present

## 2024-05-15 DIAGNOSIS — D62 Acute posthemorrhagic anemia: Secondary | ICD-10-CM | POA: Insufficient documentation

## 2024-05-15 LAB — CBC
HCT: 26.5 % — ABNORMAL LOW (ref 36.0–46.0)
Hemoglobin: 8.6 g/dL — ABNORMAL LOW (ref 12.0–15.0)
MCH: 28.9 pg (ref 26.0–34.0)
MCHC: 32.5 g/dL (ref 30.0–36.0)
MCV: 88.9 fL (ref 80.0–100.0)
Platelets: UNDETERMINED K/uL (ref 150–400)
RBC: 2.98 MIL/uL — ABNORMAL LOW (ref 3.87–5.11)
RDW: 15.5 % (ref 11.5–15.5)
WBC: 7.3 K/uL (ref 4.0–10.5)
nRBC: 0 % (ref 0.0–0.2)

## 2024-05-15 NOTE — Plan of Care (Signed)

## 2024-05-15 NOTE — TOC CM/SW Note (Signed)
 Transition of Care [facility]) - Inpatient Brief Assessment   Patient Details  Name: Laura Lee MRN: [id number] Date of Birth: June 16, 1931  [facility]) CM/SW Contact:    Waddell Barnie Rama, RN Phone Number: 05/15/2024, 10:39 AM   Clinical Narrative: From [facility], has PCP and insurance on file, states has no HH services in place at this time , has a walker at home.  States family member  (son) will transport them home at [employer] and family is support system, states gets medications from [facility] self ambulatory with walker.   There are no ICM  needs identified  at this time.  Please place consult for ICM needs.     Transition of Care Asessment: Insurance and Status: Insurance coverage has been reviewed Patient has primary care physician: Yes Home environment has been reviewed: from [facility] Prior level of function:: ambulatory with walker Prior/Current Home Services: Current home services (walker) Social Drivers of Health Review: SDOH reviewed no interventions necessary Readmission risk has been reviewed: Yes Transition of care needs: no transition of care needs at this time

## 2024-05-15 NOTE — TOC Transition Note (Signed)
 Transition of Care [facility]) - Discharge Note   Patient Details  Name: Laura Lee MRN: [id number] Date of Birth: Nov 20, 1931  Transition of Care [facility]) CM/SW Contact:  Waddell Barnie Rama, RN Phone Number: 05/15/2024, 10:40 AM   Clinical Narrative:    For dc to [facility] apartment today, she states her son will transport her.         Patient Goals and CMS Choice            Discharge Placement                       Discharge Plan and Services Additional resources added to the After Visit Summary for                                       Social Drivers of Health (SDOH) Interventions SDOH Screenings   Food Insecurity: No Food Insecurity (05/14/2024)  Housing: Low Risk (05/14/2024)  Transportation Needs: No Transportation Needs (05/14/2024)  Utilities: Not At Risk (05/14/2024)  Alcohol  Screen: Low Risk (10/26/2022)  Depression (PHQ2-9): Low Risk (03/02/2024)  Financial Resource Strain: Low Risk (10/26/2022)  Physical Activity: Sufficiently Active (10/26/2022)  Social Connections: Moderately Integrated (05/14/2024)  Stress: No Stress Concern Present (10/26/2022)  Tobacco Use: Low Risk (05/14/2024)     Readmission Risk Interventions    05/15/2024   10:38 AM  Readmission Risk Prevention Plan  Transportation Screening Complete  PCP or Specialist Appt within 5-7 Days Complete  Home Care Screening Complete  Medication Review (RN CM) Complete

## 2024-05-15 NOTE — Discharge Instructions (Signed)
 Laura Lee,  You were in the hospital with bleeding from your leg from a vein. This was complicated by Eliquis . Your bleeding has resolved and your blood counts are stable. Please follow-up with your PCP. I recommend holding your blood pressure medication for now, because your blood pressure was lowered from your bleeding.

## 2024-05-15 NOTE — Progress Notes (Signed)
 OT Cancellation Note  Patient Details Name: TANISHKA DROLET MRN: [id number] DOB: 05-20-1932   Cancelled Treatment:    Reason Eval/Treat Not Completed: OT screened, no needs identified, will sign off (Discussed with PT)  Shakirra Buehler,HILLARY 05/15/2024, 10:08 AM Kreg Sink, OT/L   Acute OT Clinical Specialist Acute Rehabilitation Services Pager [phone] Office [phone]

## 2024-05-15 NOTE — Discharge Summary (Signed)
 " Physician Discharge Summary   Patient: Laura Lee MRN: [id number] DOB: [birth year]  Admit date:     05/14/2024  Discharge date: 05/15/2024  Discharge Physician: Elgin Lam, MD   PCP: Charlanne Fredia CROME, MD   Recommendations at discharge:  PCP visit for hospital follow-up  Discharge Diagnoses: Active Problems:   Paroxysmal atrial fibrillation (HCC)   Acute blood loss anemia  Principal Problem (Resolved):   Bleeding from varicose vein Resolved Problems:   Leukocytosis  Hospital Course: Laura Lee is a 89 y.o. female with a history of atrial fibrillation on Eliquis , chronic diastolic heart failure, glaucoma, breast cancer, hypertension, hypothyroidism.  Patient presented secondary to bleeding from a varicose vein, complicated by Eliquis  use. Hemostasis achieved. Vascular surgery recommending compression stockings. Patient's hemoglobin was stable prior to discharge.  Assessment and Plan:  Acute left varicose vein bleed Secondary trauma and complicated by Eliquis  use. Hemostasis achieved in he ED. Vascular surgery consulted and recommend no ablation.   Acute blood loss anemia Baseline hemoglobin of 11-13. Hemoglobin of 9.5 g/dL, down to 8.5 g/dL. Secondary to bleeding from varicose vein. Hemoglobin stable at 8.6 g/dL prior to discharge. Patient evaluated by PT/OT with recommendation for no follow-up.   Leukocytosis Mild. Likely related to stress. Afebrile. No evidence of infection. Resolved prior to discharge.   Chronic diastolic heart failure Stable. Prior to arrival medication(s) include Entresto  and Lasix    Paroxysmal atrial fibrillation Stable. Prior to arrival medication(s) include amiodarone  and Eliquis . Continue Eliquis  and amiodarone  on discharge.   Hypothyroidism Continue Synthroid .  Consultants: Vascular surgery Procedures performed: None  Disposition: Home/Independent living facility Diet recommendation: Regular diet   DISCHARGE MEDICATION: Allergies as  of 05/15/2024       Reactions   Combigan [brimonidine Tartrate-timolol] Itching, Other (See Comments)   Red eye   Pneumococcal Vaccines Other (See Comments)   Pain in arms/legs--nerve issues        Medication List     PAUSE taking these medications    sacubitril -valsartan  24-26 MG Wait to take this until your doctor or other care provider tells you to start again. Commonly known as: ENTRESTO  Take 1 tablet by mouth 2 (two) times daily.       STOP taking these medications    COVID-19 mRNA vaccine syringe Commonly known as: SPIKEVAX       TAKE these medications    amiodarone  200 MG tablet Commonly known as: PACERONE  Take 1 tablet (200 mg total) by mouth daily.   ammonium lactate  12 % cream Commonly known as: AMLACTIN Apply a thin layer to affected areas up to twice daily. Avoid face, skin folds, and areas of broken skin. Discontinue if irritation occurs.   Aquaphor Advanced Therapy Oint Apply 1 Application topically 2 (two) times daily as needed.   beta carotene w/minerals tablet Take 1 tablet by mouth at bedtime.   CALCIUM  PO Take 1 tablet by mouth daily.   carboxymethylcellulose 0.5 % Soln Commonly known as: REFRESH PLUS Place 1 drop into both eyes 3 (three) times daily as needed (dry/irritated eyes.).   cholecalciferol  25 MCG (1000 UNIT) tablet Commonly known as: VITAMIN D3 Take 1,000 Units by mouth in the morning. chewables   Eliquis  2.5 MG Tabs tablet Generic drug: apixaban  Take 1 tablet (2.5 mg total) by mouth 2 (two) times daily.   furosemide  20 MG tablet Commonly known as: LASIX  Take 3 tablets (60 mg total) by mouth daily. For the next 3 days take Lasix  40 mg twice a day, then  continue with 60 mg once a day in the morning.   glucosamine-chondroitin 500-400 MG tablet Take 1 tablet by mouth in the morning and at bedtime.   latanoprost  0.005 % ophthalmic solution Commonly known as: XALATAN  Place 1 drop into both eyes at bedtime.    levothyroxine  100 MCG tablet Commonly known as: SYNTHROID  Take 1 tablet (100 mcg total) by mouth daily before breakfast.   multivitamin with minerals Tabs tablet Take 1 tablet by mouth daily with lunch. Adults 50+   oxyCODONE  5 MG immediate release tablet Commonly known as: Oxy IR/ROXICODONE  Take 1 tablet (5 mg total) by mouth 2 (two) times daily. What changed: when to take this   potassium chloride  10 MEQ tablet Commonly known as: KLOR-CON  M Take 1 tablet (10 mEq total) by mouth daily.        Discharge Exam: BP 123/76 (BP Location: Right Arm)   Pulse 71   Temp 98.3 F (36.8 C) (Oral)   Resp 18   Ht 5' 4 (1.626 m)   Wt 58.3 kg   SpO2 96%   BMI 22.06 kg/m   General exam: Appears calm and comfortable. Respiratory system: Clear to auscultation. Respiratory effort normal. Cardiovascular system: S1 & S2 heard, Normal rate with regular rhythm. Gastrointestinal system: Abdomen is nondistended, soft and nontender. Normal bowel sounds heard. Central nervous system: Alert and oriented. No focal neurological deficits. Musculoskeletal: Ankle edema. No calf tenderness Psychiatry: Judgement and insight appear normal. Mood & affect appropriate.   Condition at discharge: stable  The results of significant diagnostics from this hospitalization (including imaging, microbiology, ancillary and laboratory) are listed below for reference.   Imaging Studies: No results found.  Microbiology: Results for orders placed or performed during the hospital encounter of 09/09/20  SARS CORONAVIRUS 2 (TAT 6-24 HRS) Nasopharyngeal Nasopharyngeal Swab     Status: None   Collection Time: 09/09/20  9:31 AM   Specimen: Nasopharyngeal Swab  Result Value Ref Range Status   SARS Coronavirus 2 NEGATIVE NEGATIVE Final    Comment: (NOTE) SARS-CoV-2 target nucleic acids are NOT DETECTED.  The SARS-CoV-2 RNA is generally detectable in upper and lower respiratory specimens during the acute phase of  infection. Negative results do not preclude SARS-CoV-2 infection, do not rule out co-infections with other pathogens, and should not be used as the sole basis for treatment or other patient management decisions. Negative results must be combined with clinical observations, patient history, and epidemiological information. The expected result is Negative.  Fact Sheet for Patients: hairslick.no  Fact Sheet for Healthcare Providers: quierodirigir.com  This test is not yet approved or cleared by the [place]  FDA and  has been authorized for detection and/or diagnosis of SARS-CoV-2 by FDA under an Emergency Use Authorization (EUA). This EUA will remain  in effect (meaning this test can be used) for the duration of the COVID-19 declaration under Se ction 564(b)(1) of the Act, 21 U.S.C. section 360bbb-3(b)(1), unless the authorization is terminated or revoked sooner.  Performed at [facility], [address], [place], [place] [address]     Labs: CBC: Recent Labs  Lab 05/14/24 0156 05/14/24 0444 05/15/24 0256  WBC 12.8*  --  7.3  NEUTROABS 10.0*  --   --   HGB 9.5* 8.5* 8.6*  HCT 29.8* 26.9* 26.5*  MCV 89.8  --  88.9  PLT PLATELET CLUMPS NOTED ON SMEAR, COUNT APPEARS ADEQUATE  --  PLATELET CLUMPS NOTED ON SMEAR, UNABLE TO ESTIMATE   Basic Metabolic Panel: Recent  Labs  Lab 05/14/24 0156  NA 141  K 3.8  CL 105  CO2 28  GLUCOSE 131*  BUN 29*  CREATININE 1.17*  CALCIUM  8.0*    Discharge time spent: 35 minutes.  Signed: Elgin Lam, MD [facility] 05/15/2024 "

## 2024-05-15 NOTE — Hospital Course (Signed)
 Laura Lee is a 89 y.o. female with a history of atrial fibrillation on Eliquis , chronic diastolic heart failure, glaucoma, breast cancer, hypertension, hypothyroidism.  Patient presented secondary to bleeding from a varicose vein, complicated by Eliquis  use. Hemostasis achieved. Vascular surgery recommending compression stockings. Patient's hemoglobin was stable prior to discharge.

## 2024-05-15 NOTE — Evaluation (Signed)
 Physical Therapy Evaluation and Discharge Patient Details Name: Laura Lee MRN: [id number] DOB: 03/21/1932 Today's Date: 05/15/2024  History of Present Illness  Pt is 89 yo female who presents on 05/14/24 with varicose vein bleeding on L foot from stubbing toe. Pt bradycardic and hypotensive in ED.  PMH: Afib, HTN, CHF, h/o breast cancer and thyroid  cancer, glaucoma  Clinical Impression  Patient evaluated by Physical Therapy with no further acute PT needs identified. All education has been completed and the patient has no further questions. Pt from independent living at [facility]. Uses rollator for hallway mobility, no AD in apt. Denies recent falls. Pt mod I on eval and ambulated 250' with RW. HR 86 bpm, SPO2 98% on RA. Reviewed posture and postural exercises for her chronic neck pain. Requested mobility team follow if pt does not d/c today.  PT is signing off. Thank you for this referral.         If plan is discharge home, recommend the following:     Can travel by private vehicle        Equipment Recommendations None recommended by PT  Recommendations for Other Services       Functional Status Assessment Patient has not had a recent decline in their functional status     Precautions / Restrictions Precautions Precautions: None Restrictions Weight Bearing Restrictions Per Provider Order: No      Mobility  Bed Mobility Overal bed mobility: Modified Independent             General bed mobility comments: no assist needed to get in and out of bed    Transfers Overall transfer level: Needs assistance Equipment used: Rolling walker (2 wheels) Transfers: Sit to/from Stand Sit to Stand: Modified independent (Device/Increase time)           General transfer comment: pt stood safely with correct hand placement, to RW    Ambulation/Gait Ambulation/Gait assistance: Modified independent (Device/Increase time) Gait Distance (Feet): 250 Feet Assistive  device: Rolling walker (2 wheels) Gait Pattern/deviations: Step-through pattern Gait velocity: decreased Gait velocity interpretation: >2.62 ft/sec, indicative of community ambulatory Pre-gait activities: LAQ bilaterally x5 General Gait Details: pt with increasing fwd flexed posture with distance, talked through proper posture esp in relation to her chronic neck pain. Pt voiced understanding  Stairs            Wheelchair Mobility     Tilt Bed    Modified Rankin (Stroke Patients Only)       Balance Overall balance assessment: Mild deficits observed, not formally tested                                           Pertinent Vitals/Pain Pain Assessment Pain Assessment: Faces Faces Pain Scale: Hurts little more Pain Location: neck, chronic Pain Descriptors / Indicators: Sore Pain Intervention(s): Limited activity within patient's tolerance, Monitored during session    Home Living Family/patient expects to be discharged to:: Private residence Living Arrangements: Alone Available Help at Discharge: Family;Available PRN/intermittently Type of Home: Independent living facility Home Access: Level entry       Home Layout: One level Home Equipment: Rollator (4 wheels) Additional Comments: pt lives in independent living at [facility]. Son lives in the area    Prior Function Prior Level of Function : Independent/Modified Independent             Mobility  Comments: ambulates in apt without AD, in hallways with rollator ADLs Comments: inependent     Extremity/Trunk Assessment   Upper Extremity Assessment Upper Extremity Assessment: Overall WFL for tasks assessed    Lower Extremity Assessment Lower Extremity Assessment: Overall WFL for tasks assessed (bilateral knee OA)    Cervical / Trunk Assessment Cervical / Trunk Assessment: Kyphotic  Communication   Communication Communication: Impaired Factors Affecting Communication: Hearing  impaired    Cognition Arousal: Alert Behavior During Therapy: WFL for tasks assessed/performed   PT - Cognitive impairments: No apparent impairments                         Following commands: Intact       Cueing Cueing Techniques: Verbal cues, Gestural cues     General Comments General comments (skin integrity, edema, etc.): HR 86 bpm, SPO2 98%, 2/4 DOE at end of ambulation    Exercises     Assessment/Plan    PT Assessment Patient does not need any further PT services  PT Problem List         PT Treatment Interventions      PT Goals (Current goals can be found in the Care Plan section)  Acute Rehab PT Goals Patient Stated Goal: return home PT Goal Formulation: All assessment and education complete, DC therapy    Frequency       Co-evaluation               AM-PAC PT 6 Clicks Mobility  Outcome Measure Help needed turning from your back to your side while in a flat bed without using bedrails?: None Help needed moving from lying on your back to sitting on the side of a flat bed without using bedrails?: None Help needed moving to and from a bed to a chair (including a wheelchair)?: None Help needed standing up from a chair using your arms (e.g., wheelchair or bedside chair)?: None Help needed to walk in hospital room?: None Help needed climbing 3-5 steps with a railing? : None 6 Click Score: 24    End of Session   Activity Tolerance: Patient tolerated treatment well Patient left: in bed;with call bell/phone within reach;with bed alarm set Nurse Communication: Mobility status PT Visit Diagnosis: Difficulty in walking, not elsewhere classified (R26.2);Pain Pain - part of body:  (neck)    Time: 9142-9086 PT Time Calculation (min) (ACUTE ONLY): 16 min   Charges:   PT Evaluation $PT Eval Low Complexity: 1 Low   PT General Charges $$ ACUTE PT VISIT: 1 Visit         Richerd Lipoma, PT  Acute Rehab Services Secure chat  preferred Office [phone]   Richerd CROME Elaura Calix 05/15/2024, 9:37 AM

## 2024-05-22 ENCOUNTER — Emergency Department (HOSPITAL_COMMUNITY)

## 2024-05-22 ENCOUNTER — Other Ambulatory Visit: Payer: Self-pay

## 2024-05-22 ENCOUNTER — Emergency Department (HOSPITAL_COMMUNITY)
Admission: EM | Admit: 2024-05-22 | Discharge: 2024-05-22 | Disposition: A | Discharge status: Home / Self Care | Source: Skilled Nursing Facility | Attending: Emergency Medicine | Admitting: Emergency Medicine

## 2024-05-22 DIAGNOSIS — N183 Chronic kidney disease, stage 3 unspecified: Secondary | ICD-10-CM | POA: Diagnosis not present

## 2024-05-22 DIAGNOSIS — I509 Heart failure, unspecified: Secondary | ICD-10-CM | POA: Diagnosis not present

## 2024-05-22 DIAGNOSIS — I4891 Unspecified atrial fibrillation: Secondary | ICD-10-CM | POA: Insufficient documentation

## 2024-05-22 DIAGNOSIS — I130 Hypertensive heart and chronic kidney disease with heart failure and stage 1 through stage 4 chronic kidney disease, or unspecified chronic kidney disease: Secondary | ICD-10-CM | POA: Diagnosis not present

## 2024-05-22 DIAGNOSIS — Z7901 Long term (current) use of anticoagulants: Secondary | ICD-10-CM | POA: Insufficient documentation

## 2024-05-22 DIAGNOSIS — Z79899 Other long term (current) drug therapy: Secondary | ICD-10-CM | POA: Insufficient documentation

## 2024-05-22 DIAGNOSIS — Z853 Personal history of malignant neoplasm of breast: Secondary | ICD-10-CM | POA: Insufficient documentation

## 2024-05-22 DIAGNOSIS — E039 Hypothyroidism, unspecified: Secondary | ICD-10-CM | POA: Insufficient documentation

## 2024-05-22 DIAGNOSIS — R42 Dizziness and giddiness: Secondary | ICD-10-CM | POA: Diagnosis present

## 2024-05-22 DIAGNOSIS — Z85850 Personal history of malignant neoplasm of thyroid: Secondary | ICD-10-CM | POA: Diagnosis not present

## 2024-05-22 LAB — URINALYSIS, ROUTINE W REFLEX MICROSCOPIC
Bilirubin Urine: NEGATIVE
Glucose, UA: NEGATIVE mg/dL
Hgb urine dipstick: NEGATIVE
Ketones, ur: NEGATIVE mg/dL
Nitrite: POSITIVE — AB
Protein, ur: NEGATIVE mg/dL
Specific Gravity, Urine: 1.005 (ref 1.005–1.030)
pH: 7 (ref 5.0–8.0)

## 2024-05-22 LAB — CBC WITH DIFFERENTIAL/PLATELET
Abs Immature Granulocytes: 0.02 K/uL (ref 0.00–0.07)
Basophils Absolute: 0.1 K/uL (ref 0.0–0.1)
Basophils Relative: 1 %
Eosinophils Absolute: 0.1 K/uL (ref 0.0–0.5)
Eosinophils Relative: 1 %
HCT: 28.6 % — ABNORMAL LOW (ref 36.0–46.0)
Hemoglobin: 9.1 g/dL — ABNORMAL LOW (ref 12.0–15.0)
Immature Granulocytes: 0 %
Lymphocytes Relative: 15 %
Lymphs Abs: 1 K/uL (ref 0.7–4.0)
MCH: 28.3 pg (ref 26.0–34.0)
MCHC: 31.8 g/dL (ref 30.0–36.0)
MCV: 89.1 fL (ref 80.0–100.0)
Monocytes Absolute: 0.8 K/uL (ref 0.1–1.0)
Monocytes Relative: 11 %
Neutro Abs: 4.7 K/uL (ref 1.7–7.7)
Neutrophils Relative %: 72 %
Platelets: UNDETERMINED K/uL (ref 150–400)
RBC: 3.21 MIL/uL — ABNORMAL LOW (ref 3.87–5.11)
RDW: 15.3 % (ref 11.5–15.5)
WBC: 6.6 K/uL (ref 4.0–10.5)
nRBC: 0 % (ref 0.0–0.2)

## 2024-05-22 LAB — BASIC METABOLIC PANEL WITH GFR
Anion gap: 9 (ref 5–15)
BUN: 23 mg/dL (ref 8–23)
CO2: 29 mmol/L (ref 22–32)
Calcium: 8.2 mg/dL — ABNORMAL LOW (ref 8.9–10.3)
Chloride: 105 mmol/L (ref 98–111)
Creatinine, Ser: 1.08 mg/dL — ABNORMAL HIGH (ref 0.44–1.00)
GFR, Estimated: 49 mL/min — ABNORMAL LOW
Glucose, Bld: 88 mg/dL (ref 70–99)
Potassium: 3.5 mmol/L (ref 3.5–5.1)
Sodium: 142 mmol/L (ref 135–145)

## 2024-05-22 LAB — MAGNESIUM: Magnesium: 2.1 mg/dL (ref 1.7–2.4)

## 2024-05-22 LAB — I-STAT CG4 LACTIC ACID, ED: Lactic Acid, Venous: 0.8 mmol/L (ref 0.5–1.9)

## 2024-05-22 MED ORDER — SODIUM CHLORIDE 0.9 % IV BOLUS
1000.0000 mL | Freq: Once | INTRAVENOUS | Status: AC
  Administered 2024-05-22: 1000 mL via INTRAVENOUS

## 2024-05-22 NOTE — ED Triage Notes (Signed)
 Pt BIBA from assisted living for feeling lightheaded throughout the day. Was found hypotensive at assisted living. HR 100-160. Aox4.

## 2024-05-22 NOTE — Discharge Instructions (Signed)
 We evaluated you for your lightheadedness and weakness.  You initially had atrial fibrillation with a fast heart rate but this resolved on a recheck.  Your blood tests are overall reassuring, and since you are feeling back to normal, we do not think you need to stay in the hospital.  Please follow-up closely with your cardiologist.  If you do have any recurrent symptoms, please return to the emergency department.

## 2024-05-22 NOTE — ED Provider Notes (Signed)
 MSE was initiated and I personally evaluated the patient and placed orders (if any) at  2:49 PM on May 22, 2024.  The patient appears stable so that the remainder of the MSE may be completed by another provider.  Patient is complaining of feeling lightheaded like she might pass out and her heart rate being fast.  She said she did not want to be alone if she was going to pass out so she talked with the caregivers at her facility who recommended she come here for further evaluation.  She has a history of atrial fibrillation.  She denies any chest pain or shortness of breath abdominal pain vomiting diarrhea fevers or chills.  She said she has been eating and drinking okay.  She is awake and alert in no distress.  Heart rate currently 99 and irregular.   Towana Ozell BROCKS, MD 05/22/24 [phone]

## 2024-05-22 NOTE — ED Notes (Signed)
Per provider pt can eat 

## 2024-05-22 NOTE — ED Provider Notes (Signed)
 " [place] EMERGENCY DEPARTMENT AT [facility] Provider Note  CSN: [id number] Arrival date & time: 05/22/24 1410  Chief Complaint(s) Tachycardia  HPI Laura Lee is a 89 y.o. female history of atrial fibrillation, hypertension, CHF, on Eliquis  presenting with lightheadedness.  Patient reports earlier was feeling lightheaded, had palpitations.  No chest pain.  Had some shortness of breath.  No fevers or chills, no abdominal pain or back pain.  No syncope.  Denies any recent fevers, chills, urinary symptoms, cough, runny nose, sore throat.  She reports shortly after arriving back to the emergency department she began feeling back to normal.  She currently reports she feels at baseline.   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   Bilateral edema of lower extremity 02/03/2023   Age-related osteoporosis without current pathological fracture 02/03/2023   Rash and nonspecific skin eruption 02/03/2023   Mild cognitive impairment 02/03/2023   Bilateral primary osteoarthritis of knee 09/16/2022   Bilateral hearing loss 09/16/2022   Glaucoma 09/16/2022   Other thrombophilia 09/16/2022   Primary insomnia 09/16/2022   Sciatica 09/16/2022   Thrombocytopenia 09/16/2022   Venous insufficiency 09/16/2022   Cellulitis 12/24/2020   CKD (chronic kidney disease) stage 3, GFR 30-59 ml/min (HCC) 08/20/2020   Folliculitis 01/15/2020   Slow transit constipation 12/19/2019   Dysphagia 12/19/2019   Hypothyroidism 12/19/2019   C2 cervical fracture (HCC) 10/26/2019   A-fib (HCC) 10/26/2019   Hypokalemia 10/26/2019    Paroxysmal atrial fibrillation (HCC)    Essential hypertension 05/26/2016   CHF (congestive heart failure) (HCC) 02/04/2016   Persistent atrial fibrillation (HCC) 02/04/2016   Home Medication(s) Prior to Admission medications  Medication Sig Start Date End Date Taking? Authorizing Provider  amiodarone  (PACERONE ) 200 MG tablet Take 1 tablet (200 mg total) by mouth daily. 12/14/23   Camnitz, Soyla Lunger, MD  ammonium lactate  (AMLACTIN) 12 % cream Apply a thin layer to affected areas up to twice daily. Avoid face, skin folds, and areas of broken skin. Discontinue if irritation occurs. 03/14/24     apixaban  (ELIQUIS ) 2.5 MG TABS tablet Take 1 tablet (2.5 mg total) by mouth 2 (two) times daily. 09/06/23   Duke, Jon Garre, PA  beta carotene w/minerals (OCUVITE) tablet Take 1 tablet by mouth at bedtime.    [provider]  CALCIUM  PO Take 1 tablet by mouth daily.    [provider]  carboxymethylcellulose (REFRESH PLUS) 0.5 % SOLN Place 1 drop into both eyes 3 (three) times daily as needed (dry/irritated eyes.).     [provider]  cholecalciferol  (VITAMIN D ) 25 MCG (1000 UNIT) tablet Take 1,000 Units by mouth in the morning. chewables    [provider]  Emollient (AQUAPHOR ADVANCED THERAPY) OINT Apply 1 Application topically 2 (two) times daily as needed.    [provider]  furosemide  (LASIX ) 20 MG tablet Take 3 tablets (60 mg total) by mouth daily. For the next 3 days take Lasix  40 mg twice a day, then continue with  60 mg once a day in the morning. 01/11/24   West, Katlyn D, NP  glucosamine-chondroitin 500-400 MG tablet Take 1 tablet by mouth in the morning and at bedtime.    [provider]  latanoprost  (XALATAN ) 0.005 % ophthalmic solution Place 1 drop into both eyes at bedtime. 03/29/24     levothyroxine  (SYNTHROID ) 100 MCG tablet Take 1 tablet (100 mcg total) by mouth daily before breakfast. 12/03/23   Charlanne Fredia CROME, MD  Multiple Vitamin  (MULTIVITAMIN WITH MINERALS) TABS tablet Take 1 tablet by mouth daily with lunch. Adults 50+    [provider]  oxyCODONE  (OXY IR/ROXICODONE ) 5 MG immediate release tablet Take 1 tablet (5 mg total) by mouth 2 (two) times daily. Patient taking differently: Take 5 mg by mouth at bedtime. 04/10/24   Darlean Maus, NP  potassium chloride  (KLOR-CON  M) 10 MEQ tablet Take 1 tablet (10 mEq total) by mouth daily. 02/09/24   Charlanne Fredia CROME, MD  [Paused] sacubitril -valsartan  (ENTRESTO ) 24-26 MG Take 1 tablet by mouth 2 (two) times daily. Wait to take this until your doctor or other care provider tells you to start again. 05/09/24   Inocencio Soyla Lunger, MD                                                                                                                                    Past Surgical History Past Surgical History:  Procedure Laterality Date   APPENDECTOMY     BREAST EXCISIONAL BIOPSY Right    benign   BREAST LUMPECTOMY     [phone] radiation   BREAST SURGERY  1989   Lumpectomy right breast   CARDIOVERSION N/A 02/14/2016   Procedure: CARDIOVERSION;  Surgeon: Wilbert JONELLE Bihari, MD;  Location: [facility] ENDOSCOPY;  Service: Cardiovascular;  Laterality: N/A;   CARDIOVERSION N/A 08/04/2018   Procedure: CARDIOVERSION;  Surgeon: Lonni Slain, MD;  Location: [facility] ENDOSCOPY;  Service: Cardiovascular;  Laterality: N/A;   CARDIOVERSION N/A 10/31/2019   Procedure: CARDIOVERSION;  Surgeon: Maranda Leim DEL, MD;  Location: [facility] ENDOSCOPY;  Service: Cardiovascular;  Laterality: N/A;   CARDIOVERSION N/A 05/02/2020   Procedure: CARDIOVERSION;  Surgeon: Loni Soyla LABOR, MD;  Location: [facility] ENDOSCOPY;  Service: Cardiovascular;  Laterality: N/A;   CARDIOVERSION N/A 09/10/2020   Procedure: CARDIOVERSION;  Surgeon: Santo Stanly LABOR, MD;  Location: [facility] ENDOSCOPY;  Service: Cardiovascular;  Laterality: N/A;   Mole excised     Benign   THYROID  SURGERY     Removed 1 lobe   TONSILLECTOMY      Family History Family History  Problem Relation Age of Onset   Breast cancer Mother 65   Lung cancer Sister    Heart attack Brother    Breast cancer Daughter 53    Social History Social History[1] Allergies Combigan [brimonidine tartrate-timolol] and Pneumococcal vaccines  Review of Systems Review of Systems  All other systems reviewed and are negative.   Physical Exam  Vital Signs  I have reviewed the triage vital signs BP (!) 136/91 (BP Location: Left Arm)   Pulse (!) 124   Temp 98.1 F (36.7 C) (Oral)   Resp 18   Ht 5' 4 (1.626 m)   Wt 58 kg   SpO2 100%   BMI 21.95 kg/m  Physical Exam Vitals and nursing note reviewed.  Constitutional:      General: She is not in acute distress.    Appearance: She is well-developed.  HENT:     Head: Normocephalic and atraumatic.     Mouth/Throat:     Mouth: Mucous membranes are moist.  Eyes:     Pupils: Pupils are equal, round, and reactive to light.  Cardiovascular:     Rate and Rhythm: Normal rate and regular rhythm.     Heart sounds: No murmur heard. Pulmonary:     Effort: Pulmonary effort is normal. No respiratory distress.     Breath sounds: Normal breath sounds.  Abdominal:     General: Abdomen is flat.     Palpations: Abdomen is soft.     Tenderness: There is no abdominal tenderness.  Musculoskeletal:        General: No tenderness.     Right lower leg: No edema.     Left lower leg: No edema.  Skin:    General: Skin is warm and dry.  Neurological:     General: No focal deficit present.     Mental Status: She is alert. Mental status is at baseline.  Psychiatric:        Mood and Affect: Mood normal.        Behavior: Behavior normal.     ED Results and Treatments Labs (all labs ordered are listed, but only abnormal results are displayed) Labs Reviewed  BASIC METABOLIC PANEL WITH GFR - Abnormal; Notable for the following components:      Result Value   Creatinine, Ser 1.08 (*)    Calcium  8.2 (*)     GFR, Estimated 49 (*)    All other components within normal limits  CBC WITH DIFFERENTIAL/PLATELET - Abnormal; Notable for the following components:   RBC 3.21 (*)    Hemoglobin 9.1 (*)    HCT 28.6 (*)    All other components within normal limits  URINALYSIS, ROUTINE W REFLEX MICROSCOPIC - Abnormal; Notable for the following components:   APPearance HAZY (*)    Nitrite POSITIVE (*)    Leukocytes,Ua SMALL (*)    Bacteria, UA MANY (*)    All other components within normal limits  MAGNESIUM  I-STAT CG4 LACTIC ACID , ED                                                                                                                          Radiology DG Chest Port 1 View Result Date: 05/22/2024 CLINICAL DATA:  Weakness and tachycardia. EXAM: PORTABLE CHEST 1 VIEW COMPARISON:  Chest CT from 2021. FINDINGS: The cardiac silhouette, mediastinal  and hilar contours are within normal limits for the patient's age. Right paraspinal density consistent with known neurogenic tumor as seen on prior imaging studies and most consistent with a schwannoma or neurofibroma. Focal eventration of the right hemidiaphragm. No infiltrates, edema or effusions. The bony thorax is intact. IMPRESSION: 1. No acute cardiopulmonary findings. 2. Right paraspinal density consistent with known neurogenic tumor. Electronically Signed   By: MYRTIS Stammer M.D.   On: 05/22/2024 17:20    Pertinent labs & imaging results that were available during my care of the patient were reviewed by me and considered in my medical decision making (see MDM for details).  Medications Ordered in ED Medications  sodium chloride  0.9 % bolus 1,000 mL (0 mLs Intravenous Stopped 05/22/24 1657)                                                                                                                                     Procedures Procedures  (including critical care time)  Medical Decision Making / ED Course   MDM:  89 year old presenting  to the emergency department with lightheadedness.  Patient overall well-appearing, physical examination without focal abnormality.  Heart currently regular rate and rhythm.  Reviewed prehospital EKG which shows atrial fibrillation.  On monitor she currently appears to be in normal sinus rhythm.  Initial heart rate was 124 but in the room patient's heart rate is around 60.  Will obtain repeat EKG.  Suspect symptoms are likely due to atrial fibrillation but will check basic laboratory testing, evaluate electrolytes, magnesium.  She denies any concerning symptoms for other underlying trigger of atrial fibrillation such as symptoms of underlying infection.  She reports now that she has a normal heart rate she feels back to normal.  If her workup is reassuring and she continues to feel well and at baseline anticipate likely discharge  Clinical Course as of 05/22/24 1748  Mon May 22, 2024  1747 Labs are overall reassuring.  Urinalysis with some bacteria but patient denies any urinary symptoms so would not treat this.  EKG initially showed A-fib but now shows normal sinus rhythm.  She continues to report that she feels at baseline.  Chest x-ray is clear.  Discussed with patient and son and they feel comfortable discharge.  Advised follow-up with her electrophysiologist and discussed return precautions for any new or recurrent symptoms. Will discharge patient to home. All questions answered. Patient comfortable with plan of discharge. Return precautions discussed with patient and specified on the after visit summary.  [WS]    Clinical Course User Index [WS] Francesca Elsie CROME, MD     Additional history obtained: -Additional history obtained from ems, son -External records from outside source obtained and reviewed including: Chart review including previous notes, labs, imaging, consultation notes including prior notes    Lab Tests: -I ordered, reviewed, and interpreted labs.   The pertinent results  include:   Labs Reviewed  BASIC METABOLIC PANEL WITH GFR - Abnormal; Notable for the following components:      Result Value   Creatinine, Ser 1.08 (*)    Calcium  8.2 (*)    GFR, Estimated 49 (*)    All other components within normal limits  CBC WITH DIFFERENTIAL/PLATELET - Abnormal; Notable for the following components:   RBC 3.21 (*)    Hemoglobin 9.1 (*)    HCT 28.6 (*)    All other components within normal limits  URINALYSIS, ROUTINE W REFLEX MICROSCOPIC - Abnormal; Notable for the following components:   APPearance HAZY (*)    Nitrite POSITIVE (*)    Leukocytes,Ua SMALL (*)    Bacteria, UA MANY (*)    All other components within normal limits  MAGNESIUM  I-STAT CG4 LACTIC ACID , ED    Notable for reassuring labs   EKG   EKG Interpretation Date/Time:  Monday May 22 2024 16:23:47 EST Ventricular Rate:  55 PR Interval:  245 QRS Duration:  131 QT Interval:  501 QTC Calculation: 480 R Axis:   -77  Text Interpretation: Sinus or ectopic atrial rhythm Prolonged PR interval Nonspecific IVCD with LAD Left ventricular hypertrophy Borderline T abnormalities, diffuse leads No significant change since 05/03/24 Confirmed by Francesca Fallow ([id number]) on 05/22/2024 5:28:15 PM         Imaging Studies ordered: I ordered imaging studies including CXR On my interpretation imaging demonstrates no acute process I independently visualized and interpreted imaging. I agree with the radiologist interpretation   Medicines ordered and prescription drug management: Meds ordered this encounter  Medications   sodium chloride  0.9 % bolus 1,000 mL    -I have reviewed the patients home medicines and have made adjustments as needed     Cardiac Monitoring: The patient was maintained on a cardiac monitor.  I personally viewed and interpreted the cardiac monitored which showed an underlying rhythm of: NSR  Reevaluation: After the interventions noted above, I reevaluated the patient  and found that their symptoms have resolved  Co morbidities that complicate the patient evaluation  Past Medical History:  Diagnosis Date   Atrial fibrillation (HCC)    Breast CA (HCC)    Cancer (HCC)    Breast and thyroid  cancer   Glaucoma    Hearing impaired    Hearing aids   Hypertension    Osteopenia 05/2013   T score -1.9 FRAX 15%/4.3%   Thyroid  disease    Cancer      Dispostion: Disposition decision including need for hospitalization was considered, and patient discharged from emergency department.    Final Clinical Impression(s) / ED Diagnoses Final diagnoses:  Atrial fibrillation with RVR (HCC)     This chart was dictated using voice recognition software.  Despite best efforts to proofread,  errors can occur which can change the documentation meaning.     [1]  Social History Tobacco Use   Smoking status: Never   Smokeless tobacco: Never  Vaping Use   Vaping status: Never Used  Substance Use Topics   Alcohol  use: Not Currently   Drug use: No     Francesca Fallow CROME, MD 05/22/24 1748  "

## 2024-06-05 ENCOUNTER — Other Ambulatory Visit: Payer: Self-pay | Admitting: Internal Medicine

## 2024-06-05 ENCOUNTER — Other Ambulatory Visit (HOSPITAL_COMMUNITY): Payer: Self-pay

## 2024-06-05 ENCOUNTER — Other Ambulatory Visit: Payer: Self-pay

## 2024-06-05 LAB — COMPLETE METABOLIC PANEL WITHOUT GFR
AG Ratio: 1 (calc) (ref 1.0–2.5)
ALT: 10 U/L (ref 6–29)
AST: 17 U/L (ref 10–35)
Albumin: 3.5 g/dL — ABNORMAL LOW (ref 3.6–5.1)
Alkaline phosphatase (APISO): 76 U/L (ref 37–153)
BUN/Creatinine Ratio: 26 (calc) — ABNORMAL HIGH (ref 6–22)
BUN: 25 mg/dL (ref 7–25)
CO2: 30 mmol/L (ref 20–32)
Calcium: 8.5 mg/dL — ABNORMAL LOW (ref 8.6–10.4)
Chloride: 100 mmol/L (ref 98–110)
Creat: 0.98 mg/dL — ABNORMAL HIGH (ref 0.60–0.95)
Globulin: 3.4 g/dL (ref 1.9–3.7)
Glucose, Bld: 89 mg/dL (ref 65–139)
Potassium: 3.8 mmol/L (ref 3.5–5.3)
Sodium: 137 mmol/L (ref 135–146)
Total Bilirubin: 0.4 mg/dL (ref 0.2–1.2)
Total Protein: 6.9 g/dL (ref 6.1–8.1)

## 2024-06-05 LAB — CBC WITH DIFFERENTIAL/PLATELET
Absolute Lymphocytes: 1201 {cells}/uL (ref 850–3900)
Absolute Monocytes: 634 {cells}/uL (ref 200–950)
Basophils Absolute: 73 {cells}/uL (ref 0–200)
Basophils Relative: 1.4 %
Eosinophils Absolute: 213 {cells}/uL (ref 15–500)
Eosinophils Relative: 4.1 %
HCT: 29.9 % — ABNORMAL LOW (ref 35.9–46.0)
Hemoglobin: 9.2 g/dL — ABNORMAL LOW (ref 11.7–15.5)
MCH: 26.4 pg — ABNORMAL LOW (ref 27.0–33.0)
MCHC: 30.8 g/dL — ABNORMAL LOW (ref 31.6–35.4)
MCV: 85.9 fL (ref 81.4–101.7)
Monocytes Relative: 12.2 %
Neutro Abs: 3078 {cells}/uL (ref 1500–7800)
Neutrophils Relative %: 59.2 %
RBC: 3.48 Million/uL — ABNORMAL LOW (ref 3.80–5.10)
RDW: 14.1 % (ref 11.0–15.0)
Total Lymphocyte: 23.1 %
WBC: 5.2 Thousand/uL (ref 3.8–10.8)

## 2024-06-05 LAB — LIPID PANEL
Cholesterol: 165 mg/dL
HDL: 76 mg/dL
LDL Cholesterol (Calc): 76 mg/dL
Non-HDL Cholesterol (Calc): 89 mg/dL
Total CHOL/HDL Ratio: 2.2 (calc)
Triglycerides: 51 mg/dL

## 2024-06-05 LAB — TSH: TSH: 3.96 m[IU]/L (ref 0.40–4.50)

## 2024-06-05 LAB — VITAMIN D 25 HYDROXY (VIT D DEFICIENCY, FRACTURES): Vit D, 25-Hydroxy: 93 ng/mL (ref 30–100)

## 2024-06-05 MED ORDER — LEVOTHYROXINE SODIUM 100 MCG PO TABS
100.0000 ug | ORAL_TABLET | Freq: Every day | ORAL | 1 refills | Status: AC
  Filled 2024-06-05: qty 90, 90d supply, fill #0

## 2024-06-06 ENCOUNTER — Telehealth: Payer: Self-pay

## 2024-06-06 ENCOUNTER — Other Ambulatory Visit (HOSPITAL_COMMUNITY): Payer: Self-pay

## 2024-06-06 NOTE — Telephone Encounter (Signed)
 Platelets test results were canceled due to significant platelet clumping

## 2024-06-07 ENCOUNTER — Other Ambulatory Visit (HOSPITAL_COMMUNITY): Payer: Self-pay

## 2024-06-07 ENCOUNTER — Other Ambulatory Visit (HOSPITAL_BASED_OUTPATIENT_CLINIC_OR_DEPARTMENT_OTHER): Payer: Self-pay

## 2024-06-07 ENCOUNTER — Non-Acute Institutional Stay: Payer: Self-pay | Admitting: Internal Medicine

## 2024-06-07 ENCOUNTER — Other Ambulatory Visit: Payer: Self-pay

## 2024-06-07 ENCOUNTER — Encounter: Payer: Self-pay | Admitting: Internal Medicine

## 2024-06-07 VITALS — BP 114/68 | HR 64 | Temp 98.3°F | Resp 18 | Ht 64.0 in | Wt 127.6 lb

## 2024-06-07 DIAGNOSIS — R21 Rash and other nonspecific skin eruption: Secondary | ICD-10-CM

## 2024-06-07 DIAGNOSIS — Z4802 Encounter for removal of sutures: Secondary | ICD-10-CM

## 2024-06-07 DIAGNOSIS — I480 Paroxysmal atrial fibrillation: Secondary | ICD-10-CM

## 2024-06-07 DIAGNOSIS — I5022 Chronic systolic (congestive) heart failure: Secondary | ICD-10-CM | POA: Diagnosis not present

## 2024-06-07 DIAGNOSIS — E039 Hypothyroidism, unspecified: Secondary | ICD-10-CM | POA: Diagnosis not present

## 2024-06-07 DIAGNOSIS — G3184 Mild cognitive impairment, so stated: Secondary | ICD-10-CM | POA: Diagnosis not present

## 2024-06-07 MED ORDER — IRON (FERROUS SULFATE) 325 (65 FE) MG PO TABS
1.0000 | ORAL_TABLET | ORAL | 3 refills | Status: AC
  Filled 2024-06-07: qty 30, 70d supply, fill #0

## 2024-06-07 MED ORDER — TRIAMCINOLONE ACETONIDE 0.5 % EX OINT
1.0000 | TOPICAL_OINTMENT | Freq: Two times a day (BID) | CUTANEOUS | 0 refills | Status: AC
  Filled 2024-06-07: qty 30, 15d supply, fill #0

## 2024-06-07 NOTE — Patient Instructions (Signed)
 Take Over the counter Iron  65 mg Once a day 3/week with Meals

## 2024-06-07 NOTE — Progress Notes (Signed)
 "  Location:  [facility] Service:  Clinic (12)  Provider:   Code Status:  Goals of Care:     05/22/2024    2:24 PM  Advanced Directives  Does Patient Have a Medical Advance Directive? Yes  Type of Advance Directive Out of facility DNR (pink MOST or yellow form)     Chief Complaint  Patient presents with   Medical Management of Chronic Issues    Four Months Follow-up with labs     HPI: Patient is a 89 y.o. female seen today for medical management of chronic diseases.    [place]ives [place] in [facility] with no assist. No Falls Does not drive anymore Acute issue today Bleeding varicose veins Patient was admitted in the hospital 24 hours as she had severe bleeding from her varicose vein her hemoglobin dropped to 8.5 secondary to bleeding.  She had 1 stitch placed by surgery she wanted to know if I can remove the stitch today Since discharge patient has been stable and has not had any issues Rash Noticed macular rash around her thigh area and upper part of her leg.  Patient has recently started using some new compression stockings The rash is not warm but is itchy and is not tender History of CHF Her last echo showed EF of 55 to 60% with mild AR  Patient used to take Entresto  which was put on hold in hospital Probably due to low blood pressure, not sure She has not restarted it yet.  She also is off the Cardizem  by cardiology.  Her rhythm is now controlled only by amiodarone   Mild cognitive impairment Continues to manage well in [place] Chronic neck pain controlled with oxycodone   Past Medical History:  Diagnosis Date   Atrial fibrillation (HCC)    Breast CA (HCC)    Cancer (HCC)    Breast and thyroid  cancer   Glaucoma    Hearing impaired    Hearing aids   Hypertension    Osteopenia 05/2013   T score -1.9 FRAX 15%/4.3%   Thyroid  disease    Cancer    Past Surgical History:  Procedure Laterality Date   APPENDECTOMY     BREAST EXCISIONAL  BIOPSY Right    benign   BREAST LUMPECTOMY     [id number] radiation   BREAST SURGERY  1989   Lumpectomy right breast   CARDIOVERSION N/A 02/14/2016   Procedure: CARDIOVERSION;  Surgeon: Wilbert JONELLE Bihari, MD;  Location: [facility] ENDOSCOPY;  Service: Cardiovascular;  Laterality: N/A;   CARDIOVERSION N/A 08/04/2018   Procedure: CARDIOVERSION;  Surgeon: Lonni Slain, MD;  Location: [facility] ENDOSCOPY;  Service: Cardiovascular;  Laterality: N/A;   CARDIOVERSION N/A 10/31/2019   Procedure: CARDIOVERSION;  Surgeon: Maranda Leim DEL, MD;  Location: [facility] ENDOSCOPY;  Service: Cardiovascular;  Laterality: N/A;   CARDIOVERSION N/A 05/02/2020   Procedure: CARDIOVERSION;  Surgeon: Loni Soyla LABOR, MD;  Location: [facility] ENDOSCOPY;  Service: Cardiovascular;  Laterality: N/A;   CARDIOVERSION N/A 09/10/2020   Procedure: CARDIOVERSION;  Surgeon: Santo Stanly LABOR, MD;  Location: [facility] ENDOSCOPY;  Service: Cardiovascular;  Laterality: N/A;   Mole excised     Benign   THYROID  SURGERY     Removed 1 lobe   TONSILLECTOMY      Allergies[1]  Outpatient Encounter Medications as of 06/07/2024  Medication Sig   amiodarone  (PACERONE ) 200 MG tablet Take 1 tablet (200 mg total) by mouth daily.   ammonium lactate  (AMLACTIN) 12 %  cream Apply a thin layer to affected areas up to twice daily. Avoid face, skin folds, and areas of broken skin. Discontinue if irritation occurs.   apixaban  (ELIQUIS ) 2.5 MG TABS tablet Take 1 tablet (2.5 mg total) by mouth 2 (two) times daily.   beta carotene w/minerals (OCUVITE) tablet Take 1 tablet by mouth at bedtime.   CALCIUM  PO Take 1 tablet by mouth daily.   carboxymethylcellulose (REFRESH PLUS) 0.5 % SOLN Place 1 drop into both eyes 3 (three) times daily as needed (dry/irritated eyes.).    cholecalciferol  (VITAMIN D ) 25 MCG (1000 UNIT) tablet Take 1,000 Units by mouth in the morning. chewables   Emollient (AQUAPHOR ADVANCED THERAPY) OINT Apply 1 Application topically 2 (two) times daily as  needed.   furosemide  (LASIX ) 20 MG tablet Take 3 tablets (60 mg total) by mouth daily. For the next 3 days take Lasix  40 mg twice a day, then continue with 60 mg once a day in the morning.   glucosamine-chondroitin 500-400 MG tablet Take 1 tablet by mouth in the morning and at bedtime.   Iron , Ferrous Sulfate , 325 (65 Fe) MG TABS Take 1 tablet by mouth 3 (three) times a week.   latanoprost  (XALATAN ) 0.005 % ophthalmic solution Place 1 drop into both eyes at bedtime.   levothyroxine  (SYNTHROID ) 100 MCG tablet Take 1 tablet (100 mcg total) by mouth daily before breakfast.   Multiple Vitamin (MULTIVITAMIN WITH MINERALS) TABS tablet Take 1 tablet by mouth daily with lunch. Adults 50+   oxyCODONE  (OXY IR/ROXICODONE ) 5 MG immediate release tablet Take 1 tablet (5 mg total) by mouth 2 (two) times daily. (Patient taking differently: Take 5 mg by mouth at bedtime.)   potassium chloride  (KLOR-CON  M) 10 MEQ tablet Take 1 tablet (10 mEq total) by mouth daily.   [Paused] sacubitril -valsartan  (ENTRESTO ) 24-26 MG Take 1 tablet by mouth 2 (two) times daily.   triamcinolone  ointment (KENALOG ) 0.5 % Apply 1 Application topically 2 (two) times daily.   [DISCONTINUED] levothyroxine  (SYNTHROID ) 100 MCG tablet Take 1 tablet (100 mcg total) by mouth daily before breakfast.   No facility-administered encounter medications on file as of 06/07/2024.    Review of Systems:  Review of Systems  Constitutional:  Negative for activity change and appetite change.  HENT: Negative.    Respiratory:  Negative for cough and shortness of breath.   Cardiovascular:  Positive for leg swelling.  Gastrointestinal:  Negative for constipation.  Genitourinary: Negative.   Musculoskeletal:  Positive for back pain and gait problem. Negative for arthralgias and myalgias.  Skin:  Positive for color change.  Neurological:  Negative for dizziness and weakness.  Psychiatric/Behavioral:  Negative for confusion, dysphoric mood and sleep  disturbance.     Health Maintenance  Topic Date Due   COVID-19 Vaccine (8 - 2025-26 season) 02/01/2025 (Originally 01/24/2024)   Medicare Annual Wellness (AWV)  03/02/2025   Influenza Vaccine  Completed   Bone Density Scan  Completed   Meningococcal B Vaccine  Aged Out   DTaP/Tdap/Td  Discontinued   Pneumococcal Vaccine: 50+ Years  Discontinued   Zoster Vaccines- Shingrix  Discontinued    Physical Exam: Vitals:   06/07/24 1038  BP: 114/68  Pulse: 64  Resp: 18  Temp: 98.3 F (36.8 C)  SpO2: 97%  Weight: 127 lb 9.6 oz (57.9 kg)  Height: 5' 4 (1.626 m)   Body mass index is 21.9 kg/m. Physical Exam Vitals reviewed.  Constitutional:      Appearance: Normal appearance.  HENT:  Head: Normocephalic.     Nose: Nose normal.     Mouth/Throat:     Mouth: Mucous membranes are moist.     Pharynx: Oropharynx is clear.  Eyes:     Pupils: Pupils are equal, round, and reactive to light.  Cardiovascular:     Rate and Rhythm: Normal rate. Rhythm irregular.     Pulses: Normal pulses.     Heart sounds: Normal heart sounds. No murmur heard. Pulmonary:     Effort: Pulmonary effort is normal.     Breath sounds: Normal breath sounds.  Abdominal:     General: Abdomen is flat. Bowel sounds are normal.     Palpations: Abdomen is soft.  Musculoskeletal:        General: Swelling present.     Cervical back: Neck supple.     Comments: Chronic venous changes noticed bilaterally with mild edema   Skin:    General: Skin is warm.     Comments: Patient had 1 stitch on her Left Foot  Neurological:     General: No focal deficit present.     Mental Status: She is alert and oriented to person, place, and time.  Psychiatric:        Mood and Affect: Mood normal.        Thought Content: Thought content normal.     Labs reviewed: Basic Metabolic Panel: Recent Labs    01/11/24 0945 01/11/24 0945 02/07/24 0800 05/14/24 0156 05/22/24 1519 05/22/24 1520 06/05/24 0803  NA 142   < > 140  141  --  142 137  K 4.3  --  3.8 3.8  --  3.5 3.8  CL 99  --  100 105  --  105 100  CO2 24  --  33* 28  --  29 30  GLUCOSE 67*   < > 73 131*  --  88 89  BUN 20   < > 25 29*  --  23 25  CREATININE 1.07*  --  0.99* 1.17*  --  1.08* 0.98*  CALCIUM  8.9  --  8.8 8.0*  --  8.2* 8.5*  MG  --   --   --   --  2.1  --   --   TSH 3.150  --  2.87  --   --   --  3.96   < > = values in this interval not displayed.   Liver Function Tests: Recent Labs    01/11/24 0945 02/07/24 0800 06/05/24 0803  AST 23 18 17   ALT 11 11 10   ALKPHOS 81  --   --   BILITOT 0.5 0.4 0.4  PROT 7.3 7.0 6.9  ALBUMIN 4.1  --   --    No results for input(s): LIPASE, AMYLASE in the last 8760 hours. No results for input(s): AMMONIA in the last 8760 hours. CBC: Recent Labs    05/14/24 0156 05/14/24 0444 05/15/24 0256 05/22/24 1520 06/05/24 0803  WBC 12.8*  --  7.3 6.6 5.2  NEUTROABS 10.0*  --   --  4.7 3,078  HGB 9.5*   < > 8.6* 9.1* 9.2*  HCT 29.8*   < > 26.5* 28.6* 29.9*  MCV 89.8  --  88.9 89.1 85.9  PLT PLATELET CLUMPS NOTED ON SMEAR, COUNT APPEARS ADEQUATE  --  PLATELET CLUMPS NOTED ON SMEAR, UNABLE TO ESTIMATE PLATELET CLUMPS NOTED ON SMEAR, UNABLE TO ESTIMATE CANCELED   < > = values in this interval not displayed.   Lipid Panel:  Recent Labs    02/07/24 0800 06/05/24 0803  CHOL 187 165  HDL 77 76  LDLCALC 96 76  TRIG 57 51  CHOLHDL 2.4 2.2   No results found for: HGBA1C  Procedures since last visit: DG Chest Port 1 View Result Date: 05/22/2024 CLINICAL DATA:  Weakness and tachycardia. EXAM: PORTABLE CHEST 1 VIEW COMPARISON:  Chest CT from 2021. FINDINGS: The cardiac silhouette, mediastinal and hilar contours are within normal limits for the patient's age. Right paraspinal density consistent with known neurogenic tumor as seen on prior imaging studies and most consistent with a schwannoma or neurofibroma. Focal eventration of the right hemidiaphragm. No infiltrates, edema or effusions. The  bony thorax is intact. IMPRESSION: 1. No acute cardiopulmonary findings. 2. Right paraspinal density consistent with known neurogenic tumor. Electronically Signed   By: MYRTIS Stammer M.D.   On: 05/22/2024 17:20    Assessment/Plan 1. Paroxysmal atrial fibrillation (HCC) (Primary) On amiodarone  Cardizem  was stopped by cardiology Recent TSH was normal Also on Eliquis  2. Chronic systolic congestive heart failure (HCC) Continues on Lasix  Her Entresto  was stopped in the hospital She has appointment with cardiology Now cannot be started till she sees the cardiologist  3. Encounter for removal of sutures The suture was removed  4. Acquired hypothyroidism TSH was normal in the hospital  5. Mild cognitive impairment [place]  6. Rash and nonspecific skin eruption Patient will avoid using the new compression stockings Triamcinolone  Cream PRn 7 Anemia Due to Blood Loss Start Iron  for 3/ week   Labs/tests ordered:   Next appt:  09/06/2024         [1]  Allergies Allergen Reactions   Combigan [Brimonidine Tartrate-Timolol] Itching and Other (See Comments)    Red eye    Pneumococcal Vaccines Other (See Comments)    Pain in arms/legs--nerve issues   "

## 2024-06-08 NOTE — Telephone Encounter (Signed)
 Noted

## 2024-06-19 ENCOUNTER — Other Ambulatory Visit: Payer: Self-pay | Admitting: Adult Health

## 2024-06-19 ENCOUNTER — Other Ambulatory Visit (HOSPITAL_COMMUNITY): Payer: Self-pay

## 2024-06-20 ENCOUNTER — Ambulatory Visit: Admitting: Cardiology

## 2024-06-20 ENCOUNTER — Other Ambulatory Visit (HOSPITAL_COMMUNITY): Payer: Self-pay

## 2024-06-20 ENCOUNTER — Other Ambulatory Visit: Payer: Self-pay

## 2024-06-20 MED ORDER — OXYCODONE HCL 5 MG PO TABS
5.0000 mg | ORAL_TABLET | Freq: Two times a day (BID) | ORAL | 0 refills | Status: DC
  Filled 2024-06-20: qty 60, 30d supply, fill #0

## 2024-06-20 NOTE — Telephone Encounter (Signed)
 Patient is requesting a refill of the following medications: Requested Prescriptions   Pending Prescriptions Disp Refills   oxyCODONE  (OXY IR/ROXICODONE ) 5 MG immediate release tablet 60 tablet 0    Sig: Take 1 tablet (5 mg total) by mouth 2 (two) times daily.    Date of last refill: 04/10/2024  Refill amount: 60 tabs 0 refills  Treatment agreement date: 02/08/2024

## 2024-06-21 ENCOUNTER — Other Ambulatory Visit: Payer: Self-pay

## 2024-06-21 ENCOUNTER — Other Ambulatory Visit (HOSPITAL_COMMUNITY): Payer: Self-pay

## 2024-06-21 MED ORDER — OXYCODONE HCL 5 MG PO TABS: 5.0000 mg | ORAL_TABLET | Freq: Two times a day (BID) | ORAL | 0 refills | Status: AC

## 2024-06-21 NOTE — Telephone Encounter (Signed)
 Patient came in to asked Dr. Charlanne Fredia CROME, MD if she can send a prescpriton to the pharmacy [facility] for the following medication:  Patient is requesting a refill of the following medications: Requested Prescriptions   Pending Prescriptions Disp Refills   oxyCODONE  (OXY IR/ROXICODONE ) 5 MG immediate release tablet 60 tablet 0    Sig: Take 1 tablet (5 mg total) by mouth 2 (two) times daily.    Date of last refill: 06/21/2024  Refill amount: 60/0  Treatment agreement date: 02/08/2024

## 2024-06-22 ENCOUNTER — Ambulatory Visit: Attending: Student | Admitting: Student

## 2024-06-22 ENCOUNTER — Encounter: Payer: Self-pay | Admitting: Student

## 2024-06-22 ENCOUNTER — Other Ambulatory Visit: Payer: Self-pay

## 2024-06-22 ENCOUNTER — Other Ambulatory Visit (HOSPITAL_COMMUNITY): Payer: Self-pay

## 2024-06-22 VITALS — BP 110/68 | HR 116 | Ht 64.0 in | Wt 129.6 lb

## 2024-06-22 DIAGNOSIS — I4819 Other persistent atrial fibrillation: Secondary | ICD-10-CM | POA: Diagnosis present

## 2024-06-22 DIAGNOSIS — Z79899 Other long term (current) drug therapy: Secondary | ICD-10-CM | POA: Insufficient documentation

## 2024-06-22 DIAGNOSIS — D6869 Other thrombophilia: Secondary | ICD-10-CM | POA: Insufficient documentation

## 2024-06-22 MED ORDER — DILTIAZEM HCL ER COATED BEADS 180 MG PO CP24
180.0000 mg | ORAL_CAPSULE | Freq: Every day | ORAL | 3 refills | Status: AC
  Filled 2024-06-22: qty 90, 90d supply, fill #0

## 2024-06-22 NOTE — Progress Notes (Signed)
" °  Electrophysiology Office Note:   Date:  06/22/2024  ID:  Laura Lee, DOB 1932/02/04, MRN [id number]  Primary Cardiologist: None Electrophysiologist: Will Gladis Norton, MD   Electrophysiologist:  Soyla Gladis Norton, MD      History of Present Illness:   Laura Lee is a 89 y.o. female with h/o  persistent AF on amiodarone , chronic diastolic CHF, HTN, and deconditioning seen today for acute visit due to paroxysmal AF.    Patient reports doing OK overall, but during recent hospital visit for burst varicose vein noted to have intermittent AF. Has been taking medications as directed, but amiodarone  only 5 days a week. Entresto  held that admission and has not been resume. She is out of rhythm today in a flutter with rapid rates. Denies palpitations or SOB. She has chronic fatigue that was not improved by stopping diltiazem . No chest pain or syncope. No edema.   Review of systems complete and found to be negative unless listed in HPI.   EP Information / Studies Reviewed:    EKG is ordered today. Personal review as below.  EKG Interpretation Date/Time:  Thursday June 22 2024 11:58:13 EST Ventricular Rate:  116 PR Interval:    QRS Duration:  134 QT Interval:  320 QTC Calculation: 444 R Axis:   204  Text Interpretation: Atrial flutter with variable A-V block Right superior axis deviation Non-specific intra-ventricular conduction block Confirmed by Lesia Heck ([id number]) on 06/22/2024 12:02:11 PM    Arrhythmia/Device History No specialty comments available.   Physical Exam:   VS:  BP 110/68   Pulse (!) 116   Ht 5' 4 (1.626 m)   Wt 129 lb 9.6 oz (58.8 kg)   SpO2 97%   BMI 22.25 kg/m    Wt Readings from Last 3 Encounters:  06/22/24 129 lb 9.6 oz (58.8 kg)  06/07/24 127 lb 9.6 oz (57.9 kg)  05/22/24 127 lb 13.9 oz (58 kg)     GEN: No acute distress NECK: No JVD; No carotid bruits CARDIAC: Irregularly irregular rate and rhythm, no murmurs, rubs, gallops RESPIRATORY:   Clear to auscultation without rales, wheezing or rhonchi  ABDOMEN: Soft, non-tender, non-distended EXTREMITIES:  No edema; No deformity   ASSESSMENT AND PLAN:    Persistent Atrial fibrillation Atrial Flutter Secondary hypercoagulable state EKG today shows atrial flutter with poorly controlled rate Increase amiodarone  to 200 mg daily Add back diltiazem  180 mg daily Discussed possible need for cardioversion vs rate control alone if we can get HR's down.   HFrecoveredEF Felt to be tachy-mediated Keep off Entresto  with hypotension.  Consider adding back losartan once adjusted to med changes today.  HTN Stable on current regimen   High risk medication monitoring - Amiodarone  Patient requires ongoing monitoring for anti-arrhythmic medication which has the potential to cause life threatening arrhythmias or AV block.    Follow up with Afib Clinic in 2-3 weeks.  Signed, Ozell Prentice Lesia, PA-C  "

## 2024-06-22 NOTE — Patient Instructions (Signed)
 Medication Instructions:  Change amiodarone  200 mg to once DAILY Add diltiazem  180 mg DAILY STOP Entresto  *If you need a refill on your cardiac medications before your next appointment, please call your pharmacy*  Lab Work: No labwork ordered today. If you have labs (blood work) drawn today and your tests are completely normal, you will receive your results only by: MyChart Message (if you have MyChart) OR A paper copy in the mail If you have any lab test that is abnormal or we need to change your treatment, we will call you to review the results.  Testing/Procedures: No testing ordered today  Follow-Up: At [facility], you and your health needs are our priority.  As part of our continuing mission to provide you with exceptional heart care, our providers are all part of one team.  This team includes your primary Cardiologist (physician) and Advanced Practice Providers or APPs (Physician Assistants and Nurse Practitioners) who all work together to provide you with the care you need, when you need it.  Your next appointment:   AF clinic in 2-3 weeks  We recommend signing up for the patient portal called MyChart.  Sign up information is provided on this After Visit Summary.  MyChart is used to connect with patients for Virtual Visits (Telemedicine).  Patients are able to view lab/test results, encounter notes, upcoming appointments, etc.  Non-urgent messages can be sent to your provider as well.   To learn more about what you can do with MyChart, go to [email or web address].

## 2024-07-11 ENCOUNTER — Ambulatory Visit (HOSPITAL_COMMUNITY): Admitting: Physician Assistant

## 2024-09-06 ENCOUNTER — Encounter: Admitting: Internal Medicine
